# Patient Record
Sex: Female | Born: 1995 | Race: White | Hispanic: No | Marital: Married | Smoking: Never smoker
Health system: Southern US, Community
[De-identification: ages and names within clinical notes are randomized; demographics above are authoritative.]

## PROBLEM LIST (undated history)

## (undated) DIAGNOSIS — Z8759 Personal history of other complications of pregnancy, childbirth and the puerperium: Secondary | ICD-10-CM

## (undated) DIAGNOSIS — Z8616 Personal history of COVID-19: Secondary | ICD-10-CM

## (undated) DIAGNOSIS — Z9889 Other specified postprocedural states: Secondary | ICD-10-CM

## (undated) DIAGNOSIS — I10 Essential (primary) hypertension: Secondary | ICD-10-CM

## (undated) DIAGNOSIS — O139 Gestational [pregnancy-induced] hypertension without significant proteinuria, unspecified trimester: Secondary | ICD-10-CM

## (undated) DIAGNOSIS — J4520 Mild intermittent asthma, uncomplicated: Secondary | ICD-10-CM

## (undated) HISTORY — DX: Other specified postprocedural states: Z98.890

## (undated) HISTORY — PX: WISDOM TOOTH EXTRACTION: SHX21

## (undated) HISTORY — PX: TYMPANOSTOMY TUBE PLACEMENT: SHX32

## (undated) HISTORY — DX: Personal history of COVID-19: Z86.16

## (undated) HISTORY — DX: Personal history of other complications of pregnancy, childbirth and the puerperium: Z87.59

## (undated) HISTORY — DX: Mild intermittent asthma, uncomplicated: J45.20

## (undated) HISTORY — DX: Gestational (pregnancy-induced) hypertension without significant proteinuria, unspecified trimester: O13.9

## (undated) HISTORY — DX: Essential (primary) hypertension: I10

---

## 2018-04-08 ENCOUNTER — Encounter: Payer: Self-pay | Admitting: Family Medicine

## 2018-04-08 ENCOUNTER — Ambulatory Visit (INDEPENDENT_AMBULATORY_CARE_PROVIDER_SITE_OTHER): Payer: Managed Care, Other (non HMO) | Admitting: Family Medicine

## 2018-04-08 VITALS — BP 127/81 | HR 73 | Ht 70.0 in | Wt 224.0 lb

## 2018-04-08 DIAGNOSIS — S39012A Strain of muscle, fascia and tendon of lower back, initial encounter: Secondary | ICD-10-CM | POA: Diagnosis not present

## 2018-04-08 DIAGNOSIS — J4520 Mild intermittent asthma, uncomplicated: Secondary | ICD-10-CM | POA: Diagnosis not present

## 2018-04-08 HISTORY — DX: Mild intermittent asthma, uncomplicated: J45.20

## 2018-04-08 MED ORDER — ALBUTEROL SULFATE HFA 108 (90 BASE) MCG/ACT IN AERS: 2.0000 | INHALATION_SPRAY | Freq: Four times a day (QID) | RESPIRATORY_TRACT | 0 refills | Status: AC | PRN

## 2018-04-08 MED ORDER — CYCLOBENZAPRINE HCL 5 MG PO TABS: 5.0000 mg | ORAL_TABLET | Freq: Three times a day (TID) | ORAL | 1 refills | Status: DC | PRN

## 2018-04-08 NOTE — Patient Instructions (Addendum)
Thank you for coming in today. Attend PT.  Use a heating pad and TENS unit.   Recheck if not improving especially in 4 weeks or so.   Schedule a new patient visit with Dr Lyn Hollingshead or Vinetta Bergamo.   Lumbosacral Strain Lumbosacral strain is an injury that causes pain in the lower back (lumbosacral spine). This injury usually occurs from overstretching the muscles or ligaments along your spine. A strain can affect one or more muscles or cord-like tissues that connect bones to other bones (ligaments). What are the causes? This condition may be caused by:  A hard, direct hit (blow) to the back.  Excessive stretching of the lower back muscles. This may result from: ? A fall. ? Lifting something heavy. ? Repetitive movements such as bending or crouching.  What increases the risk? The following factors may increase your risk of getting this condition:  Participating in sports or activities that involve: ? A sudden twist of the back. ? Pushing or pulling motions.  Being overweight or obese.  Having poor strength and flexibility, especially tight hamstrings or weak muscles in the back or abdomen.  Having too much of a curve in the lower back.  Having a pelvis that is tilted forward.  What are the signs or symptoms? The main symptom of this condition is pain in the lower back, at the site of the strain. Pain may extend (radiate) down one or both legs. How is this diagnosed? This condition is diagnosed based on:  Your symptoms.  Your medical history.  A physical exam. ? Your health care provider may push on certain areas of your back to determine the source of your pain. ? You may be asked to bend forward, backward, and side to side to assess the severity of your pain and your range of motion.  Imaging tests, such as: ? X-rays. ? MRI.  How is this treated? Treatment for this condition may include:  Putting heat and cold on the affected area.  Medicines to help relieve pain  and relax your muscles (muscle relaxants).  NSAIDs to help reduce swelling and discomfort.  When your symptoms improve, it is important to gradually return to your normal routine as soon as possible to reduce pain, avoid stiffness, and avoid loss of muscle strength. Generally, symptoms should improve within 6 weeks of treatment. However, recovery time varies. Follow these instructions at home: Managing pain, stiffness, and swelling   If directed, put ice on the injured area during the first 24 hours after your strain. ? Put ice in a plastic bag. ? Place a towel between your skin and the bag. ? Leave the ice on for 20 minutes, 2-3 times a day.  If directed, put heat on the affected area as often as told by your health care provider. Use the heat source that your health care provider recommends, such as a moist heat pack or a heating pad. ? Place a towel between your skin and the heat source. ? Leave the heat on for 20-30 minutes. ? Remove the heat if your skin turns bright red. This is especially important if you are unable to feel pain, heat, or cold. You may have a greater risk of getting burned. Activity  Rest and return to your normal activities as told by your health care provider. Ask your health care provider what activities are safe for you.  Avoid activities that take a lot of energy for as long as told by your health care provider. General instructions  Take over-the-counter and prescription medicines only as told by your health care provider.  Donot drive or use heavy machinery while taking prescription pain medicine.  Do not use any products that contain nicotine or tobacco, such as cigarettes and e-cigarettes. If you need help quitting, ask your health care provider.  Keep all follow-up visits as told by your health care provider. This is important. How is this prevented?  Use correct form when playing sports and lifting heavy objects.  Use good posture when sitting  and standing.  Maintain a healthy weight.  Sleep on a mattress with medium firmness to support your back.  Be safe and responsible while being active to avoid falls.  Do at least 150 minutes of moderate-intensity exercise each week, such as brisk walking or water aerobics. Try a form of exercise that takes stress off your back, such as swimming or stationary cycling.  Maintain physical fitness, including: ? Strength. ? Flexibility. ? Cardiovascular fitness. ? Endurance. Contact a health care provider if:  Your back pain does not improve after 6 weeks of treatment.  Your symptoms get worse. Get help right away if:  Your back pain is severe.  You cannot stand or walk.  You have difficulty controlling when you urinate or when you have a bowel movement.  You feel nauseous or you vomit.  Your feet get very cold.  You have numbness, tingling, weakness, or problems using your arms or legs.  You develop any of the following: ? Shortness of breath. ? Dizziness. ? Pain in your legs. ? Weakness in your buttocks or legs. ? Discoloration of the skin on your toes or legs. This information is not intended to replace advice given to you by your health care provider. Make sure you discuss any questions you have with your health care provider. Document Released: 02/26/2005 Document Revised: 12/07/2015 Document Reviewed: 10/21/2015 Elsevier Interactive Patient Education  [employer].

## 2018-04-08 NOTE — Progress Notes (Signed)
Subjective:    CC: lower back pain  HPI: Carla Hall (maiden name, Larry Sierras) is a 22 year old female, with a past medical history of asthma, who presents today for low back pain since Sunday afternoon. She has a history of occasional lower back pain, for which she goes to the chiropractor once per month. On Sunday afternoon, she was lifting weights at [employer] and hurt her back. She was barely able to stand afterwards. She went to the chiropractor on Monday, and he told her she likely had a muscle strain. The pain is worse when sitting. 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 Her sleep has not been affected by the pain.   She has used iced, heat, and Aleve without relief. She denies radicular symptoms, dysuria, urinary frequency, and changes in bowel movements.  She would also like to establish care here.  Past medical history, Surgical history, Family history not pertinant except as noted below, Social history, Allergies, and medications have been entered into the medical record, reviewed, and no changes needed.   Review of Systems: No headache, visual changes, nausea, vomiting, diarrhea, constipation, dizziness, abdominal pain, skin rash, fevers, chills, night sweats, weight loss, swollen lymph nodes, body aches, joint swelling, muscle aches, chest pain, shortness of breath, mood changes, visual or auditory hallucinations.   Objective:    Vitals:   04/08/18 0859  BP: 127/81  Pulse: 73   General: Well Developed, well nourished, and in no acute distress.  Neuro/Psych: Alert and oriented x3, extra-ocular muscles intact, able to move all 4 extremities, sensation grossly intact. Skin: Warm and dry, no rashes noted.  Respiratory: Not using accessory muscles, speaking in full sentences, trachea midline.  Cardiovascular: Pulses palpable, no extremity edema. Abdomen: Does not appear distended. MSK:  Low back: Tender to  palpation bilaterally near SI joints. Nontender to spinal midline Very limited flexion and extension due to pain. Limited lateral rotation and limited rotational range of motion bilaterally. Intact lower extremity strength throughout.  Reflexes are equal normal bilaterally.  Normal gait.   Impression and Recommendations:    Assessment and Plan: 22 y.o. female with  Lower back pain: Hermine had onset of lower back pain on Sunday after working out. She most likely has lumbosacral strain based on symptoms and physical exam findings. No concern for fractures. Advised pt to attend PT and start a core routine to warm up before doing [employer] work-outs. Also recommended heating pad and TENS unit. Follow-up in ~4 weeks if no improvement.   Asthma: Will refill her albuterol inhaler. Advised pt that she can use this before exercise if needed.  She will schedule establish care visit with Dr. Lyn Hollingshead or Rosita Kea PA-C in the near future.  I sent medical records request to [facility] and have found vaccine history through [facility].  Patient recently was married and her maiden name was Hanes  Orders Placed This Encounter  Procedures  . Ambulatory referral to Physical Therapy    Referral Priority:   Routine    Referral Type:   Physical Medicine    Referral Reason:   Specialty Services Required    Requested Specialty:   Physical Therapy   Meds ordered this encounter  Medications  . albuterol (PROVENTIL HFA;VENTOLIN HFA) 108 (90 Base) MCG/ACT inhaler    Sig: Inhale 2 puffs into the lungs every 6 (six) hours as needed for wheezing or shortness of breath.  Dispense:  1 Inhaler    Refill:  0  . cyclobenzaprine (FLEXERIL) 5 MG tablet    Sig: Take 1-2 tablets (5-10 mg total) by mouth 3 (three) times daily as needed for muscle spasms.    Dispense:  30 tablet    Refill:  1    Discussed warning signs or symptoms. Please see discharge instructions. Patient expresses understanding.  I  personally was present and performed or re-performed the history, physical exam and medical decision-making activities of this service and have verified that the service and findings are accurately documented in the student's note. ___________________________________________ Clementeen Graham M.D., ABFM., CAQSM. Primary Care and Sports Medicine Adjunct Instructor of Family Medicine  [employer]

## 2018-04-15 ENCOUNTER — Ambulatory Visit (INDEPENDENT_AMBULATORY_CARE_PROVIDER_SITE_OTHER): Payer: Managed Care, Other (non HMO) | Admitting: Osteopathic Medicine

## 2018-04-15 ENCOUNTER — Other Ambulatory Visit (HOSPITAL_COMMUNITY)
Admission: RE | Admit: 2018-04-15 | Discharge: 2018-04-15 | Disposition: A | Discharge status: Home / Self Care | Payer: Managed Care, Other (non HMO) | Source: Ambulatory Visit | Attending: Osteopathic Medicine | Admitting: Osteopathic Medicine

## 2018-04-15 ENCOUNTER — Encounter: Payer: Self-pay | Admitting: Osteopathic Medicine

## 2018-04-15 VITALS — BP 131/71 | HR 92 | Temp 98.2°F | Ht 70.0 in | Wt 224.4 lb

## 2018-04-15 DIAGNOSIS — Z124 Encounter for screening for malignant neoplasm of cervix: Secondary | ICD-10-CM | POA: Insufficient documentation

## 2018-04-15 DIAGNOSIS — Z23 Encounter for immunization: Secondary | ICD-10-CM | POA: Diagnosis not present

## 2018-04-15 DIAGNOSIS — Z0000 Encounter for general adult medical examination without abnormal findings: Secondary | ICD-10-CM | POA: Diagnosis not present

## 2018-04-15 NOTE — Patient Instructions (Signed)
General Preventive Care  Most recent routine screening lipids/other labs: ordered. Cholesterol and Diabetes screening usually recommended every few years.   Everyone should have blood pressure checked once per year.   Tobacco: don't! Alcohol: responsible moderation is ok for most adults - if you have concerns about your alcohol intake, please talk to me! Recreational/Illicit Drugs: don't!  Exercise: as tolerated to reduce risk of cardiovascular disease and diabetes. Strength training will also prevent osteoporosis.   Mental health: if need for mental health care (medicines, counseling, other), or concerns about moods, please let me know!   Sexual health: if need for STD testing, or if concerns with libido/pain problems, please let me know! If you need to discuss your birth control options, please let me know!  Vaccines  Flu vaccine:thanks for getting your flu shot!   Shingles vaccine: Shingrix recommended after age 24   Pneumonia vaccines: Prevnar and Pneumovax recommended after age 24  Tetanus booster: Tdap recommended every 10 years - will request records  Cancer screenings   Colon cancer screening: recommended for everyone at age 24, but some folks need a colonoscopy sooner if risk factors   Breast cancer screening: mammogram recommended at age 24 every other year at least, and annually after age 24.   Cervical cancer screening: every 1 to 5 years depending on age and other risk factors. Can stop at age 24 or w/ hysterectomy as long as previous testing was normal.   Lung cancer screening: not needed for non-smokers Infection screenings . HIV & Gonorrhea/Chlamydia: screening as needed . Hepatitis C: recommended for anyone born [id number] - not needed for you!  . TB: certain at-risk populations, or depending on work requirements and/or travel history Other . Bone Density Test: recommended for women at age 10065, men at age 24, sooner depending on risk factors . Advanced Directive:  Living Will and/or Healthcare Power of Attorney recommended for all adults, regardless of age or health!

## 2018-04-15 NOTE — Progress Notes (Signed)
HPI: Carla Hall is a 22 y.o. female who  has a past medical history of Mild intermittent asthma (04/08/2018).  she presents to [facility] today, 04/15/18,  for chief complaint of: New to establish Annual Physical   Patient here for annual physical / wellness exam.  See preventive care reviewed as below.   Additional concerns today include: None    Past medical, surgical, social and family history reviewed:  Patient Active Problem List   Diagnosis Date Noted  . Mild intermittent asthma 04/08/2018    Past Surgical History:  Procedure Laterality Date  . WISDOM TOOTH EXTRACTION      Social History   Tobacco Use  . Smoking status: Never Smoker  . Smokeless tobacco: Never Used  Substance Use Topics  . Alcohol use: Yes    Comment: 1/WK    Family History  Problem Relation Age of Onset  . Diabetes Mellitus I Sister      Current medication list and allergy/intolerance information reviewed:    Current Outpatient Medications  Medication Sig Dispense Refill  . albuterol (PROVENTIL HFA;VENTOLIN HFA) 108 (90 Base) MCG/ACT inhaler Inhale 2 puffs into the lungs every 6 (six) hours as needed for wheezing or shortness of breath. 1 Inhaler 0  . cyclobenzaprine (FLEXERIL) 5 MG tablet Take 1-2 tablets (5-10 mg total) by mouth 3 (three) times daily as needed for muscle spasms. 30 tablet 1   No current facility-administered medications for this visit.     Allergies  Allergen Reactions  . Penicillins       Review of Systems:  Constitutional:  No  fever, no chills, No recent illness, No unintentional weight changes. No significant fatigue.   HEENT: No  headache, no vision change, no hearing change, No sore throat, No  sinus pressure  Cardiac: No  chest pain, No  pressure, No palpitations, No  Orthopnea  Respiratory:  No  shortness of breath. No  Cough  Gastrointestinal: No  abdominal pain, No  nausea, No  vomiting,  No  blood in  stool, No  diarrhea, No  constipation   Musculoskeletal: No new myalgia/arthralgia  Skin: No  Rash, No other wounds/concerning lesions  Genitourinary: No  incontinence, No  abnormal genital bleeding, No abnormal genital discharge  Hem/Onc: No  easy bruising/bleeding, No  abnormal lymph node  Endocrine: No cold intolerance,  No heat intolerance. No polyuria/polydipsia/polyphagia   Neurologic: No  weakness, No  dizziness, No  slurred speech/focal weakness/facial droop  Psychiatric: No  concerns with depression, No  concerns with anxiety, No sleep problems, No mood problems  Exam:  BP 131/71 (BP Location: Left Arm, Patient Position: Sitting, Cuff Size: Normal)   Pulse 92   Temp 98.2 F (36.8 C) (Oral)   Ht 5\' 10"  (1.778 m)   Wt 224 lb 6.4 oz (101.8 kg)   LMP 04/05/2018   BMI 32.20 kg/m   Constitutional: VS see above. General Appearance: alert, well-developed, well-nourished, NAD  Eyes: Normal lids and conjunctive, non-icteric sclera  Ears, Nose, Mouth, Throat: MMM, Normal external inspection ears/nares/mouth/lips/gums. TM normal bilaterally. Pharynx/tonsils no erythema, no exudate. Nasal mucosa normal.   Neck: No masses, trachea midline. No thyroid enlargement. No tenderness/mass appreciated. No lymphadenopathy  Respiratory: Normal respiratory effort. no wheeze, no rhonchi, no rales  Cardiovascular: S1/S2 normal, no murmur, no rub/gallop auscultated. RRR. No lower extremity edema.  Gastrointestinal: Nontender, no masses. No hepatomegaly, no splenomegaly. No hernia appreciated. Bowel sounds normal. Rectal exam deferred.   Musculoskeletal: Gait  normal. No clubbing/cyanosis of digits.   Neurological: Normal balance/coordination. No tremor. No cranial nerve deficit on limited exam. Motor and sensation intact and symmetric. Cerebellar reflexes intact.   Skin: warm, dry, intact. No rash/ulcer. No concerning nevi or subq nodules on limited exam.    Psychiatric: Normal  judgment/insight. Normal mood and affect. Oriented x3.  GYN: No lesions/ulcers to external genitalia, normal urethra, normal vaginal mucosa, physiologic discharge, cervix normal without lesions, uterus not enlarged or tender, adnexa no masses and nontender  BREAST: No rashes/skin changes, normal fibrous breast tissue, no masses or tenderness, normal nipple without discharge, normal axilla       ASSESSMENT/PLAN:   Annual physical exam - Plan: Cytology - PAP, CBC, COMPLETE METABOLIC PANEL WITH GFR, Lipid panel  Need for influenza vaccination - Plan: Flu Vaccine QUAD 6+ mos PF IM (Fluarix Quad PF)  Cervical cancer screening - Plan: Cytology - PAP    Patient Instructions  General Preventive Care  Most recent routine screening lipids/other labs: ordered. Cholesterol and Diabetes screening usually recommended every few years.   Everyone should have blood pressure checked once per year.   Tobacco: don't! Alcohol: responsible moderation is ok for most adults - if you have concerns about your alcohol intake, please talk to me! Recreational/Illicit Drugs: don't!  Exercise: as tolerated to reduce risk of cardiovascular disease and diabetes. Strength training will also prevent osteoporosis.   Mental health: if need for mental health care (medicines, counseling, other), or concerns about moods, please let me know!   Sexual health: if need for STD testing, or if concerns with libido/pain problems, please let me know! If you need to discuss your birth control options, please let me know!  Vaccines  Flu vaccine:thanks for getting your flu shot!   Shingles vaccine: Shingrix recommended after age 24   Pneumonia vaccines: Prevnar and Pneumovax recommended after age 24  Tetanus booster: Tdap recommended every 10 years - will request records  Cancer screenings   Colon cancer screening: recommended for everyone at age 24, but some folks need a colonoscopy sooner if risk factors   Breast  cancer screening: mammogram recommended at age 24 every other year at least, and annually after age 24.   Cervical cancer screening: every 1 to 5 years depending on age and other risk factors. Can stop at age 24 or w/ hysterectomy as long as previous testing was normal.   Lung cancer screening: not needed for non-smokers Infection screenings . HIV & Gonorrhea/Chlamydia: screening as needed . Hepatitis C: recommended for anyone born [id number] - not needed for you!  . TB: certain at-risk populations, or depending on work requirements and/or travel history Other . Bone Density Test: recommended for women at age 24, men at age 24, sooner depending on risk factors . Advanced Directive: Living Will and/or Healthcare Power of Attorney recommended for all adults, regardless of age or health!     Visit summary with medication list and pertinent instructions was printed for patient to review. All questions at time of visit were answered - patient instructed to contact office with any additional concerns. ER/RTC precautions were reviewed with the patient.   Follow-up plan: Return in about 1 year (around 04/16/2019), or sooner if needed , for [employer] [facility].   Please note: voice recognition software was used to produce this document, and typos may escape review. Please contact Dr. Lyn HollingsheadAlexander for any needed clarifications.

## 2018-04-16 ENCOUNTER — Encounter: Payer: Self-pay | Admitting: Rehabilitative and Restorative Service Providers"

## 2018-04-16 ENCOUNTER — Ambulatory Visit: Payer: Managed Care, Other (non HMO) | Admitting: Rehabilitative and Restorative Service Providers"

## 2018-04-16 DIAGNOSIS — M545 Low back pain, unspecified: Secondary | ICD-10-CM

## 2018-04-16 DIAGNOSIS — R29898 Other symptoms and signs involving the musculoskeletal system: Secondary | ICD-10-CM | POA: Diagnosis not present

## 2018-04-16 LAB — CYTOLOGY - PAP: DIAGNOSIS: NEGATIVE

## 2018-04-16 NOTE — Patient Instructions (Addendum)
Abdominal Bracing With Pelvic Floor (Hook-Lying)    With neutral spine, tighten pelvic floor and abdominals sucking belly button to back bone; tighten muscles in the low back at waist; exhale slowly to the count of 10 Repeat _10__ times. Do _several __ times a day.   Cat / Cow Flow    Inhale, press spine toward ceiling like a Halloween cat. Keeping strength in arms and abdominals, exhale to soften spine through neutral and into cow pose. Open chest and arch back. Initiate movement between cat and cow at tailbone, one vertebrae at a time. Repeat __3-5 __ times.   BACK: Child's Pose (Sciatica)    Sit in knee-chest position and reach arms forward. Separate knees for comfort. Hold position for _30 sec 3__ times. Do _2__ times per day.   Side Waist Stretch from Child's Pose    From child's pose, walk hands to left. Reach right hand out on diagonal. Reach hips back toward heels making a C with torso. Breathe into right side waist. Hold for _20-30 seconds. Repeat __2-3__ times each side.   Bridge  NO PAIN     Tighten core. Focus on engaging posterior hip muscles. Hold for _5 seconds. Repeat __5 __ times.   Side Bend, Sitting FEET ON FLOOR    Sit cross-legged on floor. Bend to one side, touching elbow to floor. Hold _20-30__ seconds. To increase stretch, raise other arm above head.  Repeat _3__ times per session. Do _2-3__ sessions per day.   TENS UNIT: This is helpful for muscle pain and spasm.   Search and Purchase a TENS 7000 2nd edition at www.tenspros.com. It should be less than $30.     TENS unit instructions: Do not shower or bathe with the unit on Turn the unit off before removing electrodes or batteries If the electrodes lose stickiness add a drop of water to the electrodes after they are disconnected from the unit and place on plastic sheet. If you continued to have difficulty, call the TENS unit company to purchase more electrodes. Do not apply lotion  on the skin area prior to use. Make sure the skin is clean and dry as this will help prolong the life of the electrodes. After use, always check skin for unusual red areas, rash or other skin difficulties. If there are any skin problems, does not apply electrodes to the same area. Never remove the electrodes from the unit by pulling the wires. Do not use the TENS unit or electrodes other than as directed. Do not change electrode placement without consultating your therapist or physician. Keep 2 fingers with between each electrode.   Sleeping on Back  Place pillow under knees. A pillow with cervical support and a roll around waist are also helpful. Copyright  VHI. All rights reserved.  Sleeping on Side Place pillow between knees. Use cervical support under neck and a roll around waist as needed. Copyright  VHI. All rights reserved.   Sleeping on Stomach   If this is the only desirable sleeping position, place pillow under lower legs, and under stomach or chest as needed.  Posture - Sitting   Sit upright, head facing forward. Try using a roll to support lower back. Keep shoulders relaxed, and avoid rounded back. Keep hips level with knees. Avoid crossing legs for long periods. Stand to Sit / Sit to Stand   To sit: Bend knees to lower self onto front edge of chair, then scoot back on seat. To stand: Reverse sequence by  placing one foot forward, and scoot to front of seat. Use rocking motion to stand up.   Work Height and Reach  Ideal work height is no more than 2 to 4 inches below elbow level when standing, and at elbow level when sitting. Reaching should be limited to arm's length, with elbows slightly bent.  Bending  Bend at hips and knees, not back. Keep feet shoulder-width apart.    Posture - Standing   Good posture is important. Avoid slouching and forward head thrust. Maintain curve in low back and align ears over shoul- ders, hips over ankles.  Alternating  Positions   Alternate tasks and change positions frequently to reduce fatigue and muscle tension. Take rest breaks. Computer Work   Position work to Art gallery manager. Use proper work and seat height. Keep shoulders back and down, wrists straight, and elbows at right angles. Use chair that provides full back support. Add footrest and lumbar roll as needed.  Getting Into / Out of Car  Lower self onto seat, scoot back, then bring in one leg at a time. Reverse sequence to get out.  Dressing  Lie on back to pull socks or slacks over feet, or sit and bend leg while keeping back straight.    Housework - Sink  Place one foot on ledge of cabinet under sink when standing at sink for prolonged periods.   Pushing / Pulling  Pushing is preferable to pulling. Keep back in proper alignment, and use leg muscles to do the work.  Deep Squat   Squat and lift with both arms held against upper trunk. Tighten stomach muscles without holding breath. Use smooth movements to avoid jerking.  Avoid Twisting   Avoid twisting or bending back. Pivot around using foot movements, and bend at knees if needed when reaching for articles.  Carrying Luggage   Distribute weight evenly on both sides. Use a cart whenever possible. Do not twist trunk. Move body as a unit.   Lifting Principles .Maintain proper posture and head alignment. .Slide object as close as possible before lifting. .Move obstacles out of the way. .Test before lifting; ask for help if too heavy. .Tighten stomach muscles without holding breath. .Use smooth movements; do not jerk. .Use legs to do the work, and pivot with feet. .Distribute the work load symmetrically and close to the center of trunk. .Push instead of pull whenever possible.   Ask For Help   Ask for help and delegate to others when possible. Coordinate your movements when lifting together, and maintain the low back curve.  Log Roll   Lying on back, bend left knee  and place left arm across chest. Roll all in one movement to the right. Reverse to roll to the left. Always move as one unit. Housework - Sweeping  Use long-handled equipment to avoid stooping.   Housework - Wiping  Position yourself as close as possible to reach work surface. Avoid straining your back.  Laundry - Unloading Wash   To unload small items at bottom of washer, lift leg opposite to arm being used to reach.  Gardening - Raking  Move close to area to be raked. Use arm movements to do the work. Keep back straight and avoid twisting.     Cart  When reaching into cart with one arm, lift opposite leg to keep back straight.   Getting Into / Out of Bed  Lower self to lie down on one side by raising legs and lowering head at the same  time. Use arms to assist moving without twisting. Bend both knees to roll onto back if desired. To sit up, start from lying on side, and use same move-ments in reverse. Housework - Vacuuming  Hold the vacuum with arm held at side. Step back and forth to move it, keeping head up. Avoid twisting.   Laundry - Armed forces training and education officer so that bending and twisting can be avoided.   Laundry - Unloading Dryer  Squat down to reach into clothes dryer or use a reacher.  Gardening - Weeding / Psychiatric nurse or Kneel. Knee pads may be helpful.

## 2018-04-16 NOTE — Therapy (Addendum)
[facility] [place], [place], [address] Phone: [phone]   Fax:  [phone]  Physical Therapy Evaluation  Patient Details  Name: Carla Hall MRN: [id number] Date of Birth: 15-Sep-1995 Referring Provider (PT): Dr Georgina Snell    Encounter Date: 04/16/2018  PT End of Session - 04/16/18 1552    Visit Number  1    Number of Visits  6    Date for PT Re-Evaluation  05/28/18    PT Start Time  1450    PT Stop Time  1558    PT Time Calculation (min)  68 min    Activity Tolerance  Patient tolerated treatment well       Past Medical History:  Diagnosis Date  . H/O tympanostomy   . Mild intermittent asthma 04/08/2018    Past Surgical History:  Procedure Laterality Date  . WISDOM TOOTH EXTRACTION      There were no vitals filed for this visit.   Subjective Assessment - 04/16/18 1454    Subjective  Patient reports that she noticed some LBP 8/19 with no known injury. She was seen by the chiropractor a few visits with some resolution. She improved initially but had recurrent intemse pain ~ 2 weeks ago after lifting in the gym. She was seen by the chiropractor with no significant improvement. Patient was seen by MD and treated with muscle relatant with some improvement.     Pertinent History  asthma    Diagnostic tests  xrays (-)     Patient Stated Goals  rid of the pain and learn exercises     Currently in Pain?  Yes    Pain Score  5     Pain Location  Back    Pain Orientation  Left;Right;Lower   Lt>Rt   Pain Descriptors / Indicators  Sharp    Pain Type  Acute pain    Pain Onset  1 to 4 weeks ago    Pain Frequency  Intermittent    Aggravating Factors   sitting up straight; lifting leg to dress    Pain Relieving Factors  meds; heating pad; lying down;; stretching          [facility] PT Assessment - 04/16/18 0001      Assessment   Medical Diagnosis  LBP     Referring Provider (PT)  Dr Georgina Snell     Onset  Date/Surgical Date  04/04/18    Hand Dominance  Right    Next MD Visit  PRN     Prior Therapy  chiropractic care       Precautions   Precautions  None      Balance Screen   Has the patient fallen in the past 6 months  No    Has the patient had a decrease in activity level because of a fear of falling?   No    Is the patient reluctant to leave their home because of a fear of falling?   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     Leisure  exercise - cross fit ~ 6 wks before injury; walking dog daily; household chores - some TV       Observation/Other Assessments   Focus on Therapeutic Outcomes (FOTO)   40% limitation       Sensation  Additional Comments  WFL's per pt report       AROM   Lumbar Flexion  50% with discomfort     Lumbar Extension  50% with discomfort     Lumbar - Right Side Bend  80%    Lumbar - Left Side Bend  80%    Lumbar - Right Rotation  75%    Lumbar - Left Rotation  75%      Strength   Overall Strength Comments  WNL's bilat LE's-  core weakness       Flexibility   Hamstrings  tight Lt > Rt    Quadriceps  tight Rt > Lt    ITB  WFL    Piriformis  tight Lt > Rt       Palpation   Palpation comment  muscular tightness through the QL/lumbar paraspinals/lats Lt > Rt                 Objective measurements completed on examination: See above findings.      [facility] PT Treatment/Exercise - 04/16/18 0001      Self-Care   Self-Care  --   initiated back care education      Therapeutic Activites    Therapeutic Activities  --   education in transfers      Lumbar Exercises: Stretches   Double Knee to Chest Stretch  3 reps;30 seconds    Hip Flexor Stretch  Right;Left;1 rep;30 seconds    Other Lumbar Stretch Exercise  cat/cow x 5; child's pose x 3; side stretch from child's pose x 3 each side    Other Lumbar Stretch  Exercise  sitting lateral trunk rotation       Lumbar Exercises: Supine   Bridge  5 reps   some discomfort - engaged core    Other Supine Lumbar Exercises  4 part core 10 sec x 10       Moist Heat Therapy   Number Minutes Moist Heat  20 Minutes    Moist Heat Location  Lumbar Spine      Electrical Stimulation   Electrical Stimulation Location  bilat lumbar    Electrical Stimulation Action  IFC    Electrical Stimulation Parameters  to tolerance    Electrical Stimulation Goals  Pain;Tone             PT Education - 04/16/18 1533    Education Details  HEP TENS back care     Person(s) Educated  Patient    Methods  Explanation;Demonstration;Tactile cues;Verbal cues;Handout    Comprehension  Verbalized understanding;Returned demonstration;Verbal cues required;Tactile cues required          PT Long Term Goals - 04/16/18 1554      PT LONG TERM GOAL #1   Title  Decrease LBP by 75-100% allowiing patient to return to exercise program 05/28/18    Time  6    Period  Weeks    Status  New      PT LONG TERM GOAL #2   Title  Increase trunk ROM to WFL's in flexion/extension 05/28/18    Time  6    Period  Weeks    Status  New      PT LONG TERM GOAL #3   Title  Progress patient to appropriate strengthening/core stabilization program 05/28/18    Time  6    Period  Weeks      PT LONG TERM GOAL #4   Title  Improve FOTO to </= 19% limitation  05/28/18    Time  6    Period  Weeks    Status  New             Plan - 04/16/18 1552    Clinical Impression Statement  Patient presents with lumbar strain following cross fit work out. She has limted trunk mobility; muscular tightness to palpation; weak core. She will benefit from PT to address problems identified.     Clinical Presentation  Stable    Clinical Decision Making  Low    Rehab Potential  Good    PT Frequency  1x / week    PT Duration  6 weeks    PT Treatment/Interventions  Patient/family education;ADLs/Self Care Home  Management;Cryotherapy;Electrical Stimulation;Iontophoresis '4mg'$ /ml Dexamethasone;Moist Heat;Ultrasound;Dry needling;Manual techniques;Neuromuscular re-education;Therapeutic activities;Therapeutic exercise    PT Next Visit Plan  to avoid standing with knees hyperextended; review HEP; add core strengthening/stabilization; manual work and modalities as indicated     Oncologist with Plan of Care  Patient       Patient will benefit from skilled therapeutic intervention in order to improve the following deficits and impairments:  Postural dysfunction, Pain, Improper body mechanics, Increased muscle spasms, Decreased strength, Decreased range of motion, Decreased mobility, Decreased activity tolerance  Visit Diagnosis: Acute bilateral low back pain without sciatica - Plan: PT plan of care cert/re-cert  Other symptoms and signs involving the musculoskeletal system - Plan: PT plan of care cert/re-cert     Problem List Patient Active Problem List   Diagnosis Date Noted  . Mild intermittent asthma 04/08/2018    Seanne Chirico Nilda Simmer PT, MPH  04/16/2018, 3:58 PM  [facility] [place], [place], [address] Phone: [phone]   Fax:  [phone]  Name: PAGE PUCCIARELLI MRN: [id number] Date of Birth: 01-Feb-1996  PHYSICAL THERAPY DISCHARGE SUMMARY  Visits from Start of Care: Evaluation only  Current functional level related to goals / functional outcomes: See evaluation    Remaining deficits: Unknown    Education / Equipment: Initial HEP  Plan: Patient agrees to discharge.  Patient goals were not met. Patient is being discharged due to not returning since the last visit.  ?????     Eara Burruel P. Helene Kelp PT, MPH 06/11/18 9:31 AM

## 2018-04-19 ENCOUNTER — Telehealth: Payer: Self-pay | Admitting: Osteopathic Medicine

## 2018-04-19 NOTE — Telephone Encounter (Signed)
We got vaccine records from her school.  Looks like tetanus was updated September 2016.  Not due for anything else right now.  Will copy these vaccine records into her chart here.

## 2018-04-19 NOTE — Telephone Encounter (Signed)
Vaccine records verified and updated in Epic.

## 2018-06-10 ENCOUNTER — Telehealth: Payer: Self-pay | Admitting: Family Medicine

## 2018-06-10 NOTE — Telephone Encounter (Signed)
During my acute visit for back pain I noted that Carla Hall did not have record of Pap smear.  I requested medical records from [facility].  Pap smear was not sent over however patient has already had a Pap smear in the interim.  I did receive general medical records from her previous primary care doctor.  Notes will be given to PCP.

## 2018-09-07 ENCOUNTER — Encounter (INDEPENDENT_AMBULATORY_CARE_PROVIDER_SITE_OTHER): Payer: Managed Care, Other (non HMO) | Admitting: Osteopathic Medicine

## 2018-09-07 DIAGNOSIS — L550 Sunburn of first degree: Secondary | ICD-10-CM

## 2018-09-07 MED ORDER — DICLOFENAC SODIUM 1 % TD GEL: 4.0000 g | Freq: Four times a day (QID) | TRANSDERMAL | 11 refills | Status: DC

## 2018-11-29 ENCOUNTER — Encounter: Payer: Managed Care, Other (non HMO) | Admitting: Osteopathic Medicine

## 2019-03-23 ENCOUNTER — Telehealth: Payer: Self-pay | Admitting: Osteopathic Medicine

## 2019-03-23 ENCOUNTER — Other Ambulatory Visit: Payer: Self-pay

## 2019-03-23 DIAGNOSIS — Z0000 Encounter for general adult medical examination without abnormal findings: Secondary | ICD-10-CM

## 2019-03-23 NOTE — Telephone Encounter (Signed)
Annual routine labs ordered for pt (cmp/cbc/lipid panel). Pls update the pt. Thanks.

## 2019-03-23 NOTE — Telephone Encounter (Signed)
Patient needs labs sent in for virtual physical

## 2019-03-23 NOTE — Progress Notes (Signed)
Pt did not complete annual routine labs ordered on 04/15/18. Labs re-ordered.

## 2019-04-07 ENCOUNTER — Encounter: Payer: Self-pay | Admitting: Osteopathic Medicine

## 2019-04-14 ENCOUNTER — Ambulatory Visit (INDEPENDENT_AMBULATORY_CARE_PROVIDER_SITE_OTHER): Payer: Managed Care, Other (non HMO) | Admitting: Osteopathic Medicine

## 2019-04-14 ENCOUNTER — Other Ambulatory Visit: Payer: Self-pay

## 2019-04-14 ENCOUNTER — Encounter: Payer: Self-pay | Admitting: Osteopathic Medicine

## 2019-04-14 VITALS — BP 125/85 | HR 97 | Temp 99.0°F | Wt 210.0 lb

## 2019-04-14 DIAGNOSIS — Z0000 Encounter for general adult medical examination without abnormal findings: Secondary | ICD-10-CM

## 2019-04-14 DIAGNOSIS — J4520 Mild intermittent asthma, uncomplicated: Secondary | ICD-10-CM

## 2019-04-14 DIAGNOSIS — Z23 Encounter for immunization: Secondary | ICD-10-CM

## 2019-04-14 NOTE — Progress Notes (Signed)
HPI: Carla Hall is a 23 y.o. female who  has a past medical history of H/O tympanostomy and Mild intermittent asthma (04/08/2018).  she presents to [facility] today, 04/14/19,  for chief complaint of: Annual physical     Patient here for annual physical / wellness exam.  See preventive care reviewed as below.    Additional concerns today include:  Needs form filled out, she and her husband will be taking on some foster children, they are expecting to be approved around the end of December and possibly getting kids placed with them in January.     Past medical, surgical, social and family history reviewed:  Patient Active Problem List   Diagnosis Date Noted  . Mild intermittent asthma 04/08/2018    Past Surgical History:  Procedure Laterality Date  . WISDOM TOOTH EXTRACTION      Social History   Tobacco Use  . Smoking status: Never Smoker  . Smokeless tobacco: Never Used  Substance Use Topics  . Alcohol use: Yes    Comment: 1/WK    Family History  Problem Relation Age of Onset  . Diabetes Mellitus I Sister      Current medication list and allergy/intolerance information reviewed:    Current Outpatient Medications  Medication Sig Dispense Refill  . albuterol (PROVENTIL HFA;VENTOLIN HFA) 108 (90 Base) MCG/ACT inhaler Inhale 2 puffs into the lungs every 6 (six) hours as needed for wheezing or shortness of breath. 1 Inhaler 0  . cyclobenzaprine (FLEXERIL) 5 MG tablet Take 1-2 tablets (5-10 mg total) by mouth 3 (three) times daily as needed for muscle spasms. 30 tablet 1  . diclofenac sodium (VOLTAREN) 1 % GEL Apply 4 g topically 4 (four) times daily. To affected skin. 100 g 11   No current facility-administered medications for this visit.     Allergies  Allergen Reactions  . Penicillins       Review of Systems:  Constitutional:  No  fever, no chills, No recent illness, No unintentional weight changes. No significant  fatigue.   HEENT: No  headache, no vision change, no hearing change, No sore throat, No  sinus pressure  Cardiac: No  chest pain, No  pressure, No palpitations, No  Orthopnea  Respiratory:  No  shortness of breath. No  Cough  Gastrointestinal: No  abdominal pain, No  nausea, No  vomiting,  No  blood in stool, No  diarrhea, No  constipation   Musculoskeletal: No new myalgia/arthralgia  Skin: No  Rash, No other wounds/concerning lesions  Genitourinary: No  incontinence, No  abnormal genital bleeding, No abnormal genital discharge  Hem/Onc: No  easy bruising/bleeding, No  abnormal lymph node  Endocrine: No cold intolerance,  No heat intolerance. No polyuria/polydipsia/polyphagia   Neurologic: No  weakness, No  dizziness, No  slurred speech/focal weakness/facial droop  Psychiatric: No  concerns with depression, No  concerns with anxiety, No sleep problems, No mood problems  Exam:  BP 125/85 (BP Location: Left Arm, Patient Position: Sitting, Cuff Size: Normal)   Pulse 97   Temp 99 F (37.2 C) (Oral)   Wt 210 lb 0.6 oz (95.3 kg)   BMI 30.14 kg/m   Constitutional: VS see above. General Appearance: alert, well-developed, well-nourished, NAD  Eyes: Normal lids and conjunctive, non-icteric sclera  Neck: No masses, trachea midline. No thyroid enlargement. No tenderness/mass appreciated. No lymphadenopathy  Respiratory: Normal respiratory effort. no wheeze, no rhonchi, no rales  Cardiovascular: S1/S2 normal, no murmur, no rub/gallop  auscultated. RRR. No lower extremity edema.   Gastrointestinal: Nontender, no masses. No hepatomegaly, no splenomegaly. No hernia appreciated. Bowel sounds normal. Rectal exam deferred.   Musculoskeletal: Gait normal. No clubbing/cyanosis of digits.   Neurological: Normal balance/coordination. No tremor. No cranial nerve deficit on limited exam. Motor and sensation intact and symmetric. Cerebellar reflexes intact.   Skin: warm, dry, intact. No  rash/ulcer.   Psychiatric: Normal judgment/insight. Normal mood and affect. Oriented x3.    No results found for this or any previous visit (from the past 72 hour(s)).  No results found.   ASSESSMENT/PLAN: The primary encounter diagnosis was Annual physical exam. Diagnoses of Need for influenza vaccination and Mild intermittent asthma without complication were also pertinent to this visit.   Filled out form.  Patient does not have risk factors requiring TB testing.  Only other chronic condition is mild intermittent asthma, she very rarely needs albuterol, cannot recall the last time that she used this inhaler.  Orders Placed This Encounter  Procedures  . Flu Vaccine QUAD 6+ mos PF IM (Fluarix Quad PF)    No orders of the defined types were placed in this encounter.   Patient Instructions  General Preventive Care  Most recent routine screening lipids/other labs: ordered. Cholesterol and Diabetes screening usually recommended every few years.   Everyone should have blood pressure checked once per year.   Tobacco: don't! Alcohol: responsible moderation is ok for most adults - if you have concerns about your alcohol intake, please talk to me!   Exercise: as tolerated to reduce risk of cardiovascular disease and diabetes. Strength training will also prevent osteoporosis.   Mental health: if need for mental health care (medicines, counseling, other), or concerns about moods, please let me know!   Sexual health: if need for STD testing, or if concerns with libido/pain problems, please let me know! If you need to discuss your birth control options, please let me know!  Vaccines  Flu vaccine:thanks for getting your flu shot!   Shingles vaccine: Shingrix recommended after age 18   Pneumonia vaccines: recommended after age 3  Tetanus booster: Tdap due 2026 Cancer screenings   Colon cancer screening: recommended for everyone at age 35  Breast cancer screening: mammogram  recommended at age 73  Cervical cancer screening: Pap due 2022  Lung cancer screening: not needed for non-smokers Infection screenings  HIV & Gonorrhea/Chlamydia: screening as needed  Hepatitis C: recommended for anyone born 16-1967 - not needed for you!   TB: certain at-risk populations, or depending on work requirements and/or travel history Other  Bone Density Test: recommended for women at age 42  Advanced Directive: Living Will and/or Healthcare Power of Attorney recommended for all adults, regardless of age or health!      Immunization History  Administered Date(s) Administered  . DTaP 09/02/1993, 11/08/1993, 01/15/1994, 01/23/1995, 08/14/2006  . HPV Quadrivalent 01/02/2009, 01/04/2010, 01/16/2011  . Hepatitis A 01/02/2009, 01/04/2010  . Hepatitis B 09[birth year]21[birth year]93, 09/02/1993, 11/08/1993  . HiB (PRP-OMP) 09/02/1993, 11/05/1993, 01/15/1994, 01/22/1995  . Hpv 01/02/2009, 01/04/2010, 01/16/2011  . IPV 09/02/1993, 11/08/1993, 01/10/1994, 01/11/1995  . Influenza,inj,Quad PF,6+ Mos 04/15/2018, 04/14/2019  . Influenza-Unspecified 03/28/2010, 02/26/2011  . MMR 09/15/1995, 08/14/1998  . Meningococcal Conjugate 01/02/2009  . Meningococcal Polysaccharide 10/06/2016  . Td 08/14/2006  . Tdap 02/09/2015  . Typhoid Parenteral 10/06/2016  . Varicella 08/19/1994      Visit summary with medication list and pertinent instructions was printed for patient to review. All questions at time of visit were answered -  patient instructed to contact office with any additional concerns or updates. ER/RTC precautions were reviewed with the patient.      Please note: voice recognition software was used to produce this document, and typos may escape review. Please contact Dr. Sheppard Coil for any needed clarifications.     Follow-up plan: Return in about 1 year (around 04/13/2020) for Otis (call week prior to visit for lab orders).

## 2019-04-14 NOTE — Patient Instructions (Addendum)
General Preventive Care  Most recent routine screening lipids/other labs: ordered. Cholesterol and Diabetes screening usually recommended every few years.   Everyone should have blood pressure checked once per year.   Tobacco: don't! Alcohol: responsible moderation is ok for most adults - if you have concerns about your alcohol intake, please talk to me!   Exercise: as tolerated to reduce risk of cardiovascular disease and diabetes. Strength training will also prevent osteoporosis.   Mental health: if need for mental health care (medicines, counseling, other), or concerns about moods, please let me know!   Sexual health: if need for STD testing, or if concerns with libido/pain problems, please let me know! If you need to discuss your birth control options, please let me know!  Vaccines  Flu vaccine:thanks for getting your flu shot!   Shingles vaccine: Shingrix recommended after age 55   Pneumonia vaccines: recommended after age 98  Tetanus booster: Tdap due 2026 Cancer screenings   Colon cancer screening: recommended for everyone at age 23  Breast cancer screening: mammogram recommended at age 44  Cervical cancer screening: Pap due 2022  Lung cancer screening: not needed for non-smokers Infection screenings  HIV & Gonorrhea/Chlamydia: screening as needed  Hepatitis C: recommended for anyone born 88-1967 - not needed for you!   TB: certain at-risk populations, or depending on work requirements and/or travel history Other  Bone Density Test: recommended for women at age 52  Advanced Directive: Living Will and/or Healthcare Power of Attorney recommended for all adults, regardless of age or health!

## 2019-05-05 ENCOUNTER — Encounter: Payer: Self-pay | Admitting: Osteopathic Medicine

## 2019-06-01 ENCOUNTER — Encounter: Payer: Self-pay | Admitting: Osteopathic Medicine

## 2019-06-03 NOTE — L&D Delivery Note (Addendum)
OB/GYN Faculty Practice Delivery Note  Carla Hall is a 24 y.o. G1P0000 s/p VD (IOL for gHTN -> preE w SF) at [redacted]w[redacted]d.  ROM: 14h 45m with 781 fluid GBS Status:   negative Maximum Maternal Temperature: 99.5 F  Labor Progress: Patient presented to L&D for IOL. Labor course was complicated by preE with SFs + PC. Patient augmented with cytotec, pitocin. She then progressed to complete.   Delivery Date/Time: 03/02/20 @ 0534 Delivery: Called to room and patient was complete and pushing. Head position was vertex and delivered with ease over the perineum. No nuchal cord present. Shoulder and body delivered in usual fashion. Infant with spontaneous cry, placed on mother's abdomen, dried and stimulated. Cord clamped x 2 after 1-minute delay, and cut by FOB. Cord blood drawn. Placenta delivered spontaneously with gentle cord traction. Fundus firm with massage and pitocin started. Labia, perineum, vagina, and cervix inspected and significant for 2nd degree perineal tear.  Baby Weight: per chart  Cord: central insertion, 3 vessel Placenta: Sent to L&D, intact Complications: None Lacerations: 2nd degree perineal tear, and was repaired in the standard fashion EBL: 781 cc Analgesia: Epidural   Infant: APGAR (1 MIN): 8   APGAR (5 MINS): 9   Herby Abraham MD, PGY-1 OBGYN Faculty Teaching Service  03/02/2020, 6:37 AM  The above was performed under my direct supervision and guidance.  Jacklyn Shell

## 2019-07-19 ENCOUNTER — Encounter: Payer: Self-pay | Admitting: Osteopathic Medicine

## 2019-08-11 ENCOUNTER — Ambulatory Visit (INDEPENDENT_AMBULATORY_CARE_PROVIDER_SITE_OTHER): Payer: PRIVATE HEALTH INSURANCE | Admitting: Certified Nurse Midwife

## 2019-08-11 ENCOUNTER — Other Ambulatory Visit: Payer: Self-pay

## 2019-08-11 ENCOUNTER — Encounter: Payer: Self-pay | Admitting: Certified Nurse Midwife

## 2019-08-11 VITALS — BP 127/75 | HR 98 | Temp 98.4°F | Resp 16 | Ht 70.0 in | Wt 215.0 lb

## 2019-08-11 DIAGNOSIS — R309 Painful micturition, unspecified: Secondary | ICD-10-CM

## 2019-08-11 LAB — POCT URINALYSIS DIPSTICK
Bilirubin, UA: NEGATIVE
Blood, UA: NEGATIVE
Glucose, UA: NEGATIVE
Ketones, UA: NEGATIVE
Leukocytes, UA: NEGATIVE
Nitrite, UA: NEGATIVE
Protein, UA: NEGATIVE
Spec Grav, UA: <=1.005 — AB (ref 1.010–1.025)
Urobilinogen, UA: NEGATIVE E.U./dL — AB
pH, UA: 7 (ref 5.0–8.0)

## 2019-08-11 NOTE — Patient Instructions (Signed)

## 2019-08-11 NOTE — Progress Notes (Signed)
Pt here with c/o's insomnia.  She will have NOB 08/26/19.

## 2019-08-11 NOTE — Progress Notes (Signed)
   OBGYN OFFICE VISIT NOTE  History:  24 y.o. G1P0000 @[redacted]w[redacted]d  presenting with insomnia and urinary c/o. Reports 1 month of insomnia. She is able to fall asleep but can't stay asleep. She has tried Benadryl and low stimulation before bed. She also reports cluoudy urine. Denies dysuria, frequency, and hematuria. She started to drink more water and it improved.   Past Medical History:  Diagnosis Date  . H/O tympanostomy   . History of COVID-19   . Mild intermittent asthma 04/08/2018    Past Surgical History:  Procedure Laterality Date  . WISDOM TOOTH EXTRACTION      The following portions of the patient's history were reviewed and updated as appropriate: allergies, current medications, past family history, past medical history, past social history, past surgical history and problem list.   Review of Systems:  Genito-Urinary ROS: no dysuria, trouble voiding, or hematuria +sleep disturbance  Objective:  Physical Exam BP 127/75   Pulse 98   Temp 98.4 F (36.9 C)   Resp 16   Ht 5\' 10"  (1.778 m)   Wt 215 lb (97.5 kg)   LMP 06/15/2019   BMI 30.85 kg/m  CONSTITUTIONAL: Well-developed, well-nourished female in no acute distress.  HENT:  Normocephalic, atraumatic EYES: Conjunctivae and EOM are normal NECK: Normal range of motion SKIN: Skin is warm and dry NEUROLOGIC: Alert and oriented to person, place, and time PSYCHIATRIC: Normal mood and affect CARDIOVASCULAR: Normal heart rate noted RESPIRATORY: Effort and rate normal MUSCULOSKELETAL: Normal range of motion  Labs and Imaging Results for orders placed or performed in visit on 08/11/19 (from the past 24 hour(s))  POCT Urinalysis Dipstick     Status: Abnormal   Collection Time: 08/11/19  3:44 PM  Result Value Ref Range   Color, UA yellow    Clarity, UA clear    Glucose, UA Negative Negative   Bilirubin, UA neg    Ketones, UA neg    Spec Grav, UA <=1.005 (A) 1.010 - 1.025   Blood, UA neg    pH, UA 7.0 5.0 - 8.0   Protein,  UA Negative Negative   Urobilinogen, UA negative (A) 0.2 or 1.0 E.U./dL   Nitrite, UA neg    Leukocytes, UA Negative Negative   Appearance clear    Odor none    Assessment & Plan:  1. Insomnia - eat snack with protein before bed -Unisom tablet at hs, start with 1/2 tab  2. Cloudy urine - UA and UC ordered  Follow up in 2 weeks for NOB   10/11/19, CNM 08/11/2019 3:58 PM

## 2019-08-13 LAB — URINE CULTURE, OB REFLEX

## 2019-08-13 LAB — CULTURE, OB URINE

## 2019-08-24 ENCOUNTER — Encounter: Payer: Self-pay | Admitting: *Deleted

## 2019-08-26 ENCOUNTER — Other Ambulatory Visit: Payer: Self-pay

## 2019-08-26 ENCOUNTER — Encounter: Payer: Self-pay | Admitting: Certified Nurse Midwife

## 2019-08-26 ENCOUNTER — Ambulatory Visit (INDEPENDENT_AMBULATORY_CARE_PROVIDER_SITE_OTHER): Payer: PRIVATE HEALTH INSURANCE | Admitting: Certified Nurse Midwife

## 2019-08-26 VITALS — BP 136/85 | HR 101 | Temp 98.7°F | Wt 219.0 lb

## 2019-08-26 DIAGNOSIS — Z3687 Encounter for antenatal screening for uncertain dates: Secondary | ICD-10-CM | POA: Diagnosis not present

## 2019-08-26 DIAGNOSIS — Z3401 Encounter for supervision of normal first pregnancy, first trimester: Secondary | ICD-10-CM | POA: Insufficient documentation

## 2019-08-26 DIAGNOSIS — O09291 Supervision of pregnancy with other poor reproductive or obstetric history, first trimester: Secondary | ICD-10-CM

## 2019-08-26 DIAGNOSIS — Z3402 Encounter for supervision of normal first pregnancy, second trimester: Secondary | ICD-10-CM | POA: Insufficient documentation

## 2019-08-26 DIAGNOSIS — Z113 Encounter for screening for infections with a predominantly sexual mode of transmission: Secondary | ICD-10-CM | POA: Diagnosis not present

## 2019-08-26 DIAGNOSIS — O99210 Obesity complicating pregnancy, unspecified trimester: Secondary | ICD-10-CM

## 2019-08-26 DIAGNOSIS — J4520 Mild intermittent asthma, uncomplicated: Secondary | ICD-10-CM | POA: Diagnosis not present

## 2019-08-26 DIAGNOSIS — Z3A10 10 weeks gestation of pregnancy: Secondary | ICD-10-CM

## 2019-08-26 MED ORDER — ASPIRIN 81 MG PO CHEW: 81.0000 mg | CHEWABLE_TABLET | Freq: Every day | ORAL | 3 refills | Status: DC

## 2019-08-26 NOTE — Addendum Note (Signed)
Addended by: Kathie Dike on: 08/26/2019 09:40 AM   Modules accepted: Orders

## 2019-08-26 NOTE — Addendum Note (Signed)
Addended by: Kathie Dike on: 08/26/2019 09:41 AM   Modules accepted: Orders

## 2019-08-26 NOTE — Progress Notes (Signed)
Subjective:   Carla Hall is a 24 y.o. G1P0000 at [redacted]w[redacted]d by LMP being seen today for her first obstetrical visit.  Her obstetrical history is significant for obesity. Patient does intend to breast feed. Pregnancy history fully reviewed.  Patient reports no complaints. Nausea has been improved with B6. Insomnia is improving with Unisom.   HISTORY: OB History  Gravida Para Term Preterm AB Living  1 0 0 0 0 0  SAB TAB Ectopic Multiple Live Births  0 0 0 0 0    # Outcome Date GA Lbr Len/2nd Weight Sex Delivery Anes PTL Lv  1 Current            Past Medical History:  Diagnosis Date  . H/O tympanostomy   . History of COVID-19   . Mild intermittent asthma 04/08/2018   Past Surgical History:  Procedure Laterality Date  . TYMPANOSTOMY TUBE PLACEMENT    . WISDOM TOOTH EXTRACTION     Family History  Problem Relation Age of Onset  . Diabetes Mellitus I Sister    Social History   Tobacco Use  . Smoking status: Never Smoker  . Smokeless tobacco: Never Used  Substance Use Topics  . Alcohol use: Not Currently    Comment: 1/WK  . Drug use: Never   Allergies  Allergen Reactions  . Penicillins    Current Outpatient Medications on File Prior to Visit  Medication Sig Dispense Refill  . albuterol (PROVENTIL HFA;VENTOLIN HFA) 108 (90 Base) MCG/ACT inhaler Inhale 2 puffs into the lungs every 6 (six) hours as needed for wheezing or shortness of breath. 1 Inhaler 0  . doxylamine, Sleep, (UNISOM) 25 MG tablet Take 25 mg by mouth at bedtime as needed.    . Prenatal Vit-Fe Fumarate-FA (PRENATAL VITAMIN PO) Take by mouth.     No current facility-administered medications on file prior to visit.     Indications for ASA therapy (per uptodate) One of the following: Previous pregnancy with preeclampsia, especially early onset and with an adverse outcome No Multifetal gestation No Chronic hypertension No Type 1 or 2 diabetes mellitus No Chronic kidney disease No Autoimmune disease  (antiphospholipid syndrome, systemic lupus erythematosus) No   Two or more of the following: Nulliparity Yes Obesity (body mass index >30 kg/m2) Yes Family history of preeclampsia in mother or sister No Age ?35 years No Sociodemographic characteristics (African American race, low socioeconomic level) No Personal risk factors (eg, previous pregnancy with low birth weight or small for gestational age infant, previous adverse pregnancy outcome [eg, stillbirth], interval >10 years between pregnancies) No   Indications for early 1 hour GTT (per uptodate)  BMI >25 (>23 in Asian women) AND one of the following  Gestational diabetes mellitus in a previous pregnancy No Glycated hemoglobin ?5.7 percent (39 mmol/mol), impaired glucose tolerance, or impaired fasting glucose on previous testing No First-degree relative with diabetes Yes High-risk race/ethnicity (eg, African American, Latino, Native American, [place] American, Pacific Islander) No History of cardiovascular disease No Hypertension or on therapy for hypertension No High-density lipoprotein cholesterol level <35 mg/dL (0.34 mmol/L) and/or a triglyceride level >250 mg/dL (7.42 mmol/L) No Polycystic ovary syndrome No Physical inactivity No Other clinical condition associated with insulin resistance (eg, severe obesity, acanthosis nigricans) No Previous birth of an infant weighing ?4000 g No Previous stillbirth of unknown cause No  Exam   Vitals:   08/26/19 0814  BP: 136/85  Pulse: (!) 101  Temp: 98.7 F (37.1 C)  Weight: 219 lb (  99.3 kg)   Fetal Heart Rate (bpm): 171       Pelvic Exam:  Deferred                      System: General: well-developed, well-nourished female in no acute distress   Breast:  normal appearance, no masses or tenderness   Skin: normal coloration and turgor, no rashes   Neurologic: oriented, normal, negative, normal mood   Extremities: normal strength, tone, and muscle mass, ROM of all joints is  normal   HEENT PERRLA, extraocular movement intact and sclera clear, anicteric   Mouth/Teeth mucous membranes moist, pharynx normal without lesions and dental hygiene good   Neck supple and no masses   Cardiovascular: regular rate and rhythm   Respiratory:  no respiratory distress, normal breath sounds   Abdomen: soft, non-tender; bowel sounds normal; no masses,  no organomegaly     Assessment:   Pregnancy: G1P0000 Patient Active Problem List   Diagnosis Date Noted  . Encounter for supervision of normal first pregnancy in first trimester 08/26/2019  . Mild intermittent asthma 04/08/2018    Plan:  1. Encounter for supervision of normal first pregnancy in first trimester - Obstetric panel - HIV antibody (with reflex) - Hepatitis C Antibody - Babyscripts Schedule Optimization - Hemoglobinopathy Evaluation - Urine cytology ancillary only([place])  2. Mild intermittent asthma without complication - has not used inhaler since college  Initial labs drawn. Early 2 hour GTT ordered Started on ASA after 12 weeks Continue prenatal vitamins. Genetic Screening discussed, AFP and NIPS: requested. Ultrasound discussed; fetal anatomic survey: requested. Problem list reviewed and updated The nature of [facility] with multiple MDs and other Advanced Practice Providers was explained to patient; also emphasized that fellows, residents, and students are part of our team. Routine obstetric precautions reviewed Return in about 4 weeks (around 09/23/2019).   Julianne Handler 9:22 AM 08/26/19

## 2019-08-26 NOTE — Progress Notes (Signed)
Bedsidre U/S shows sigle IUP with FHT of 171 BPM and CRL measures 35.42mm  GA [redacted]w[redacted]d

## 2019-08-27 LAB — HEMOGLOBIN A1C
Hgb A1c MFr Bld: 4.5 % of total Hgb (ref <5.7)
Mean Plasma Glucose: 82 (calc)
eAG (mmol/L): 4.6 (calc)

## 2019-08-28 LAB — CULTURE, OB URINE

## 2019-08-28 LAB — URINE CULTURE, OB REFLEX

## 2019-08-29 LAB — OBSTETRIC PANEL
Absolute Monocytes: 442 cells/uL (ref 200–950)
Antibody Screen: NOT DETECTED
Basophils Absolute: 43 cells/uL (ref 0–200)
Basophils Relative: 0.5 %
Eosinophils Absolute: 68 cells/uL (ref 15–500)
Eosinophils Relative: 0.8 %
HCT: 37.9 % (ref 35.0–45.0)
Hemoglobin: 12.3 g/dL (ref 11.7–15.5)
Hepatitis B Surface Ag: NONREACTIVE
Lymphs Abs: 1964 cells/uL (ref 850–3900)
MCH: 29.6 pg (ref 27.0–33.0)
MCHC: 32.5 g/dL (ref 32.0–36.0)
MCV: 91.1 fL (ref 80.0–100.0)
MPV: 10 fL (ref 7.5–12.5)
Monocytes Relative: 5.2 %
Neutro Abs: 5984 cells/uL (ref 1500–7800)
Neutrophils Relative %: 70.4 %
Platelets: 256 10*3/uL (ref 140–400)
RBC: 4.16 10*6/uL (ref 3.80–5.10)
RDW: 13.1 % (ref 11.0–15.0)
RPR Ser Ql: NONREACTIVE
Rubella: 3.9 Index
Total Lymphocyte: 23.1 %
WBC: 8.5 10*3/uL (ref 3.8–10.8)

## 2019-08-29 LAB — HEMOGLOBINOPATHY EVALUATION
Fetal Hemoglobin Testing: <1 % (ref 0.0–1.9)
HCT: 37.5 % (ref 35.0–45.0)
Hemoglobin A2 - HGBRFX: 2.8 % (ref 1.8–3.5)
Hemoglobin: 12.7 g/dL (ref 11.7–15.5)
Hgb A: 96.2 % (ref >96.0)
MCH: 30.7 pg (ref 27.0–33.0)
MCV: 90.6 fL (ref 80.0–100.0)
RBC: 4.14 10*6/uL (ref 3.80–5.10)
RDW: 13.3 % (ref 11.0–15.0)

## 2019-08-29 LAB — HEPATITIS C ANTIBODY
Hepatitis C Ab: NONREACTIVE
SIGNAL TO CUT-OFF: 0.01 (ref <1.00)

## 2019-08-29 LAB — URINE CYTOLOGY ANCILLARY ONLY
Chlamydia: NEGATIVE
Comment: NEGATIVE
Comment: NORMAL
Neisseria Gonorrhea: NEGATIVE

## 2019-08-29 LAB — HIV ANTIBODY (ROUTINE TESTING W REFLEX): HIV 1&2 Ab, 4th Generation: NONREACTIVE

## 2019-09-09 ENCOUNTER — Ambulatory Visit (INDEPENDENT_AMBULATORY_CARE_PROVIDER_SITE_OTHER): Payer: PRIVATE HEALTH INSURANCE

## 2019-09-09 ENCOUNTER — Other Ambulatory Visit: Payer: Self-pay

## 2019-09-09 DIAGNOSIS — Z3480 Encounter for supervision of other normal pregnancy, unspecified trimester: Secondary | ICD-10-CM | POA: Diagnosis not present

## 2019-09-09 NOTE — Progress Notes (Signed)
Blood drawn for Panorama 

## 2019-09-13 ENCOUNTER — Encounter: Payer: Self-pay | Admitting: Osteopathic Medicine

## 2019-09-13 NOTE — Telephone Encounter (Signed)
Can use any of the acute slots Weds/Thurs for this patient! Thanks!

## 2019-09-15 ENCOUNTER — Encounter: Payer: Self-pay | Admitting: *Deleted

## 2019-09-19 ENCOUNTER — Encounter: Payer: Self-pay | Admitting: *Deleted

## 2019-09-19 DIAGNOSIS — Z3401 Encounter for supervision of normal first pregnancy, first trimester: Secondary | ICD-10-CM

## 2019-09-22 NOTE — Progress Notes (Signed)
I connected with Carla Hall on 09/23/19 at  8:10 AM EDT by: MyChart and verified that I am speaking with the correct person using two identifiers.  Patient is located at home and provider is located at [facility].     The purpose of this virtual visit is to provide medical care while limiting exposure to the novel coronavirus. I discussed the limitations, risks, security and privacy concerns of performing an evaluation and management service by MyChart and the availability of in person appointments. I also discussed with the patient that there may be a patient responsible charge related to this service. By engaging in this virtual visit, you consent to the provision of healthcare.  Additionally, you authorize for your insurance to be billed for the services provided during this visit.  The patient expressed understanding and agreed to proceed.  The following staff members participated in the virtual visit:  Donia Ast    PRENATAL VISIT NOTE  Subjective:  Carla Hall is a 24 y.o. G1P0000 at [redacted]w[redacted]d  for phone visit for ongoing prenatal care.  She is currently monitored for the following issues for this low-risk pregnancy and has Mild intermittent asthma; Encounter for supervision of normal first pregnancy in first trimester; and Obesity in pregnancy on their problem list.  Patient reports no complaints.   . Vag. Bleeding: None.  Movement: Absent. Denies leaking of fluid.   The following portions of the patient's history were reviewed and updated as appropriate: allergies, current medications, past family history, past medical history, past social history, past surgical history and problem list.   Objective:   Vitals:   09/23/19 0805  BP: 129/77  Weight: 218 lb (98.9 kg)   Self-Obtained  Fetal Status:     Movement: Absent     Assessment and Plan:  Pregnancy: G1P0000 at [redacted]w[redacted]d  1. Encounter for supervision of normal first pregnancy in first trimester -will schedule anatomy  scan -peds list given -discussed prenatal classes, information given -AFP next visit in-person  2. Obesity in pregnancy -patient on baby ASA  3. Mild intermittent asthma without complication -no exacerbations  Preterm labor symptoms and general obstetric precautions including but not limited to vaginal bleeding, contractions, leaking of fluid and fetal movement were reviewed in detail with the patient. I discussed the assessment and treatment plan with the patient. The patient was provided an opportunity to ask questions and all were answered. The patient agreed with the plan and demonstrated an understanding of the instructions. The patient was advised to call back or seek an in-person office evaluation/go to [facility] at [facility] for any urgent or concerning symptoms.  Return in about 4 weeks (around 10/21/2019) for in-person AFP/ROB, needs anatomy scan scheduled.  No future appointments.   Time spent on virtual visit: 14 minutes  Marylen Ponto, NP

## 2019-09-23 ENCOUNTER — Encounter: Payer: Self-pay | Admitting: Women's Health

## 2019-09-23 ENCOUNTER — Telehealth (INDEPENDENT_AMBULATORY_CARE_PROVIDER_SITE_OTHER): Payer: PRIVATE HEALTH INSURANCE | Admitting: Women's Health

## 2019-09-23 VITALS — BP 129/77 | Wt 218.0 lb

## 2019-09-23 DIAGNOSIS — J4520 Mild intermittent asthma, uncomplicated: Secondary | ICD-10-CM

## 2019-09-23 DIAGNOSIS — Z3401 Encounter for supervision of normal first pregnancy, first trimester: Secondary | ICD-10-CM

## 2019-09-23 DIAGNOSIS — O99210 Obesity complicating pregnancy, unspecified trimester: Secondary | ICD-10-CM

## 2019-09-23 NOTE — Patient Instructions (Addendum)
Maternity Assessment Unit (MAU)  The Maternity Assessment Unit (MAU) is located at the [facility] and [place] at [facility]. The address is: [address] [place], [place], [place] [address]. Please see map below for additional directions.    The Maternity Assessment Unit is designed to help you during your pregnancy, and for up to 6 weeks after delivery, with any pregnancy- or postpartum-related emergencies, if you think you are in labor, or if your water has broken. For example, if you experience nausea and vomiting, vaginal bleeding, severe abdominal or pelvic pain, elevated blood pressure or other problems related to your pregnancy or postpartum time, please come to the Maternity Assessment Unit for assistance.    Preterm Labor and Birth Information  The normal length of a pregnancy is 39-41 weeks. Preterm labor is when labor starts before 37 completed weeks of pregnancy. What are the risk factors for preterm labor? Preterm labor is more likely to occur in women who:  Have certain infections during pregnancy such as a bladder infection, sexually transmitted infection, or infection inside the uterus (chorioamnionitis).  Have a shorter-than-normal cervix.  Have gone into preterm labor before.  Have had surgery on their cervix.  Are younger than age 46 or older than age 63.  Are African American.  Are pregnant with twins or multiple babies (multiple gestation).  Take street drugs or smoke while pregnant.  Do not gain enough weight while pregnant.  Became pregnant shortly after having been pregnant. What are the symptoms of preterm labor? Symptoms of preterm labor include:  Cramps similar to those that can happen during a menstrual period. The cramps may happen with diarrhea.  Pain in the abdomen or lower back.  Regular uterine contractions that may feel like tightening of the abdomen.  A feeling of increased pressure in the pelvis.  Increased  watery or bloody mucus discharge from the vagina.  Water breaking (ruptured amniotic sac). Why is it important to recognize signs of preterm labor? It is important to recognize signs of preterm labor because babies who are born prematurely may not be fully developed. This can put them at an increased risk for:  Long-term (chronic) heart and lung problems.  Difficulty immediately after birth with regulating body systems, including blood sugar, body temperature, heart rate, and breathing rate.  Bleeding in the brain.  Cerebral palsy.  Learning difficulties.  Death. These risks are highest for babies who are born before 51 weeks of pregnancy. How is preterm labor treated? Treatment depends on the length of your pregnancy, your condition, and the health of your baby. It may involve:  Having a stitch (suture) placed in your cervix to prevent your cervix from opening too early (cerclage).  Taking or being given medicines, such as: ? Hormone medicines. These may be given early in pregnancy to help support the pregnancy. ? Medicine to stop contractions. ? Medicines to help mature the baby's lungs. These may be prescribed if the risk of delivery is high. ? Medicines to prevent your baby from developing cerebral palsy. If the labor happens before 34 weeks of pregnancy, you may need to stay in the hospital. What should I do if I think I am in preterm labor? If you think that you are going into preterm labor, call your health care provider right away. How can I prevent preterm labor in future pregnancies? To increase your chance of having a full-term pregnancy:  Do not use any tobacco products, such as cigarettes, chewing tobacco, and  e-cigarettes. If you need help quitting, ask your health care provider.  Do not use street drugs or medicines that have not been prescribed to you during your pregnancy.  Talk with your health care provider before taking any herbal supplements, even if you  have been taking them regularly.  Make sure you gain a healthy amount of weight during your pregnancy.  Watch for infection. If you think that you might have an infection, get it checked right away.  Make sure to tell your health care provider if you have gone into preterm labor before. This information is not intended to replace advice given to you by your health care provider. Make sure you discuss any questions you have with your health care provider. Document Revised: 09/10/2018 Document Reviewed: 10/10/2015 Elsevier Patient Education  [place] PEDIATRIC/FAMILY PRACTICE PHYSICIANS  [facility] [address] [place], [place] [address] Phone - [phone]   Fax - [place] [address], [place]  [address] Phone - [phone]   Fax - [phone]  [place]. [address], [place] [address] [place], [place]  [address] Phone - [phone]   Fax - [phone]  [facility] [address] [place], [place]  [address] Phone - [phone]   Fax - [phone]  [place] [address] [place], [place]  [address] Phone - [phone]   Fax - [place] [address] [place], [place]  [address] Phone - [phone]   Fax - [place] [address] [place], [place]  [address] Phone - [phone]   Fax - [phone]  [place] [address] [place], [place], [place]  [address] Phone - [phone]   Fax - [place] [address] [place], [place]  [address] Phone - [phone]   Fax - [phone] [facility] [place]. [address] [place], [place]  [address] Phone - [phone]   Fax - [phone]  [place] [address] [place], [place]  [address] Phone - [phone]   Fax - [phone]  [facility] AT [place], [place], [place]  [address] Phone - [phone]   Fax - [place] [address], [place], [place]  [address] Phone - [phone]   Fax - [phone]  [facility] [address] [place], [place]  [address] Phone - [place], [place]  [address] Phone - [phone]   Fax - [place] [address], [place], [place]  [address] Phone - [phone]   Fax - [phone]  [place] [address] [place], [place]  [address] Phone - [phone]   Fax - [place]. [place], [place]  [address] Phone - [phone]   Fax - [place], [place], [place]  [address] Phone - [phone]   Fax - [place] [address], [place],   [address] Phone - [phone]   Fax - [phone]  [place] RUBIN [address] [place],   [address] Phone - [phone]  Fax - [phone]  [facility] [address] [place], [place]  [address] Phone - [phone]   Fax - [phone]  [place] - Alita Chyle [address] [place], [place]  [address] Phone - [phone]   Fax - [phone] Gerarda Fraction [address] [place], [place]  [address] Phone - [phone]   Fax - [phone]  [facility] [address] [place], [place]  [address] Phone - [phone]   Fax - [phone]  [facility] - [place] [address] [place], [place]  [address] Phone - [phone]   Fax - [phone]     Childbirth Education Options: [facility] Department Classes:  Childbirth education classes can help you get ready for a positive parenting experience. You can also meet other expectant parents and get free stuff for your baby. Each class runs for five weeks on the same night and costs $45 for the mother-to-be and her support person. Medicaid covers the cost if you are eligible. Call [phone] to register. [facility] Childbirth Education:  [phone] or ([phone] or [email or web address]  Baby & Me Class: Discuss newborn & infant parenting and family adjustment issues with other new mothers in a relaxed environment. Each week brings a new speaker or baby-centered activity. We encourage new mothers to join [place] every Thursday at 11:00am. Babies birth until crawling. No registration or fee. Daddy [employer]: This course offers Dads-to-be the tools and knowledge needed to feel confident on their journey to becoming new fathers. Experienced dads, who have been trained as coaches, teach dads-to-be how to hold, comfort, diaper, swaddle and play with their infant while being able to support the new mom as well. A class for men taught by men. $25/dad Big Brother/Big Sister: Let your children share in the joy of a new brother or sister in this special class designed just for them. Class includes discussion about how families care for babies: swaddling, holding, diapering, safety as well as how they can be helpful in their new role. This class is designed for children ages 2 to 50, but any age is welcome. Please register each child individually. $5/child  Mom Talk: This mom-led group offers support and connection to mothers as they journey through the adjustments and struggles of that sometimes overwhelming first year after the birth of a child. Tuesdays at 10:00am and Thursdays at 6:00pm. Babies welcome. No registration or fee. Breastfeeding Support Group: This group is a mother-to-mother support circle where moms have the opportunity  to share their breastfeeding experiences. A Lactation Consultant is present for questions and concerns. Meets each Tuesday at 11:00am. No fee or registration. Breastfeeding Your Baby: Learn what to expect in the first days of breastfeeding your newborn.  This class will help you feel more confident with the skills needed to begin your breastfeeding experience. Many new mothers are concerned about breastfeeding after leaving the hospital. This class will also address the most common fears and challenges about breastfeeding during the first few weeks, months and beyond. (call for fee) Comfort Techniques and Tour: This 2 hour interactive class will provide you the opportunity to learn & practice hands-on techniques that can help relieve some of the discomfort of labor and encourage your baby to rotate toward the best position for birth. You and your partner will be able to try a variety of labor positions with birth balls and rebozos as well as practice breathing, relaxation, and visualization techniques. A tour of  the [facility] is included with this class. $20 per registrant and support person Childbirth Class- Weekend Option: This class is a Weekend version of our Birth & Baby series. It is designed for parents who have a difficult time fitting several weeks of classes into their schedule. It covers the care of your newborn and the basics of labor and childbirth. It also includes a Maternity Care Center Tour of [facility] and lunch. The class is held two consecutive days: beginning on Friday evening from 6:30 - 8:30 p.m. and the next day, Saturday from 9 a.m. - 4 p.m. (call for fee) [place]en Dolin Class: Interested in a waterbirth?  This informational class will help you discover whether waterbirth is the right fit for you. Education about waterbirth itself, supplies you would need and how to assemble your support team is what you can expect from this class. Some obstetrical  practices require this class in order to pursue a waterbirth. (Not all obstetrical practices offer waterbirth-check with your healthcare provider.) Register only the expectant mom, but you are encouraged to bring your partner to class! Required if planning waterbirth, no fee. Infant/Child CPR: Parents, grandparents, babysitters, and friends learn Cardio-Pulmonary Resuscitation skills for infants and children. You will also learn how to treat both conscious and unconscious choking in infants and children. This Family & Friends program does not offer certification. Register each participant individually to ensure that enough mannequins are available. (Call for fee) Grandparent Love: Expecting a grandbaby? This class is for you! Learn about the latest infant care and safety recommendations and ways to support your own child as he or she transitions into the parenting role. Taught by Registered Nurses who are childbirth instructors, but most importantly...they are grandmothers too! $10/person. Childbirth Class- Natural Childbirth: This series of 5 weekly classes is for expectant parents who want to learn and practice natural methods of coping with the process of labor and childbirth. Relaxation, breathing, massage, visualization, role of the partner, and helpful positioning are highlighted. Participants learn how to be confident in their body's ability to give birth. This class will empower and help parents make informed decisions about their own care. Includes discussion that will help new parents transition into the immediate postpartum period. Maternity Care Center Tour of [facility] is included. We suggest taking this class between 25-32 weeks, but it's only a recommendation. $75 per registrant and one support person or $30 Medicaid. Childbirth Class- 3 week Series: This option of 3 weekly classes helps you and your labor partner prepare for childbirth. Newborn care, labor & birth, cesarean birth, pain  management, and comfort techniques are discussed and a Maternity Care Center Tour of [facility] is included. The class meets at the same time, on the same day of the week for 3 consecutive weeks beginning with the starting date you choose. $60 for registrant and one support person.  Marvelous Multiples: Expecting twins, triplets, or more? This class covers the differences in labor, birth, parenting, and breastfeeding issues that face multiples' parents. NICU tour is included. Led by a Certified Childbirth Educator who is the mother of twins. No fee. Caring for Baby: This class is for expectant and adoptive parents who want to learn and practice the most up-to-date newborn care for their babies. Focus is on birth through the first six weeks of life. Topics include feeding, bathing, diapering, crying, umbilical cord care, circumcision care and safe sleep. Parents learn to recognize symptoms of illness and when to call the pediatrician.  Register only the mom-to-be and your partner or support person can plan to come with you! $10 per registrant and support person Childbirth Class- online option: This online class offers you the freedom to complete a Birth and Baby series in the comfort of your own home. The flexibility of this option allows you to review sections at your own pace, at times convenient to you and your support people. It includes additional video information, animations, quizzes, and extended activities. Get organized with helpful eClass tools, checklists, and trackers. Once you register online for the class, you will receive an email within a few days to accept the invitation and begin the class when the time is right for you. The content will be available to you for 60 days. $60 for 60 days of online access for you and your support people.

## 2019-10-21 ENCOUNTER — Other Ambulatory Visit: Payer: Self-pay

## 2019-10-21 ENCOUNTER — Ambulatory Visit (INDEPENDENT_AMBULATORY_CARE_PROVIDER_SITE_OTHER): Payer: PRIVATE HEALTH INSURANCE | Admitting: Certified Nurse Midwife

## 2019-10-21 VITALS — BP 135/87 | HR 88 | Wt 228.0 lb

## 2019-10-21 DIAGNOSIS — Z3A18 18 weeks gestation of pregnancy: Secondary | ICD-10-CM

## 2019-10-21 DIAGNOSIS — Z3402 Encounter for supervision of normal first pregnancy, second trimester: Secondary | ICD-10-CM

## 2019-10-21 NOTE — Progress Notes (Signed)
Pt wants to wait for AFP until after her Anatomy scan next week

## 2019-10-21 NOTE — Progress Notes (Signed)
Subjective:  Carla Hall is a 24 y.o. G1P0000 at [redacted]w[redacted]d being seen today for ongoing prenatal care.  She is currently monitored for the following issues for this low-risk pregnancy and has Mild intermittent asthma; Encounter for supervision of normal first pregnancy in second trimester; and Obesity in pregnancy on their problem list.  Patient reports no complaints.   . Vag. Bleeding: None.  Movement: Absent. Denies leaking of fluid.   The following portions of the patient's history were reviewed and updated as appropriate: allergies, current medications, past family history, past medical history, past social history, past surgical history and problem list. Problem list updated.  Objective:   Vitals:   10/21/19 0921  BP: 135/87  Pulse: 88  Weight: 228 lb (103.4 kg)    Fetal Status: Fetal Heart Rate (bpm): 153 Fundal Height: 18 cm Movement: Absent     General:  Alert, oriented and cooperative. Patient is in no acute distress.  Skin: Skin is warm and dry. No rash noted.   Cardiovascular: Normal heart rate noted  Respiratory: Normal respiratory effort, no problems with respiration noted  Abdomen: Soft, gravid, appropriate for gestational age. Pain/Pressure: Absent     Pelvic: Vag. Bleeding: None Vag D/C Character: Thin   Cervical exam deferred        Extremities: Normal range of motion.  Edema: None  Mental Status: Normal mood and affect. Normal behavior. Normal judgment and thought content.   Urinalysis:      Assessment and Plan:  Pregnancy: G1P0000 at [redacted]w[redacted]d  1. Encounter for supervision of normal first pregnancy in second trimester - anatomy US scheduled next week   Preterm labor symptoms and general obstetric precautions including but not limited to vaginal bleeding, contractions, leaking of fluid and fetal movement were reviewed in detail with the patient. Please refer to After Visit Summary for other counseling recommendations.  Return in about 2 weeks (around 11/04/2019).-  virtual   [name], CNM

## 2019-10-27 ENCOUNTER — Other Ambulatory Visit: Payer: Self-pay

## 2019-10-27 ENCOUNTER — Other Ambulatory Visit: Payer: Self-pay | Admitting: Women's Health

## 2019-10-27 ENCOUNTER — Other Ambulatory Visit: Payer: Self-pay | Admitting: *Deleted

## 2019-10-27 ENCOUNTER — Ambulatory Visit: Payer: PRIVATE HEALTH INSURANCE | Attending: Women's Health

## 2019-10-27 DIAGNOSIS — Z3A19 19 weeks gestation of pregnancy: Secondary | ICD-10-CM | POA: Diagnosis not present

## 2019-10-27 DIAGNOSIS — O99210 Obesity complicating pregnancy, unspecified trimester: Secondary | ICD-10-CM

## 2019-10-27 DIAGNOSIS — Z363 Encounter for antenatal screening for malformations: Secondary | ICD-10-CM

## 2019-10-27 DIAGNOSIS — O99212 Obesity complicating pregnancy, second trimester: Secondary | ICD-10-CM | POA: Diagnosis not present

## 2019-10-27 DIAGNOSIS — Z3401 Encounter for supervision of normal first pregnancy, first trimester: Secondary | ICD-10-CM

## 2019-10-27 DIAGNOSIS — E669 Obesity, unspecified: Secondary | ICD-10-CM | POA: Diagnosis not present

## 2019-11-09 ENCOUNTER — Telehealth (INDEPENDENT_AMBULATORY_CARE_PROVIDER_SITE_OTHER): Payer: PRIVATE HEALTH INSURANCE | Admitting: Obstetrics and Gynecology

## 2019-11-09 DIAGNOSIS — O99212 Obesity complicating pregnancy, second trimester: Secondary | ICD-10-CM

## 2019-11-09 DIAGNOSIS — Z3402 Encounter for supervision of normal first pregnancy, second trimester: Secondary | ICD-10-CM

## 2019-11-09 DIAGNOSIS — Z3A21 21 weeks gestation of pregnancy: Secondary | ICD-10-CM

## 2019-11-09 DIAGNOSIS — O99210 Obesity complicating pregnancy, unspecified trimester: Secondary | ICD-10-CM

## 2019-11-09 DIAGNOSIS — E669 Obesity, unspecified: Secondary | ICD-10-CM

## 2019-11-09 NOTE — Progress Notes (Signed)
   TELEHEALTH OBSTETRICS VISIT ENCOUNTER NOTE  I connected with Carla Hall on 11/09/19 at  8:30 AM EDT by telephone at home and verified that I am speaking with the correct person using two identifiers.   I discussed the limitations, risks, security and privacy concerns of performing an evaluation and management service by telephone and the availability of in person appointments. I also discussed with the patient that there may be a patient responsible charge related to this service. The patient expressed understanding and agreed to proceed.  Subjective:  Carla Hall is a 24 y.o. G1P0000 at [redacted]w[redacted]d being followed for ongoing prenatal care.  She is currently monitored for the following issues for this low-risk pregnancy and has Mild intermittent asthma; Encounter for supervision of normal first pregnancy in second trimester; and Obesity in pregnancy on their problem list.  Patient reports no complaints. Reports fetal movement. Denies any contractions, bleeding or leaking of fluid.   The following portions of the patient's history were reviewed and updated as appropriate: allergies, current medications, past family history, past medical history, past social history, past surgical history and problem list.   Objective:   General:  Alert, oriented and cooperative.   Mental Status: Normal Hall and affect perceived. Normal judgment and thought content.  Rest of physical exam deferred due to type of encounter  Assessment and Plan:  Pregnancy: G1P0000 at [redacted]w[redacted]d 1. Encounter for supervision of normal first pregnancy in second trimester  BP 131/75 Continue BASA   2. Obesity in pregnancy  Continue BASA   Preterm labor symptoms and general obstetric precautions including but not limited to vaginal bleeding, contractions, leaking of fluid and fetal movement were reviewed in detail with the patient.  I discussed the assessment and treatment plan with the patient. The patient was provided an  opportunity to ask questions and all were answered. The patient agreed with the plan and demonstrated an understanding of the instructions. The patient was advised to call back or seek an in-person office evaluation/go to [facility] at [facility] for any urgent or concerning symptoms. Please refer to After Visit Summary for other counseling recommendations.   I provided 9 minutes of non-face-to-face time during this encounter.  Return in about 4 weeks (around 12/07/2019) for Virtual or in-person visit .  Future Appointments  Date Time Provider Department Center  11/24/2019  8:00 AM [facility] US1 [facility]    Venia Carbon, NP Center for [employer], [facility]

## 2019-11-24 ENCOUNTER — Ambulatory Visit: Payer: PRIVATE HEALTH INSURANCE | Attending: Obstetrics and Gynecology

## 2019-11-24 ENCOUNTER — Other Ambulatory Visit: Payer: Self-pay

## 2019-11-24 DIAGNOSIS — O99212 Obesity complicating pregnancy, second trimester: Secondary | ICD-10-CM

## 2019-11-24 DIAGNOSIS — O99210 Obesity complicating pregnancy, unspecified trimester: Secondary | ICD-10-CM

## 2019-11-24 DIAGNOSIS — E669 Obesity, unspecified: Secondary | ICD-10-CM

## 2019-11-24 DIAGNOSIS — Z362 Encounter for other antenatal screening follow-up: Secondary | ICD-10-CM | POA: Diagnosis not present

## 2019-11-24 DIAGNOSIS — Z3A23 23 weeks gestation of pregnancy: Secondary | ICD-10-CM

## 2019-12-06 ENCOUNTER — Other Ambulatory Visit: Payer: Self-pay

## 2019-12-06 ENCOUNTER — Ambulatory Visit (INDEPENDENT_AMBULATORY_CARE_PROVIDER_SITE_OTHER): Payer: PRIVATE HEALTH INSURANCE | Admitting: Advanced Practice Midwife

## 2019-12-06 VITALS — BP 139/84 | HR 91 | Wt 241.0 lb

## 2019-12-06 DIAGNOSIS — Z3402 Encounter for supervision of normal first pregnancy, second trimester: Secondary | ICD-10-CM

## 2019-12-06 DIAGNOSIS — E669 Obesity, unspecified: Secondary | ICD-10-CM

## 2019-12-06 DIAGNOSIS — O99212 Obesity complicating pregnancy, second trimester: Secondary | ICD-10-CM

## 2019-12-06 DIAGNOSIS — Z3A24 24 weeks gestation of pregnancy: Secondary | ICD-10-CM

## 2019-12-06 NOTE — Progress Notes (Signed)
° °  PRENATAL VISIT NOTE  Subjective:  Carla Hall is a 24 y.o. G1P0000 at [redacted]w[redacted]d being seen today for ongoing prenatal care.  She is currently monitored for the following issues for this low-risk pregnancy and has Mild intermittent asthma; Encounter for supervision of normal first pregnancy in second trimester; and Obesity in pregnancy on their problem list.  Patient reports no complaints.  Contractions: Not present. Vag. Bleeding: None.  Movement: Present. Denies leaking of fluid.   The following portions of the patient's history were reviewed and updated as appropriate: allergies, current medications, past family history, past medical history, past social history, past surgical history and problem list.   Objective:   Vitals:   12/06/19 0852  BP: 139/84  Pulse: 91  Weight: 241 lb (109.3 kg)    Fetal Status: Fetal Heart Rate (bpm): 154 Fundal Height: 26 cm Movement: Present     General:  Alert, oriented and cooperative. Patient is in no acute distress.  Skin: Skin is warm and dry. No rash noted.   Cardiovascular: Normal heart rate noted  Respiratory: Normal respiratory effort, no problems with respiration noted  Abdomen: Soft, gravid, appropriate for gestational age.  Pain/Pressure: Absent     Pelvic: Cervical exam deferred        Extremities: Normal range of motion.  Edema: Trace  Mental Status: Normal mood and affect. Normal behavior. Normal judgment and thought content.   Assessment and Plan:  Pregnancy: G1P0000 at [redacted]w[redacted]d 1. Encounter for supervision of normal first pregnancy in second trimester --Anticipatory guidance about next visits/weeks of pregnancy given. --Reviewed normal anatomy US follow up with pt --Next visit in 4 weeks in office for GTT  2.  Obesity in pregnancy --Pt BMI is 30, no other risks --FH slightly higher today at 26, will do growth [place] in 4 weeks, consider repeating growth related to obesity if needed  Preterm labor symptoms and general obstetric  precautions including but not limited to vaginal bleeding, contractions, leaking of fluid and fetal movement were reviewed in detail with the patient. Please refer to After Visit Summary for other counseling recommendations.   No follow-ups on file.  No future appointments.  Sharen Counter, CNM

## 2019-12-06 NOTE — Patient Instructions (Addendum)
Considering Waterbirth? °Guide for patients at [facility] °Why consider waterbirth? °• Gentle birth for babies  °• Less pain medicine used in labor  °• May allow for passive descent/less pushing  °• May reduce perineal tears  °• More mobility and instinctive maternal position changes  °• Increased maternal relaxation  °• Reduced blood pressure in labor  ° °Is waterbirth safe? What are the risks of infection, drowning or other complications? °• Infection:  °• Very low risk (3.7 % for tub vs 4.8% for bed)  °• 7 in 8000 waterbirths with documented infection  °• Poorly cleaned equipment most common cause  °• Slightly lower group B strep transmission rate  °• Drowning  °• Maternal:  °• Very low risk  °• Related to seizures or fainting  °• Newborn:  °• Very low risk. No evidence of increased risk of respiratory problems in multiple large studies  °• Physiological protection from breathing under water  °• Avoid underwater birth if there are any fetal complications  °• Once baby's head is out of the water, keep it out.  °• Birth complication  °• Some reports of cord trauma, but risk decreased by bringing baby to surface gradually  °• No evidence of increased risk of shoulder dystocia. Mothers can usually change positions faster in water than in a bed, possibly aiding the maneuvers to free the shoulder.  °? °You must attend a Waterbirth class at [facility] [place] °• 3rd Wednesday of every month from 7-9pm  °• Free  °• Register by calling [phone] or online at [email or web address]  °• Bring us the certificate from the class to your prenatal appointment  °Meet with a midwife at 36 weeks to see if you can still plan a waterbirth and to sign the consent.  ° °If you plan a waterbirth at [facility] [place], you can opt to purchase the following: °• Fish Net °• Bathing suit top (optional)  °• Long-handled mirror (optional)  °•  °Things that would  prevent you from having a waterbirth: °• Unknown or Positive COVID-19 diagnosis upon admission to hospital  °• Premature, <37wks  °• Previous cesarean birth  °• Presence of thick meconium-stained fluid  °• Multiple gestation (Twins, triplets, etc.)  °• Uncontrolled diabetes or gestational diabetes requiring medication  °• Hypertension requiring medication or diagnosis of pre-eclampsia  °• Heavy vaginal bleeding  °• Non-reassuring fetal heart rate  °• Active infection (MRSA, etc.). Group B Strep is NOT a contraindication for waterbirth.  °• If your labor has to be induced and induction method requires continuous monitoring of the baby's heart rate  °• Other risks/issues identified by your obstetrical provider  °Please remember that birth is unpredictable. Under certain unforeseeable circumstances your provider may advise against giving birth in the tub. These decisions will be made on a case-by-case basis and with the safety of you and your baby as our highest priority. ° °**Please remember that in order to have a waterbirth, you must test Negative to COVID-19 upon admission to the hospital.** ° °

## 2019-12-28 ENCOUNTER — Ambulatory Visit: Payer: PRIVATE HEALTH INSURANCE | Admitting: *Deleted

## 2019-12-28 ENCOUNTER — Other Ambulatory Visit: Payer: Self-pay

## 2019-12-28 ENCOUNTER — Ambulatory Visit: Payer: PRIVATE HEALTH INSURANCE | Attending: Obstetrics and Gynecology

## 2019-12-28 DIAGNOSIS — E669 Obesity, unspecified: Secondary | ICD-10-CM

## 2019-12-28 DIAGNOSIS — Z3402 Encounter for supervision of normal first pregnancy, second trimester: Secondary | ICD-10-CM | POA: Diagnosis present

## 2019-12-28 DIAGNOSIS — Z3A28 28 weeks gestation of pregnancy: Secondary | ICD-10-CM

## 2019-12-28 DIAGNOSIS — O99213 Obesity complicating pregnancy, third trimester: Secondary | ICD-10-CM | POA: Diagnosis not present

## 2019-12-28 DIAGNOSIS — O99210 Obesity complicating pregnancy, unspecified trimester: Secondary | ICD-10-CM | POA: Diagnosis present

## 2019-12-28 DIAGNOSIS — O99212 Obesity complicating pregnancy, second trimester: Secondary | ICD-10-CM | POA: Diagnosis present

## 2020-01-02 ENCOUNTER — Other Ambulatory Visit: Payer: Self-pay

## 2020-01-02 ENCOUNTER — Ambulatory Visit (INDEPENDENT_AMBULATORY_CARE_PROVIDER_SITE_OTHER): Payer: PRIVATE HEALTH INSURANCE | Admitting: Obstetrics & Gynecology

## 2020-01-02 VITALS — BP 137/79 | HR 91 | Wt 248.0 lb

## 2020-01-02 DIAGNOSIS — Z3402 Encounter for supervision of normal first pregnancy, second trimester: Secondary | ICD-10-CM | POA: Diagnosis not present

## 2020-01-02 DIAGNOSIS — Z23 Encounter for immunization: Secondary | ICD-10-CM | POA: Diagnosis not present

## 2020-01-02 DIAGNOSIS — O163 Unspecified maternal hypertension, third trimester: Secondary | ICD-10-CM

## 2020-01-02 NOTE — Progress Notes (Signed)
   PRENATAL VISIT NOTE  Subjective:  Carla Hall is a 24 y.o. G1P0000 at [redacted]w[redacted]d being seen today for ongoing prenatal care.  She is currently monitored for the following issues for this low-risk pregnancy and has Mild intermittent asthma; Encounter for supervision of normal first pregnancy in second trimester; and Obesity in pregnancy on their problem list.  Patient reports hand swelling (rings don't fit).  Contractions: Not present. Vag. Bleeding: None.  Movement: Present. Denies leaking of fluid.   The following portions of the patient's history were reviewed and updated as appropriate: allergies, current medications, past family history, past medical history, past social history, past surgical history and problem list.   Objective:   Vitals:   01/02/20 0817  BP: (!) 137/79  Pulse: 91  Weight: (!) 248 lb (112.5 kg)    Fetal Status: Fetal Heart Rate (bpm): 152   Movement: Present     General:  Alert, oriented and cooperative. Patient is in no acute distress.  Skin: Skin is warm and dry. No rash noted.   Cardiovascular: Normal heart rate noted  Respiratory: Normal respiratory effort, no problems with respiration noted  Abdomen: Soft, gravid, appropriate for gestational age.  Pain/Pressure: Absent     Pelvic: Cervical exam deferred        Extremities: Normal range of motion.  Edema: Trace  Mental Status: Normal mood and affect. Normal behavior. Normal judgment and thought content.   Assessment and Plan:  Pregnancy: G1P0000 at [redacted]w[redacted]d 1. Encounter for supervision of normal first pregnancy in second trimester - 2Hr GTT w/ 1 Hr Carpenter 75 g - HIV antibody (with reflex) - CBC - RPR  2. Hypertension affecting pregnancy in third trimester - CMP - Protein / creatinine ratio, urine - Daily BP with Baby Rx - Reviewed signs and symptoms of Pre E  Preterm labor symptoms and general obstetric precautions including but not limited to vaginal bleeding, contractions, leaking of fluid  and fetal movement were reviewed in detail with the patient. Please refer to After Visit Summary for other counseling recommendations.    Elsie Lincoln, MD

## 2020-01-02 NOTE — Addendum Note (Signed)
Addended by: Kathie Dike on: 01/02/2020 10:06 AM   Modules accepted: Orders

## 2020-01-03 LAB — COMPREHENSIVE METABOLIC PANEL
AG Ratio: 1.3 (calc) (ref 1.0–2.5)
ALT: 8 U/L (ref 6–29)
AST: 12 U/L (ref 10–30)
Albumin: 3.5 g/dL — ABNORMAL LOW (ref 3.6–5.1)
Alkaline phosphatase (APISO): 66 U/L (ref 31–125)
BUN: 7 mg/dL (ref 7–25)
CO2: 24 mmol/L (ref 20–32)
Calcium: 9.6 mg/dL (ref 8.6–10.2)
Chloride: 105 mmol/L (ref 98–110)
Creat: 0.58 mg/dL (ref 0.50–1.10)
Globulin: 2.7 g/dL (calc) (ref 1.9–3.7)
Glucose, Bld: 75 mg/dL (ref 65–99)
Potassium: 3.8 mmol/L (ref 3.5–5.3)
Sodium: 138 mmol/L (ref 135–146)
Total Bilirubin: 0.3 mg/dL (ref 0.2–1.2)
Total Protein: 6.2 g/dL (ref 6.1–8.1)

## 2020-01-03 LAB — CBC
HCT: 33.4 % — ABNORMAL LOW (ref 35.0–45.0)
Hemoglobin: 11.3 g/dL — ABNORMAL LOW (ref 11.7–15.5)
MCH: 30.4 pg (ref 27.0–33.0)
MCHC: 33.8 g/dL (ref 32.0–36.0)
MCV: 89.8 fL (ref 80.0–100.0)
MPV: 9.9 fL (ref 7.5–12.5)
Platelets: 265 10*3/uL (ref 140–400)
RBC: 3.72 10*6/uL — ABNORMAL LOW (ref 3.80–5.10)
RDW: 12.9 % (ref 11.0–15.0)
WBC: 11.2 10*3/uL — ABNORMAL HIGH (ref 3.8–10.8)

## 2020-01-03 LAB — PROTEIN / CREATININE RATIO, URINE
Creatinine, Urine: 149 mg/dL (ref 20–275)
Protein/Creat Ratio: 195 mg/g creat — ABNORMAL HIGH (ref 21–161)
Protein/Creatinine Ratio: 0.195 mg/mg creat — ABNORMAL HIGH (ref 0.021–0.16)
Total Protein, Urine: 29 mg/dL — ABNORMAL HIGH (ref 5–24)

## 2020-01-03 LAB — HIV ANTIBODY (ROUTINE TESTING W REFLEX): HIV 1&2 Ab, 4th Generation: NONREACTIVE

## 2020-01-03 LAB — 2HR GTT W 1 HR, CARPENTER, 75 G
Glucose, 1 Hr, Gest: 122 mg/dL (ref 65–179)
Glucose, 2 Hr, Gest: 85 mg/dL (ref 65–152)
Glucose, Fasting, Gest: 74 mg/dL (ref 65–91)

## 2020-01-03 LAB — RPR: RPR Ser Ql: NONREACTIVE

## 2020-01-04 ENCOUNTER — Other Ambulatory Visit: Payer: Self-pay | Admitting: *Deleted

## 2020-01-04 MED ORDER — FAMOTIDINE 20 MG PO TABS
20.0000 mg | ORAL_TABLET | Freq: Two times a day (BID) | ORAL | 6 refills | Status: DC
Start: 2020-01-04 — End: 2020-03-13

## 2020-01-05 ENCOUNTER — Telehealth: Payer: Self-pay | Admitting: Obstetrics & Gynecology

## 2020-01-05 NOTE — Telephone Encounter (Signed)
Left message that I called to review labs.  The protein creatinine ratio is normal for pregnancy.  (>300 is considered elevated).

## 2020-01-06 ENCOUNTER — Telehealth: Payer: Self-pay | Admitting: Obstetrics & Gynecology

## 2020-01-06 NOTE — Telephone Encounter (Signed)
Discussed lab results with patient.  No signs of pre-E.  BPs remain in 130s/80s.  Pt to take BP daily and keep appt (switched to in person).

## 2020-01-16 ENCOUNTER — Encounter: Payer: PRIVATE HEALTH INSURANCE | Admitting: Obstetrics and Gynecology

## 2020-01-17 ENCOUNTER — Ambulatory Visit (INDEPENDENT_AMBULATORY_CARE_PROVIDER_SITE_OTHER): Payer: PRIVATE HEALTH INSURANCE | Admitting: Advanced Practice Midwife

## 2020-01-17 ENCOUNTER — Other Ambulatory Visit: Payer: Self-pay

## 2020-01-17 VITALS — BP 141/84 | HR 99 | Wt 251.0 lb

## 2020-01-17 DIAGNOSIS — O0990 Supervision of high risk pregnancy, unspecified, unspecified trimester: Secondary | ICD-10-CM

## 2020-01-17 DIAGNOSIS — O133 Gestational [pregnancy-induced] hypertension without significant proteinuria, third trimester: Secondary | ICD-10-CM | POA: Insufficient documentation

## 2020-01-17 DIAGNOSIS — Z3A30 30 weeks gestation of pregnancy: Secondary | ICD-10-CM

## 2020-01-17 NOTE — Progress Notes (Signed)
Protein Trace  BP recheck 138/80 P-83

## 2020-01-17 NOTE — Progress Notes (Signed)
   PRENATAL VISIT NOTE  Subjective:  Carla Hall is a 24 y.o. G1P0000 at [redacted]w[redacted]d being seen today for ongoing prenatal care.  She is currently monitored for the following issues for this high-risk pregnancy and has Mild intermittent asthma; Encounter for supervision of normal first pregnancy in second trimester; Obesity in pregnancy; and Gestational hypertension, third trimester on their problem list.  Patient reports no complaints.  Contractions: Not present. Vag. Bleeding: None.  Movement: Present. Denies leaking of fluid.   The following portions of the patient's history were reviewed and updated as appropriate: allergies, current medications, past family history, past medical history, past social history, past surgical history and problem list.   Objective:   Vitals:   01/17/20 0855  BP: (!) 141/84  Pulse: 99  Weight: 251 lb (113.9 kg)    Fetal Status: Fetal Heart Rate (bpm): 147   Movement: Present  Presentation: Complete Breech  General:  Alert, oriented and cooperative. Patient is in no acute distress.  Skin: Skin is warm and dry. No rash noted.   Cardiovascular: Normal heart rate noted  Respiratory: Normal respiratory effort, no problems with respiration noted  Abdomen: Soft, gravid, appropriate for gestational age.  Pain/Pressure: Absent     Pelvic: Cervical exam deferred        Extremities: Normal range of motion.  Edema: Trace  Mental Status: Normal mood and affect. Normal behavior. Normal judgment and thought content.   Assessment and Plan:  Pregnancy: G1P0000 at [redacted]w[redacted]d 1. [redacted] weeks gestation of pregnancy  - [place] MFM OB FOLLOW UP; Future - [place] MFM FETAL BPP WO NON STRESS; Future  2. Gestational hypertension, third trimester --Isolated HTN prior to today, but home cuff readings and office readings have been elevated 140s/90s now on multiple occasions.  --Discussed diagnosis with patient and changes to care/delivery plan --Pt states understanding and agrees with plan of  care --Weekly testing to start now, with delivery between 37-37.6 weeks per MFM --Will continue to monitor for PEC and/or severe features --PEC precautions/reasons to seek care reviewed  - [place] MFM OB FOLLOW UP; Future - [place] MFM FETAL BPP WO NON STRESS; Future  3. Supervision of high risk pregnancy, antepartum --Anticipatory guidance about next visits/weeks of pregnancy given. --NST and AFI in office today, then BPP with growth next week at MFM --Pt prefers weekly testing in [place] so will do NST/AFI in office when not doing growth [place] --Appt with provider in 2 weeks with next NST/AFI appointment  Preterm labor symptoms and general obstetric precautions including but not limited to vaginal bleeding, contractions, leaking of fluid and fetal movement were reviewed in detail with the patient. Please refer to After Visit Summary for other counseling recommendations.   No follow-ups on file.  Future Appointments  Date Time Provider Department Center  01/24/2020  8:10 AM [facility] NURSE [facility]  01/24/2020  8:30 AM [facility] US3 [facility]  01/31/2020  8:35 AM Leftwich-Kirby, Wilmer Floor, CNM [facility]    Sharen Counter, CNM

## 2020-01-24 ENCOUNTER — Encounter: Payer: Self-pay | Admitting: *Deleted

## 2020-01-24 ENCOUNTER — Ambulatory Visit: Payer: PRIVATE HEALTH INSURANCE | Admitting: *Deleted

## 2020-01-24 ENCOUNTER — Other Ambulatory Visit: Payer: Self-pay

## 2020-01-24 ENCOUNTER — Ambulatory Visit: Payer: PRIVATE HEALTH INSURANCE | Attending: Advanced Practice Midwife

## 2020-01-24 DIAGNOSIS — O133 Gestational [pregnancy-induced] hypertension without significant proteinuria, third trimester: Secondary | ICD-10-CM | POA: Insufficient documentation

## 2020-01-24 DIAGNOSIS — Z3A31 31 weeks gestation of pregnancy: Secondary | ICD-10-CM | POA: Insufficient documentation

## 2020-01-24 DIAGNOSIS — O99210 Obesity complicating pregnancy, unspecified trimester: Secondary | ICD-10-CM

## 2020-01-24 DIAGNOSIS — Z6831 Body mass index (BMI) 31.0-31.9, adult: Secondary | ICD-10-CM

## 2020-01-24 DIAGNOSIS — Z3A30 30 weeks gestation of pregnancy: Secondary | ICD-10-CM

## 2020-01-24 DIAGNOSIS — Z3402 Encounter for supervision of normal first pregnancy, second trimester: Secondary | ICD-10-CM

## 2020-01-24 DIAGNOSIS — E663 Overweight: Secondary | ICD-10-CM

## 2020-01-24 DIAGNOSIS — E669 Obesity, unspecified: Secondary | ICD-10-CM | POA: Diagnosis not present

## 2020-01-24 DIAGNOSIS — Z362 Encounter for other antenatal screening follow-up: Secondary | ICD-10-CM

## 2020-01-24 DIAGNOSIS — O99213 Obesity complicating pregnancy, third trimester: Secondary | ICD-10-CM | POA: Diagnosis not present

## 2020-01-26 ENCOUNTER — Other Ambulatory Visit: Payer: Self-pay | Admitting: *Deleted

## 2020-01-26 DIAGNOSIS — Z3689 Encounter for other specified antenatal screening: Secondary | ICD-10-CM

## 2020-01-31 ENCOUNTER — Other Ambulatory Visit: Payer: Self-pay

## 2020-01-31 ENCOUNTER — Ambulatory Visit (INDEPENDENT_AMBULATORY_CARE_PROVIDER_SITE_OTHER): Payer: PRIVATE HEALTH INSURANCE | Admitting: Advanced Practice Midwife

## 2020-01-31 VITALS — BP 137/87 | HR 83 | Wt 253.0 lb

## 2020-01-31 DIAGNOSIS — O133 Gestational [pregnancy-induced] hypertension without significant proteinuria, third trimester: Secondary | ICD-10-CM | POA: Diagnosis not present

## 2020-01-31 DIAGNOSIS — O0993 Supervision of high risk pregnancy, unspecified, third trimester: Secondary | ICD-10-CM | POA: Diagnosis not present

## 2020-01-31 DIAGNOSIS — O0990 Supervision of high risk pregnancy, unspecified, unspecified trimester: Secondary | ICD-10-CM

## 2020-01-31 DIAGNOSIS — Z3A32 32 weeks gestation of pregnancy: Secondary | ICD-10-CM

## 2020-01-31 NOTE — Progress Notes (Signed)
   PRENATAL VISIT NOTE  Subjective:  Carla Hall is a 24 y.o. G1P0000 at [redacted]w[redacted]d being seen today for ongoing prenatal care.  She is currently monitored for the following issues for this high-risk pregnancy and has Mild intermittent asthma; Encounter for supervision of normal first pregnancy in second trimester; Obesity in pregnancy; and Gestational hypertension, third trimester on their problem list.  Patient reports no complaints.  Contractions: Not present. Vag. Bleeding: None.  Movement: Present. Denies leaking of fluid.   The following portions of the patient's history were reviewed and updated as appropriate: allergies, current medications, past family history, past medical history, past social history, past surgical history and problem list.   Objective:   Vitals:   01/31/20 0830  Weight: 253 lb (114.8 kg)    Fetal Status:     Movement: Present     General:  Alert, oriented and cooperative. Patient is in no acute distress.  Skin: Skin is warm and dry. No rash noted.   Cardiovascular: Normal heart rate noted  Respiratory: Normal respiratory effort, no problems with respiration noted  Abdomen: Soft, gravid, appropriate for gestational age.  Pain/Pressure: Absent     Pelvic: Cervical exam deferred        Extremities: Normal range of motion.  Edema: Trace  Mental Status: Normal mood and affect. Normal behavior. Normal judgment and thought content.   Assessment and Plan:  Pregnancy: G1P0000 at [redacted]w[redacted]d 1. Supervision of high risk pregnancy, antepartum --Anticipatory guidance about next visits/weeks of pregnancy given. --Next visit later this week for NST, twice weekly NST with weekly AFI in office --BPP with growth [place] every 4 weeks with MFM --Reactive NST today  2. Gestational hypertension, third trimester --BP stable 140s/90s, pt has home cuff and is taking BP 3-4 times/week  3. [redacted] weeks gestation of pregnancy   Preterm labor symptoms and general obstetric precautions  including but not limited to vaginal bleeding, contractions, leaking of fluid and fetal movement were reviewed in detail with the patient. Please refer to After Visit Summary for other counseling recommendations.   No follow-ups on file.  Future Appointments  Date Time Provider Department Center  02/21/2020  8:00 AM [facility] NURSE [facility]  02/21/2020  8:15 AM [facility] US2 [facility]    Sharen Counter, CNM

## 2020-02-03 ENCOUNTER — Ambulatory Visit (INDEPENDENT_AMBULATORY_CARE_PROVIDER_SITE_OTHER): Payer: PRIVATE HEALTH INSURANCE | Admitting: *Deleted

## 2020-02-03 ENCOUNTER — Other Ambulatory Visit: Payer: Self-pay

## 2020-02-03 VITALS — BP 136/86 | HR 82

## 2020-02-03 DIAGNOSIS — O163 Unspecified maternal hypertension, third trimester: Secondary | ICD-10-CM

## 2020-02-03 DIAGNOSIS — O133 Gestational [pregnancy-induced] hypertension without significant proteinuria, third trimester: Secondary | ICD-10-CM | POA: Diagnosis not present

## 2020-02-03 DIAGNOSIS — Z3A33 33 weeks gestation of pregnancy: Secondary | ICD-10-CM

## 2020-02-03 NOTE — Progress Notes (Signed)
NST reactive.

## 2020-02-06 NOTE — Progress Notes (Signed)
NST:  Baseline: 140 bpm, Variability: Good {> 6 bpm), Accelerations: Reactive and Decelerations: Absent   

## 2020-02-07 ENCOUNTER — Ambulatory Visit (INDEPENDENT_AMBULATORY_CARE_PROVIDER_SITE_OTHER): Payer: PRIVATE HEALTH INSURANCE | Admitting: *Deleted

## 2020-02-07 ENCOUNTER — Other Ambulatory Visit: Payer: Self-pay

## 2020-02-07 VITALS — BP 135/85 | HR 82

## 2020-02-07 DIAGNOSIS — Z3402 Encounter for supervision of normal first pregnancy, second trimester: Secondary | ICD-10-CM

## 2020-02-07 DIAGNOSIS — Z23 Encounter for immunization: Secondary | ICD-10-CM

## 2020-02-07 DIAGNOSIS — Z3A33 33 weeks gestation of pregnancy: Secondary | ICD-10-CM | POA: Diagnosis not present

## 2020-02-07 NOTE — Addendum Note (Signed)
Addended by: Kathie Dike on: 02/07/2020 10:02 AM   Modules accepted: Orders

## 2020-02-07 NOTE — Progress Notes (Signed)
NST reactive and AFI WNL

## 2020-02-10 ENCOUNTER — Other Ambulatory Visit: Payer: Self-pay

## 2020-02-10 ENCOUNTER — Ambulatory Visit (INDEPENDENT_AMBULATORY_CARE_PROVIDER_SITE_OTHER): Payer: PRIVATE HEALTH INSURANCE | Admitting: *Deleted

## 2020-02-10 VITALS — BP 135/89 | HR 91

## 2020-02-10 DIAGNOSIS — Z3A34 34 weeks gestation of pregnancy: Secondary | ICD-10-CM

## 2020-02-14 ENCOUNTER — Other Ambulatory Visit: Payer: Self-pay

## 2020-02-14 ENCOUNTER — Other Ambulatory Visit (HOSPITAL_COMMUNITY)
Admission: RE | Admit: 2020-02-14 | Discharge: 2020-02-14 | Disposition: A | Discharge status: Home / Self Care | Payer: PRIVATE HEALTH INSURANCE | Source: Ambulatory Visit | Attending: Advanced Practice Midwife | Admitting: Advanced Practice Midwife

## 2020-02-14 ENCOUNTER — Ambulatory Visit (INDEPENDENT_AMBULATORY_CARE_PROVIDER_SITE_OTHER): Payer: PRIVATE HEALTH INSURANCE | Admitting: Advanced Practice Midwife

## 2020-02-14 VITALS — BP 129/84 | HR 79 | Wt 255.0 lb

## 2020-02-14 DIAGNOSIS — Z3403 Encounter for supervision of normal first pregnancy, third trimester: Secondary | ICD-10-CM | POA: Insufficient documentation

## 2020-02-14 DIAGNOSIS — Z7189 Other specified counseling: Secondary | ICD-10-CM

## 2020-02-14 DIAGNOSIS — O133 Gestational [pregnancy-induced] hypertension without significant proteinuria, third trimester: Secondary | ICD-10-CM | POA: Diagnosis not present

## 2020-02-14 DIAGNOSIS — Z3A34 34 weeks gestation of pregnancy: Secondary | ICD-10-CM

## 2020-02-14 NOTE — Patient Instructions (Signed)
Things to Try After 37 weeks to Encourage Labor/Get Ready for Labor:   1.  Try the [employer] at [email or web address].[email or web address] daily to improve baby's position and encourage the onset of labor.  2. Walk a little and rest a little every day.  Change positions often.  3. Cervical Ripening: May try one or both a. Red Raspberry Leaf capsules or tea:  two 300mg  or 400mg  tablets with each meal, 2-3 times a day, or 1-3 cups of tea daily  Potential Side Effects Of Raspberry Leaf:  Most women do not experience any side effects from drinking raspberry leaf tea. However, nausea and loose stools are possible   b. Evening Primrose Oil capsules: may take 1 to 3 capsules daily. Take 1-2 capsules by mouth each day and place one capsule vaginally at night.  You may also prick the vaginal capsule to release the oil prior to inserting in the vagina. Some of the potential side effects:  Upset stomach  Loose stools or diarrhea  Headaches  Nausea  4. Sex (and especially sex with orgasm) can also help the cervix ripen and encourage labor onset.    Reasons to return to [facility] at [facility]:  1.  Contractions are  5 minutes apart or less, each last 1 minute, these have been going on for 1-2 hours, and you cannot walk or talk during them 2.  You have a large gush of fluid, or a trickle of fluid that will not stop and you have to wear a pad 3.  You have bleeding that is bright red, heavier than spotting--like menstrual bleeding (spotting can be normal in early labor or after a check of your cervix) 4.  You do not feel the baby moving like he/she normally does

## 2020-02-14 NOTE — Progress Notes (Signed)
   PRENATAL VISIT NOTE  Subjective:  Carla Hall is a 24 y.o. G1P0000 at [redacted]w[redacted]d being seen today for ongoing prenatal care.  She is currently monitored for the following issues for this low-risk pregnancy and has Mild intermittent asthma; Encounter for supervision of normal first pregnancy in second trimester; Obesity in pregnancy; and Gestational hypertension, third trimester on their problem list.  Patient reports no complaints.  Contractions: Not present. Vag. Bleeding: None.  Movement: Present. Denies leaking of fluid.   The following portions of the patient's history were reviewed and updated as appropriate: allergies, current medications, past family history, past medical history, past social history, past surgical history and problem list.   Objective:   Vitals:   02/14/20 0906  Weight: 255 lb (115.7 kg)    Fetal Status:     Movement: Present  Presentation: Vertex  General:  Alert, oriented and cooperative. Patient is in no acute distress.  Skin: Skin is warm and dry. No rash noted.   Cardiovascular: Normal heart rate noted  Respiratory: Normal respiratory effort, no problems with respiration noted  Abdomen: Soft, gravid, appropriate for gestational age.  Pain/Pressure: Absent     Pelvic: Cervical exam performed in the presence of a chaperone        Extremities: Normal range of motion.  Edema: Mild pitting, slight indentation  Mental Status: Normal mood and affect. Normal behavior. Normal judgment and thought content.   Assessment and Plan:  Pregnancy: G1P0000 at [redacted]w[redacted]d 1. [redacted] weeks gestation of pregnancy --Anticipatory guidance about next visits/weeks of pregnancy given. --NST reactive today, good fetal movement.  Answered questions about last weeks of pregnancy, COVID testing and what happens if COVID is positive at hospital, and planned IOL for HTN --Continue twice weekly testing with NST/AFI this week, BPP and growth next week with MFM --IOL 9/29 with outpatient FB  9/28 --Discussed labor readiness/cervical ripening including EPO, raspberry leaf tea, and the [employer] - Cervicovaginal ancillary only( ) - Culture, Grp B Strep w/Rflx Suscept   COVID-19 Vaccine Counseling: The patient was counseled on the potential benefits and lack of known risks of COVID vaccination, during pregnancy and breastfeeding, during today's visit. The patient's questions and concerns were addressed today, including safety of the vaccination and potential side effects as they have been published by ACOG and SMFM. The patient has been informed that there have not been any documented vaccine related injuries, deaths or birth defects to infant or mom after receiving the COVID-19 vaccine to date. The patient has been made aware that although she is not at increased risk of contracting COVID-19 during pregnancy, she is at increased risk of developing severe disease and complications if she contracts COVID-19 while pregnant. All patient questions were addressed during our visit today. The patient is still unsure of her decision for vaccination.   Preterm labor symptoms and general obstetric precautions including but not limited to vaginal bleeding, contractions, leaking of fluid and fetal movement were reviewed in detail with the patient. Please refer to After Visit Summary for other counseling recommendations.    No follow-ups on file.  Future Appointments  Date Time Provider Department Center  02/17/2020  9:00 AM Granville Lewis, RN [facility]  02/21/2020  8:00 AM [facility] NURSE [facility]  02/21/2020  8:15 AM [facility] US2 [facility]  02/28/2020  8:55 AM Sharyon Cable, CNM [facility]    Sharen Counter, CNM

## 2020-02-15 LAB — CERVICOVAGINAL ANCILLARY ONLY
Chlamydia: NEGATIVE
Comment: NEGATIVE
Comment: NORMAL
Neisseria Gonorrhea: NEGATIVE

## 2020-02-16 ENCOUNTER — Telehealth (HOSPITAL_COMMUNITY): Payer: Self-pay | Admitting: *Deleted

## 2020-02-16 NOTE — Telephone Encounter (Signed)
Preadmission screen  

## 2020-02-17 ENCOUNTER — Ambulatory Visit (INDEPENDENT_AMBULATORY_CARE_PROVIDER_SITE_OTHER): Payer: PRIVATE HEALTH INSURANCE | Admitting: *Deleted

## 2020-02-17 ENCOUNTER — Other Ambulatory Visit: Payer: Self-pay

## 2020-02-17 VITALS — Wt 252.0 lb

## 2020-02-17 DIAGNOSIS — Z3A35 35 weeks gestation of pregnancy: Secondary | ICD-10-CM

## 2020-02-17 LAB — CULTURE, STREPTOCOCCUS GRP B W/SUSCEPT
MICRO NUMBER:: 10952053
SPECIMEN QUALITY:: ADEQUATE

## 2020-02-17 NOTE — Progress Notes (Signed)
Pt here for NST only which was reactive and reviewed by Jesse Fall

## 2020-02-21 ENCOUNTER — Other Ambulatory Visit: Payer: Self-pay | Admitting: Family Medicine

## 2020-02-21 ENCOUNTER — Other Ambulatory Visit: Payer: Self-pay

## 2020-02-21 ENCOUNTER — Ambulatory Visit: Payer: PRIVATE HEALTH INSURANCE | Admitting: *Deleted

## 2020-02-21 ENCOUNTER — Other Ambulatory Visit: Payer: Self-pay | Admitting: Advanced Practice Midwife

## 2020-02-21 ENCOUNTER — Encounter: Payer: Self-pay | Admitting: *Deleted

## 2020-02-21 ENCOUNTER — Ambulatory Visit: Payer: PRIVATE HEALTH INSURANCE | Attending: Obstetrics and Gynecology

## 2020-02-21 DIAGNOSIS — Z3402 Encounter for supervision of normal first pregnancy, second trimester: Secondary | ICD-10-CM | POA: Insufficient documentation

## 2020-02-21 DIAGNOSIS — Z362 Encounter for other antenatal screening follow-up: Secondary | ICD-10-CM

## 2020-02-21 DIAGNOSIS — O99213 Obesity complicating pregnancy, third trimester: Secondary | ICD-10-CM | POA: Diagnosis not present

## 2020-02-21 DIAGNOSIS — Z3A35 35 weeks gestation of pregnancy: Secondary | ICD-10-CM

## 2020-02-21 DIAGNOSIS — O0990 Supervision of high risk pregnancy, unspecified, unspecified trimester: Secondary | ICD-10-CM

## 2020-02-21 DIAGNOSIS — Z3689 Encounter for other specified antenatal screening: Secondary | ICD-10-CM | POA: Diagnosis not present

## 2020-02-21 DIAGNOSIS — E669 Obesity, unspecified: Secondary | ICD-10-CM

## 2020-02-21 DIAGNOSIS — O133 Gestational [pregnancy-induced] hypertension without significant proteinuria, third trimester: Secondary | ICD-10-CM

## 2020-02-21 DIAGNOSIS — O99210 Obesity complicating pregnancy, unspecified trimester: Secondary | ICD-10-CM | POA: Diagnosis present

## 2020-02-23 ENCOUNTER — Telehealth (HOSPITAL_COMMUNITY): Payer: Self-pay | Admitting: *Deleted

## 2020-02-23 NOTE — Telephone Encounter (Signed)
Preadmission screen  

## 2020-02-24 ENCOUNTER — Telehealth (HOSPITAL_COMMUNITY): Payer: Self-pay | Admitting: *Deleted

## 2020-02-24 NOTE — Telephone Encounter (Signed)
Preadmission screen  

## 2020-02-27 ENCOUNTER — Other Ambulatory Visit (HOSPITAL_COMMUNITY)
Admission: RE | Admit: 2020-02-27 | Discharge: 2020-02-27 | Disposition: A | Discharge status: Home / Self Care | Payer: PRIVATE HEALTH INSURANCE | Source: Ambulatory Visit | Attending: Family Medicine | Admitting: Family Medicine

## 2020-02-27 ENCOUNTER — Telehealth: Payer: Self-pay

## 2020-02-27 ENCOUNTER — Encounter (HOSPITAL_COMMUNITY): Payer: Self-pay

## 2020-02-27 ENCOUNTER — Telehealth: Payer: Self-pay | Admitting: *Deleted

## 2020-02-27 DIAGNOSIS — Z01812 Encounter for preprocedural laboratory examination: Secondary | ICD-10-CM | POA: Insufficient documentation

## 2020-02-27 DIAGNOSIS — Z20822 Contact with and (suspected) exposure to covid-19: Secondary | ICD-10-CM | POA: Insufficient documentation

## 2020-02-27 LAB — SARS CORONAVIRUS 2 (TAT 6-24 HRS): SARS Coronavirus 2: NEGATIVE

## 2020-02-27 NOTE — Telephone Encounter (Signed)
Not able to leave patient a message to contact the pre admission department, [facility] at [phone] due to mailbox being full. Patient did not show up for COVID test on 02/27/20 at 9:30 AM.

## 2020-02-27 NOTE — Telephone Encounter (Signed)
Attempted to call pt to let her know that preadmission has been trying to contact her about getting her COVID test before IOL. Pt did not answer and voicemail box is full.

## 2020-02-27 NOTE — Telephone Encounter (Signed)
Spoke with pt's husband about pt missing her COVID test this morning. Pt's husband is aware that pt needs to go to [address] [place] before 3:00 today in order to get COVID test before IOL. Pt's husband states he will get in contact with her and give her this information.

## 2020-02-28 ENCOUNTER — Ambulatory Visit (INDEPENDENT_AMBULATORY_CARE_PROVIDER_SITE_OTHER): Payer: PRIVATE HEALTH INSURANCE | Admitting: Certified Nurse Midwife

## 2020-02-28 ENCOUNTER — Encounter: Payer: Self-pay | Admitting: Certified Nurse Midwife

## 2020-02-28 ENCOUNTER — Other Ambulatory Visit: Payer: Self-pay

## 2020-02-28 VITALS — BP 140/88 | HR 92 | Wt 258.0 lb

## 2020-02-28 DIAGNOSIS — Z3403 Encounter for supervision of normal first pregnancy, third trimester: Secondary | ICD-10-CM

## 2020-02-28 DIAGNOSIS — O133 Gestational [pregnancy-induced] hypertension without significant proteinuria, third trimester: Secondary | ICD-10-CM

## 2020-02-28 DIAGNOSIS — Z3A36 36 weeks gestation of pregnancy: Secondary | ICD-10-CM | POA: Diagnosis not present

## 2020-02-28 NOTE — Progress Notes (Signed)
   PRENATAL VISIT NOTE  Subjective:  Carla Hall is a 24 y.o. G1P0000 at [redacted]w[redacted]d being seen today for ongoing prenatal care.  She is currently monitored for the following issues for this high-risk pregnancy and has Mild intermittent asthma; Encounter for supervision of normal first pregnancy in second trimester; Obesity in pregnancy; and Gestational hypertension, third trimester on their problem list.  Patient reports no complaints.  Contractions: Irritability. Vag. Bleeding: None.  Movement: Present. Denies leaking of fluid.   The following portions of the patient's history were reviewed and updated as appropriate: allergies, current medications, past family history, past medical history, past social history, past surgical history and problem list. Problem list updated.  Objective:   Vitals:   02/28/20 1456  BP: 140/88  Pulse: 92  Weight: 258 lb (117 kg)    Fetal Status: Fetal Heart Rate (bpm): NST-R   Movement: Present  Presentation: Vertex  General:  Alert, oriented and cooperative. Patient is in no acute distress.  Skin: Skin is warm and dry. No rash noted.   Cardiovascular: Normal heart rate noted  Respiratory: Normal respiratory effort, no problems with respiration noted  Abdomen: Soft, gravid, appropriate for gestational age.  Pain/Pressure: Present     Pelvic: Cervical exam performed Dilation: Fingertip Effacement (%): Thick Station: Ballotable  Extremities: Normal range of motion.     Mental Status:  Normal mood and affect. Normal behavior. Normal judgment and thought content.  Procedure: Patient informed of R/B/A of procedure. NST was performed and was reactive prior to procedure. NST:  EFM: Baseline: 150 bpm, Variability: Good {> 6 bpm), Accelerations: Reactive and Decelerations: Absent Toco: UI Procedure done to begin ripening of the cervix prior to admission for induction of labor. Appropriate time out taken. The patient was placed in the lithotomy position and the  cervix brought into view with sterile speculum. A ring forcep was used to guide the 65F foley through the internal os of the cervix. Foley Balloon filled with 60cc of sterile water. Plug inserted into end of the foley. Foley placed on tension and taped to medial thigh.  NST:  EFM Baseline: 150 bpm, Variability: Good {> 6 bpm), Accelerations: Reactive and Decelerations: Absent  Toco: UI There were no signs of tachysystole or hypertonus. All equipment was removed and accounted for. The patient tolerated the procedure well.  Assessment and Plan:  Pregnancy: G1P0000 at [redacted]w[redacted]d 1. [redacted] weeks gestation of pregnancy - IOL scheduled for 9/28 AM   2. Encounter for supervision of normal first pregnancy in third trimester - Discussed reasons to present to MAU  - FB risk/benefits discussed   3. Gestational hypertension, third trimester - BP 140/88 today - orders for admission placed by Sharen Counter CNM  - Patient denies s/s [facility]    S/p Outpatient placement of foley balloon catheter for cervical ripening. Induction of labor scheduled for tomorrow at 0700 am. Reassuring FHR tracing with no concerns at present. Warning signs given to patient to include return to MAU for heavy vaginal bleeding, Rupture of membranes, painful uterine contractions q 5 mins or less, severe abdominal discomfort, decreased fetal movement.  Sharyon Cable, CNM 02/28/2020 4:04 PM

## 2020-02-28 NOTE — Patient Instructions (Signed)
Balloon Catheter Placement for Cervical Ripening Balloon catheter placement for cervical ripening is a procedure to help your cervix start to soften (ripen) and open (dilate). It is done to prepare your body for labor induction. During this procedure, a thin tube (catheter) is placed through your cervix. A tiny balloon attached to the catheter is inflated with water. Pressure from the balloon is what helps your cervix start to open. Cervical ripening with a balloon catheter can make labor induction shorter and easier. You may have this procedure if:  Your cervix is not ready for labor.  Your health care provider has planned labor induction.  You are not having twins or multiples.  Your baby is in the head-down position.  You do not have any other pregnancy complications that require you to be monitored in the hospital after balloon catheter placement. If your health care provider has recommended labor induction to stimulate a vaginal birth, this procedure may be started the day before induction. You will go home with the balloon in place and return to start induction in 12-24 hours. You may have this procedure and stay in the hospital so that your progress can be monitored as well. Tell a health care provider about:  Any allergies you have.  All medicines you are taking, including vitamins, herbs, eye drops, creams, and over-the-counter medicines.  Any blood disorders you have.  Any surgeries you have had.  Any medical conditions you have. What are the risks? Generally, this is a safe procedure. However, problems may occur, including:  Infection.  Bleeding.  Cramping or pain.  Difficulty passing urine.  The baby moving from the head-down position to a position with the feet or buttocks down (breech position). What happens before the procedure?  Your health care provider may check your baby's heartbeat (fetal monitoring) before the procedure.  You may be asked to empty your  bladder. What happens during the procedure?   You will be positioned on the exam table as if you were having a pelvic exam or Pap test.  Your health care provider may insert a medical instrument into your vagina (speculum) to see your cervix.  Your cervix may be cleaned with a germ-killing solution.  The catheter will be inserted through the opening of your cervix.  A balloon on the end of the catheter will be inflated with sterile water. Some catheters have two balloons, one on each side of the cervix.  Depending on the type of balloon catheter, the end of the catheter may be left free outside your cervix or taped to your leg. The procedure may vary among health care providers and hospitals. What can I expect after the procedure?  After the procedure, it is common to have: ? A feeling of pressure inside your vagina. ? Light vaginal bleeding (spotting).  You may have fetal monitoring before going home.  You may be sent home with the catheter in place and asked to return to start your induction in about 12-24 hours. Follow these instructions at home:  Take over-the-counter and prescription medicines only as told by your health care provider.  Return to your normal activities at home as told by your health care provider. Ask your health care provider what activities are safe for you. Do not leave home until you return for labor induction.  You may shower at home. Do not take baths, swim, or use a hot tub unless your health care provider approves.  As your cervix opens, your catheter and balloon may   fall out before you return for labor induction. Ask your health care provider what you should do if this happens.  Keep all follow-up visits as told by your health care provider. This is important. You will need to return to start induction as told by your health care provider. Contact your health care provider if:  You have chills or a fever.  You have constant pain or cramps (not  contractions).  You have trouble passing urine.  Your water breaks.  You have vaginal bleeding that is heavier than spotting.  You have contractions that start to last longer and come closer together (about every 5 minutes).  The balloon catheter falls out before you return to start your induction. Summary  Cervical ripening with placement of a balloon catheter is an outpatient procedure to prepare you for labor induction.  Cervical ripening with a balloon catheter helps your cervix start to open for birth.  You will be positioned on the exam table. The catheter will be inserted through the opening of your cervix. A balloon on the end of the catheter will be inflated with water.  Pressure from the balloon will cause ripening of your cervix. You will go home with the balloon in place and return to start induction in 12-24 hours.  Contact your health care provider if you have pain, fever, vaginal bleeding, or trouble passing urine. Also contact him or her if your water breaks, you start to go into labor, or your balloon catheter falls out before you return to start your induction. This information is not intended to replace advice given to you by your health care provider. Make sure you discuss any questions you have with your health care provider. Document Revised: 01/15/2018 Document Reviewed: 01/15/2018 Elsevier Patient Education  2020 [employer].

## 2020-02-29 ENCOUNTER — Inpatient Hospital Stay (HOSPITAL_COMMUNITY)
Admission: AD | Admit: 2020-02-29 | Discharge: 2020-03-04 | DRG: 807 | Disposition: A | Discharge status: Home / Self Care | Payer: PRIVATE HEALTH INSURANCE | Attending: Obstetrics and Gynecology | Admitting: Obstetrics and Gynecology

## 2020-02-29 ENCOUNTER — Inpatient Hospital Stay (HOSPITAL_COMMUNITY): Payer: PRIVATE HEALTH INSURANCE

## 2020-02-29 ENCOUNTER — Inpatient Hospital Stay (HOSPITAL_COMMUNITY): Payer: PRIVATE HEALTH INSURANCE | Admitting: Anesthesiology

## 2020-02-29 ENCOUNTER — Encounter (HOSPITAL_COMMUNITY): Payer: Self-pay | Admitting: Family Medicine

## 2020-02-29 DIAGNOSIS — J4520 Mild intermittent asthma, uncomplicated: Secondary | ICD-10-CM | POA: Diagnosis present

## 2020-02-29 DIAGNOSIS — O99893 Other specified diseases and conditions complicating puerperium: Secondary | ICD-10-CM | POA: Diagnosis not present

## 2020-02-29 DIAGNOSIS — O9952 Diseases of the respiratory system complicating childbirth: Secondary | ICD-10-CM | POA: Diagnosis present

## 2020-02-29 DIAGNOSIS — O1410 Severe pre-eclampsia, unspecified trimester: Secondary | ICD-10-CM | POA: Diagnosis not present

## 2020-02-29 DIAGNOSIS — Z20822 Contact with and (suspected) exposure to covid-19: Secondary | ICD-10-CM | POA: Diagnosis present

## 2020-02-29 DIAGNOSIS — Z880 Allergy status to penicillin: Secondary | ICD-10-CM

## 2020-02-29 DIAGNOSIS — O99210 Obesity complicating pregnancy, unspecified trimester: Secondary | ICD-10-CM | POA: Diagnosis present

## 2020-02-29 DIAGNOSIS — O99214 Obesity complicating childbirth: Secondary | ICD-10-CM | POA: Diagnosis present

## 2020-02-29 DIAGNOSIS — Z8616 Personal history of COVID-19: Secondary | ICD-10-CM | POA: Diagnosis not present

## 2020-02-29 DIAGNOSIS — O1414 Severe pre-eclampsia complicating childbirth: Secondary | ICD-10-CM | POA: Diagnosis present

## 2020-02-29 DIAGNOSIS — Z9889 Other specified postprocedural states: Secondary | ICD-10-CM | POA: Diagnosis not present

## 2020-02-29 DIAGNOSIS — O134 Gestational [pregnancy-induced] hypertension without significant proteinuria, complicating childbirth: Secondary | ICD-10-CM | POA: Diagnosis present

## 2020-02-29 DIAGNOSIS — Z3402 Encounter for supervision of normal first pregnancy, second trimester: Secondary | ICD-10-CM

## 2020-02-29 DIAGNOSIS — R55 Syncope and collapse: Secondary | ICD-10-CM | POA: Diagnosis not present

## 2020-02-29 DIAGNOSIS — Z3A37 37 weeks gestation of pregnancy: Secondary | ICD-10-CM

## 2020-02-29 DIAGNOSIS — E669 Obesity, unspecified: Secondary | ICD-10-CM | POA: Diagnosis present

## 2020-02-29 DIAGNOSIS — O133 Gestational [pregnancy-induced] hypertension without significant proteinuria, third trimester: Secondary | ICD-10-CM | POA: Diagnosis present

## 2020-02-29 LAB — CBC
HCT: 31.1 % — ABNORMAL LOW (ref 36.0–46.0)
HCT: 30.5 % — ABNORMAL LOW (ref 36.0–46.0)
Hemoglobin: 10.1 g/dL — ABNORMAL LOW (ref 12.0–15.0)
Hemoglobin: 10.1 g/dL — ABNORMAL LOW (ref 12.0–15.0)
MCH: 29 pg (ref 26.0–34.0)
MCH: 28.9 pg (ref 26.0–34.0)
MCHC: 32.5 g/dL (ref 30.0–36.0)
MCHC: 33.1 g/dL (ref 30.0–36.0)
MCV: 89.4 fL (ref 80.0–100.0)
MCV: 87.4 fL (ref 80.0–100.0)
Platelets: 269 10*3/uL (ref 150–400)
Platelets: 272 10*3/uL (ref 150–400)
RBC: 3.48 MIL/uL — ABNORMAL LOW (ref 3.87–5.11)
RBC: 3.49 MIL/uL — ABNORMAL LOW (ref 3.87–5.11)
RDW: 14.2 % (ref 11.5–15.5)
RDW: 14 % (ref 11.5–15.5)
WBC: 11.9 10*3/uL — ABNORMAL HIGH (ref 4.0–10.5)
WBC: 12.1 10*3/uL — ABNORMAL HIGH (ref 4.0–10.5)
nRBC: 0 % (ref 0.0–0.2)
nRBC: 0 % (ref 0.0–0.2)

## 2020-02-29 LAB — RPR: RPR Ser Ql: NONREACTIVE

## 2020-02-29 LAB — COMPREHENSIVE METABOLIC PANEL
ALT: 15 U/L (ref 0–44)
AST: 18 U/L (ref 15–41)
Albumin: 2.6 g/dL — ABNORMAL LOW (ref 3.5–5.0)
Alkaline Phosphatase: 121 U/L (ref 38–126)
Anion gap: 12 (ref 5–15)
BUN: 5 mg/dL — ABNORMAL LOW (ref 6–20)
CO2: 18 mmol/L — ABNORMAL LOW (ref 22–32)
Calcium: 8.9 mg/dL (ref 8.9–10.3)
Chloride: 107 mmol/L (ref 98–111)
Creatinine, Ser: 0.71 mg/dL (ref 0.44–1.00)
GFR calc Af Amer: >60 mL/min (ref >60)
GFR calc non Af Amer: >60 mL/min (ref >60)
Glucose, Bld: 88 mg/dL (ref 70–99)
Potassium: 3.2 mmol/L — ABNORMAL LOW (ref 3.5–5.1)
Sodium: 137 mmol/L (ref 135–145)
Total Bilirubin: 0.5 mg/dL (ref 0.3–1.2)
Total Protein: 6.3 g/dL — ABNORMAL LOW (ref 6.5–8.1)

## 2020-02-29 LAB — TYPE AND SCREEN
ABO/RH(D): A POS
Antibody Screen: NEGATIVE

## 2020-02-29 LAB — PROTEIN / CREATININE RATIO, URINE
Creatinine, Urine: 104.11 mg/dL
Protein Creatinine Ratio: 0.45 mg/mg{Cre} — ABNORMAL HIGH (ref 0.00–0.15)
Total Protein, Urine: 47 mg/dL

## 2020-02-29 MED ORDER — TERBUTALINE SULFATE 1 MG/ML IJ SOLN: 0.2500 mg | Freq: Once | INTRAMUSCULAR | Status: DC | PRN

## 2020-02-29 MED ORDER — OXYCODONE-ACETAMINOPHEN 5-325 MG PO TABS: 2.0000 | ORAL_TABLET | ORAL | Status: DC | PRN

## 2020-02-29 MED ORDER — SOD CITRATE-CITRIC ACID 500-334 MG/5ML PO SOLN
30.0000 mL | ORAL | Status: DC | PRN
  Administered 2020-03-01 (×2): 30 mL via ORAL
  Filled 2020-02-29 (×2): qty 15

## 2020-02-29 MED ORDER — OXYTOCIN BOLUS FROM INFUSION
333.0000 mL | Freq: Once | INTRAVENOUS | Status: AC
  Administered 2020-03-02: 333 mL via INTRAVENOUS

## 2020-02-29 MED ORDER — PHENYLEPHRINE 40 MCG/ML (10ML) SYRINGE FOR IV PUSH (FOR BLOOD PRESSURE SUPPORT): 80.0000 ug | PREFILLED_SYRINGE | INTRAVENOUS | Status: DC | PRN

## 2020-02-29 MED ORDER — ACETAMINOPHEN 325 MG PO TABS
650.0000 mg | ORAL_TABLET | ORAL | Status: DC | PRN
  Administered 2020-03-01: 650 mg via ORAL
  Filled 2020-02-29 (×2): qty 2

## 2020-02-29 MED ORDER — PHENYLEPHRINE 40 MCG/ML (10ML) SYRINGE FOR IV PUSH (FOR BLOOD PRESSURE SUPPORT)
80.0000 ug | PREFILLED_SYRINGE | INTRAVENOUS | Status: DC | PRN
  Filled 2020-02-29: qty 10

## 2020-02-29 MED ORDER — FENTANYL-BUPIVACAINE-NACL 0.5-0.125-0.9 MG/250ML-% EP SOLN
12.0000 mL/h | EPIDURAL | Status: DC | PRN
  Filled 2020-02-29 (×2): qty 250

## 2020-02-29 MED ORDER — EPHEDRINE 5 MG/ML INJ: 10.0000 mg | INTRAVENOUS | Status: DC | PRN

## 2020-02-29 MED ORDER — MISOPROSTOL 50MCG HALF TABLET
50.0000 ug | ORAL_TABLET | ORAL | Status: DC | PRN
  Administered 2020-02-29 (×2): 50 ug via ORAL
  Filled 2020-02-29 (×2): qty 1

## 2020-02-29 MED ORDER — SODIUM CHLORIDE (PF) 0.9 % IJ SOLN
INTRAMUSCULAR | Status: DC | PRN
Start: 2020-02-29 — End: 2020-03-02
  Administered 2020-02-29: 12 mL/h via EPIDURAL

## 2020-02-29 MED ORDER — LIDOCAINE HCL (PF) 1 % IJ SOLN
INTRAMUSCULAR | Status: DC | PRN
  Administered 2020-02-29: 10 mL via EPIDURAL

## 2020-02-29 MED ORDER — ONDANSETRON HCL 4 MG/2ML IJ SOLN
4.0000 mg | Freq: Four times a day (QID) | INTRAMUSCULAR | Status: DC | PRN
  Administered 2020-03-01 (×3): 4 mg via INTRAVENOUS
  Filled 2020-02-29 (×3): qty 2

## 2020-02-29 MED ORDER — LACTATED RINGERS IV SOLN: 500.0000 mL | Freq: Once | INTRAVENOUS | Status: DC

## 2020-02-29 MED ORDER — LIDOCAINE HCL (PF) 1 % IJ SOLN: 30.0000 mL | INTRAMUSCULAR | Status: DC | PRN

## 2020-02-29 MED ORDER — DIPHENHYDRAMINE HCL 50 MG/ML IJ SOLN: 12.5000 mg | INTRAMUSCULAR | Status: DC | PRN

## 2020-02-29 MED ORDER — LACTATED RINGERS IV SOLN: 500.0000 mL | INTRAVENOUS | Status: DC | PRN

## 2020-02-29 MED ORDER — OXYTOCIN-SODIUM CHLORIDE 30-0.9 UT/500ML-% IV SOLN
1.0000 m[IU]/min | INTRAVENOUS | Status: DC
  Administered 2020-02-29: 2 m[IU]/min via INTRAVENOUS
  Filled 2020-02-29: qty 500

## 2020-02-29 MED ORDER — OXYCODONE-ACETAMINOPHEN 5-325 MG PO TABS: 1.0000 | ORAL_TABLET | ORAL | Status: DC | PRN

## 2020-02-29 MED ORDER — ACETAMINOPHEN 500 MG PO TABS
1000.0000 mg | ORAL_TABLET | Freq: Once | ORAL | Status: AC
  Administered 2020-02-29: 1000 mg via ORAL
  Filled 2020-02-29: qty 2

## 2020-02-29 MED ORDER — OXYTOCIN-SODIUM CHLORIDE 30-0.9 UT/500ML-% IV SOLN
2.5000 [IU]/h | INTRAVENOUS | Status: DC
  Administered 2020-03-02: 2.5 [IU]/h via INTRAVENOUS
  Filled 2020-02-29: qty 500

## 2020-02-29 MED ORDER — LACTATED RINGERS IV SOLN: INTRAVENOUS | Status: DC

## 2020-02-29 NOTE — Progress Notes (Signed)
Patient ID: Carla Hall, female   DOB: 08/26/1995, 24 y.o.   MRN: [id number]   Feels crampy; having back discomfort from the bed; otherwise doing okay; Pitocin was started at 1800 EFW 7+2 at 36wks   BPs 150/84, 158/97 FHR 125-130, +accels, no decels Ctx irreg with Pit at 43mu/min Cx deferred (was 5/60 at 1745)  IUP@37 .0wks Pre-e without SF IOL process  Continue to titrate Pit to achieve adequate labor Tylenol for back discomfort Plan for epidural later this evening, with cervical exam after she's comfortable Anticipate vag del  Arabella Merles CNM 02/29/2020

## 2020-02-29 NOTE — Anesthesia Procedure Notes (Signed)
Epidural Patient location during procedure: OB Start time: 02/29/2020 11:08 PM End time: 02/29/2020 11:18 PM  Staffing Anesthesiologist: Lucretia Kern, MD Performed: anesthesiologist   Preanesthetic Checklist Completed: patient identified, IV checked, risks and benefits discussed, monitors and equipment checked, pre-op evaluation and timeout performed  Epidural Patient position: sitting Prep: DuraPrep Patient monitoring: heart rate, continuous pulse ox and blood pressure Approach: midline Location: L3-L4 Injection technique: LOR air  Needle:  Needle type: Tuohy  Needle gauge: 17 G Needle length: 9 cm Needle insertion depth: 7 cm Catheter type: closed end flexible Catheter size: 19 Gauge Catheter at skin depth: 12 cm Test dose: negative  Assessment Events: blood not aspirated, injection not painful, no injection resistance, no paresthesia and negative IV test  Additional Notes Reason for block:procedure for pain

## 2020-02-29 NOTE — Progress Notes (Signed)
LABOR PROGRESS NOTE  Carla Hall is a 24 y.o. G1P0000 at [redacted]w[redacted]d  admitted for IOL due to gestational hypertension.   Subjective: Doing well, not feeling or having any contractions. Just woke up from a nap.   Objective: BP (!) 141/84   Pulse 90   Temp 98 F (36.7 C) (Oral)   Resp 18   Ht 5\' 9"  (1.753 m)   Wt 114.9 kg   LMP 06/15/2019   BMI 37.42 kg/m  or  Vitals:   02/29/20 0838 02/29/20 0855 02/29/20 1149 02/29/20 1202  BP: (!) 145/91 (!) 152/94 (!) 166/97 (!) 141/84  Pulse: 95 100 91 90  Resp: 18     Temp: 98 F (36.7 C)     TempSrc: Oral     Weight:      Height:       Dilation: 5 Effacement (%): 50 Station: -2 Presentation: Vertex Exam by:: [email or web address], RNC FHT: baseline rate 135, moderate varibility, +acel, -decel Toco: irritable, no discrete contractions    Labs: Lab Results  Component Value Date   WBC 11.9 (H) 02/29/2020   HGB 10.1 (L) 02/29/2020   HCT 31.1 (L) 02/29/2020   MCV 89.4 02/29/2020   PLT 269 02/29/2020    Patient Active Problem List   Diagnosis Date Noted  . Gestational hypertension 02/29/2020  . Gestational hypertension, third trimester 01/17/2020  . Encounter for supervision of normal first pregnancy in second trimester 08/26/2019  . Obesity in pregnancy 08/26/2019  . Mild intermittent asthma 04/08/2018    Assessment / Plan: 24 y.o. G1P0000 at [redacted]w[redacted]d here for IOL due to gestational hypertension (now pre-eclampsia).   Labor: Minimal progression since last cervical check, would benefit from additional cervical softening prior to pit. Placed buccal cytotec, now s/p 2. Switch to pit 2x2 on next check.  Fetal Wellbeing: Cat 1 strip  Pain Control:  Desires epidural  Anticipated MOD:  SVD  #Pre-eclampsia without severe features:  PCR returned at 0.45. BP elevated but non-severe, last 141/84. Otherwise reminder of PEC labs wnl.  --Monitor BP, may add labetalol PRN  --Monitor sx      [redacted]w[redacted]d, DO Family Medicine PGY-3   02/29/2020, 1:58 PM

## 2020-02-29 NOTE — H&P (Signed)
OBSTETRIC ADMISSION HISTORY AND PHYSICAL  Carla Hall is a 24 y.o. female G1P0000 with IUP at [redacted]w[redacted]d by LMP presenting for IOL due to gestational hypertension. She had an outpatient FB placed prior to arrival. She reports +FMs, No LOF, no VB, no blurry vision, headaches or peripheral edema, and RUQ pain.  She plans on breast feeding. She request condoms for birth control. She received her prenatal care at [facility]   Dating: By LMP --->  Estimated Date of Delivery: 03/21/20  Sono:    @[redacted]w[redacted]d , CWD, normal anatomy, Cephalic presentation, 3237g, EFW   Prenatal History/Complications:  --Gestational hypertension, not on medications  --Mild intermittent asthma --Obesity (current BMI 37)  Past Medical History: Past Medical History:  Diagnosis Date  . H/O tympanostomy   . History of COVID-19   . Mild intermittent asthma 04/08/2018    Past Surgical History: Past Surgical History:  Procedure Laterality Date  . TYMPANOSTOMY TUBE PLACEMENT    . WISDOM TOOTH EXTRACTION      Obstetrical History: OB History    Gravida  1   Para  0   Term  0   Preterm  0   AB  0   Living  0     SAB  0   TAB  0   Ectopic  0   Multiple  0   Live Births  0           Social History Social History   Socioeconomic History  . Marital status: Married    Spouse name: Not on file  . Number of children: Not on file  . Years of education: Not on file  . Highest education level: Not on file  Occupational History  . Occupation: Non 13/11/2017: [employer]  Tobacco Use  . Smoking status: Never Smoker  . Smokeless tobacco: Never Used  Vaping Use  . Vaping Use: Never used  Substance and Sexual Activity  . Alcohol use: Not Currently    Comment: 1/WK  . Drug use: Never  . Sexual activity: Yes    Partners: Male    Birth control/protection: None  Other Topics Concern  . Not on file  Social History Narrative  . Not on file   Social Determinants of Health    Financial Resource Strain:   . Difficulty of Paying Living Expenses: Not on file  Food Insecurity:   . Worried About 06-[birth year]-1973 in the Last Year: Not on file  . Ran Out of Food in the Last Year: Not on file  Transportation Needs:   . Lack of Transportation (Medical): Not on file  . Lack of Transportation (Non-Medical): Not on file  Physical Activity:   . Days of Exercise per Week: Not on file  . Minutes of Exercise per Session: Not on file  Stress:   . Feeling of Stress : Not on file  Social Connections:   . Frequency of Communication with Friends and Family: Not on file  . Frequency of Social Gatherings with Friends and Family: Not on file  . Attends Religious Services: Not on file  . Active Member of Clubs or Organizations: Not on file  . Attends Programme researcher, broadcasting/film/video Meetings: Not on file  . Marital Status: Not on file    Family History: Family History  Problem Relation Age of Onset  . Diabetes Mellitus I Sister     Allergies: Allergies  Allergen Reactions  . Penicillins Rash  Medications Prior to Admission  Medication Sig Dispense Refill Last Dose  . albuterol (PROVENTIL HFA;VENTOLIN HFA) 108 (90 Base) MCG/ACT inhaler Inhale 2 puffs into the lungs every 6 (six) hours as needed for wheezing or shortness of breath. 1 Inhaler 0   . aspirin 81 MG chewable tablet Chew 1 tablet (81 mg total) by mouth daily. Start after 12 weeks 60 tablet 3   . calcium carbonate (TUMS - DOSED IN MG ELEMENTAL CALCIUM) 500 MG chewable tablet Chew 1 tablet by mouth daily.     . famotidine (PEPCID) 20 MG tablet Take 1 tablet (20 mg total) by mouth 2 (two) times daily. 60 tablet 6   . Prenatal Vit-Fe Fumarate-FA (PRENATAL VITAMIN PO) Take by mouth.        Review of Systems   All systems reviewed and negative except as stated in HPI  Blood pressure (!) 152/94, pulse 100, temperature 98 F (36.7 C), temperature source Oral, resp. rate 18, height 5\' 9"  (1.753 m), weight 114.9 kg,  last menstrual period 06/15/2019. General appearance: alert, cooperative and no distress Lungs: no increased WOB Abdomen: soft, non-tender; bowel sounds normal Extremities: Homans sign is negative, no sign of DVT Presentation: cephalic Fetal monitoringBaseline: 140 bpm, Variability: Good {> 6 bpm), Accelerations: Reactive and Decelerations: Absent Uterine activity irritable  Dilation: 5 Effacement (%): 50 Station: -2 Exam by:: [email or web address], RNC   Prenatal labs: ABO, Rh: --/--/A POS (09/29 0800) Antibody: NEG (09/29 0800) Rubella: 3.90 (03/26 0909) RPR: NON-REACTIVE (08/02 1015)  HBsAg: NON-REACTIVE (03/26 0909)  HIV: NON-REACTIVE (08/02 1015)  GBS:   negative  2 hr Glucola normal  Genetic screening normal  Anatomy 03-05-1979 normal female   Prenatal Transfer Tool  Maternal Diabetes: No Genetic Screening: Normal Maternal Ultrasounds/Referrals: Normal Fetal Ultrasounds or other Referrals:  None Maternal Substance Abuse:  No Significant Maternal Medications:  None Significant Maternal Lab Results: Group B Strep negative  Results for orders placed or performed during the hospital encounter of 02/29/20 (from the past 24 hour(s))  Type and screen   Collection Time: 02/29/20  8:00 AM  Result Value Ref Range   ABO/RH(D) A POS    Antibody Screen NEG    Sample Expiration      03/03/2020,2359 Performed at [facility], [address] [place], [place]   CBC   Collection Time: 02/29/20  8:08 AM  Result Value Ref Range   WBC 11.9 (H) 4.0 - 10.5 K/uL   RBC 3.48 (L) 3.87 - 5.11 MIL/uL   Hemoglobin 10.1 (L) 12.0 - 15.0 g/dL   HCT 03/02/20 (L) 36 - 46 %   MCV 89.4 80.0 - 100.0 fL   MCH 29.0 26.0 - 34.0 pg   MCHC 32.5 30.0 - 36.0 g/dL   RDW 97.3 53.2 - 99.2 %   Platelets 269 150 - 400 K/uL   nRBC 0.0 0.0 - 0.2 %  Comprehensive metabolic panel   Collection Time: 02/29/20  8:08 AM  Result Value Ref Range   Sodium 137 135 - 145 mmol/L   Potassium 3.2 (L) 3.5 - 5.1  mmol/L   Chloride 107 98 - 111 mmol/L   CO2 18 (L) 22 - 32 mmol/L   Glucose, Bld 88 70 - 99 mg/dL   BUN 5 (L) 6 - 20 mg/dL   Creatinine, Ser 03/02/20 0.44 - 1.00 mg/dL   Calcium 8.9 8.9 - 8.34 mg/dL   Total Protein 6.3 (L) 6.5 - 8.1 g/dL   Albumin 2.6 (L)  3.5 - 5.0 g/dL   AST 18 15 - 41 U/L   ALT 15 0 - 44 U/L   Alkaline Phosphatase 121 38 - 126 U/L   Total Bilirubin 0.5 0.3 - 1.2 mg/dL   GFR calc non Af Amer >60 >60 mL/min   GFR calc Af Amer >60 >60 mL/min   Anion gap 12 5 - 15    Patient Active Problem List   Diagnosis Date Noted  . Gestational hypertension 02/29/2020  . Gestational hypertension, third trimester 01/17/2020  . Encounter for supervision of normal first pregnancy in second trimester 08/26/2019  . Obesity in pregnancy 08/26/2019  . Mild intermittent asthma 04/08/2018    Assessment/Plan:  Carla Hall is a 24 y.o. G1P0000 at [redacted]w[redacted]d here for IOL due to gestational hypertension. Pregnancy additionally complicated by mild intermittent asthma and obesity.   #Labor: S/p OB FB with appropriate dilation, will proceed with cytotec x1 and hopefully transition to pit 2x2 on next check.  #Pain: Desires epidural  #FWB: Cat 1 strip  #ID: GBS negative  #MOF: Breastfeeding  #MOC: Condoms, will continue to discuss options  #Circ: Yes   #Gestational Hypertension:  BP elevated on admit without severe range. Current 152/94. Plts/CMP wnl, awaiting PCR.  --monitor BP  Allayne Stack, DO  02/29/2020, 9:54 AM

## 2020-02-29 NOTE — Anesthesia Preprocedure Evaluation (Signed)
Anesthesia Evaluation  Patient identified by MRN, date of birth, ID band Patient awake    Reviewed: Allergy & Precautions, H&P , NPO status , Patient's Chart, lab work & pertinent test results  History of Anesthesia Complications Negative for: history of anesthetic complications  Airway Mallampati: II  TM Distance: >3 FB Neck ROM: full    Dental no notable dental hx.    Pulmonary asthma ,    Pulmonary exam normal        Cardiovascular hypertension (pre-eclampsia with severe features), Normal cardiovascular exam Rhythm:regular Rate:Normal     Neuro/Psych negative neurological ROS  negative psych ROS   GI/Hepatic negative GI ROS, Neg liver ROS,   Endo/Other  negative endocrine ROS  Renal/GU negative Renal ROS  negative genitourinary   Musculoskeletal   Abdominal   Peds  Hematology negative hematology ROS (+)   Anesthesia Other Findings   Reproductive/Obstetrics (+) Pregnancy                             Anesthesia Physical Anesthesia Plan  ASA: III  Anesthesia Plan: Epidural   Post-op Pain Management:    Induction:   PONV Risk Score and Plan:   Airway Management Planned:   Additional Equipment:   Intra-op Plan:   Post-operative Plan:   Informed Consent: I have reviewed the patients History and Physical, chart, labs and discussed the procedure including the risks, benefits and alternatives for the proposed anesthesia with the patient or authorized representative who has indicated his/her understanding and acceptance.       Plan Discussed with:   Anesthesia Plan Comments:         Anesthesia Quick Evaluation

## 2020-02-29 NOTE — Progress Notes (Signed)
LABOR PROGRESS NOTE  Carla Hall is a 24 y.o. G1P0000 at [redacted]w[redacted]d  admitted for IOL due to pre-eclampsia.   Subjective: Doing well, feeling occasional discomfort   Objective: BP (!) 151/83   Pulse 76   Temp 98 F (36.7 C) (Oral)   Resp 18   Ht 5\' 9"  (1.753 m)   Wt 114.9 kg   LMP 06/15/2019   BMI 37.42 kg/m  or  Vitals:   02/29/20 1149 02/29/20 1202 02/29/20 1344 02/29/20 1538  BP: (!) 166/97 (!) 141/84 (!) 148/93 (!) 151/83  Pulse: 91 90 90 76  Resp:      Temp:      TempSrc:      Weight:      Height:        Dilation: 5 Effacement (%): 50 Station: -3 Presentation: Vertex Exam by:: Keigan Tafoya FHT: baseline rate 140, moderate varibility, +acel, -decel Toco: irritable   Labs: Lab Results  Component Value Date   WBC 11.9 (H) 02/29/2020   HGB 10.1 (L) 02/29/2020   HCT 31.1 (L) 02/29/2020   MCV 89.4 02/29/2020   PLT 269 02/29/2020    Patient Active Problem List   Diagnosis Date Noted  . Gestational hypertension 02/29/2020  . Gestational hypertension, third trimester 01/17/2020  . Encounter for supervision of normal first pregnancy in second trimester 08/26/2019  . Obesity in pregnancy 08/26/2019  . Mild intermittent asthma 04/08/2018    Assessment / Plan: 24 y.o. G1P0000 at [redacted]w[redacted]d here for IOL due to pre-eclampsia.   Labor: Minimal progression, will go ahead and start pit 2x2, titrate as tolerated.  Fetal Wellbeing:  Cat 1 strip  Pain Control:  Desires epidural  Anticipated MOD:  SVD  #Pre-eclampsia without severe features:  SBP 140-150's, no severe range. Asymptomatic.  --Monitor BP    [redacted]w[redacted]d, DO Family Medicine PGY-3  02/29/2020, 5:45 PM

## 2020-03-01 DIAGNOSIS — O1410 Severe pre-eclampsia, unspecified trimester: Secondary | ICD-10-CM | POA: Diagnosis not present

## 2020-03-01 LAB — MAGNESIUM: Magnesium: 3.5 mg/dL — ABNORMAL HIGH (ref 1.7–2.4)

## 2020-03-01 MED ORDER — BUTALBITAL-APAP-CAFFEINE 50-325-40 MG PO TABS
1.0000 | ORAL_TABLET | Freq: Once | ORAL | Status: AC
  Administered 2020-03-01: 1 via ORAL
  Filled 2020-03-01: qty 1

## 2020-03-01 MED ORDER — LABETALOL HCL 5 MG/ML IV SOLN
40.0000 mg | INTRAVENOUS | Status: DC | PRN
  Administered 2020-03-01: 40 mg via INTRAVENOUS
  Filled 2020-03-01 (×2): qty 8

## 2020-03-01 MED ORDER — LABETALOL HCL 5 MG/ML IV SOLN: 80.0000 mg | INTRAVENOUS | Status: DC | PRN

## 2020-03-01 MED ORDER — NIFEDIPINE 10 MG PO CAPS: 10.0000 mg | ORAL_CAPSULE | ORAL | Status: DC | PRN

## 2020-03-01 MED ORDER — BUTALBITAL-APAP-CAFFEINE 50-325-40 MG PO TABS
2.0000 | ORAL_TABLET | Freq: Once | ORAL | Status: AC
  Administered 2020-03-01: 2 via ORAL
  Filled 2020-03-01: qty 2

## 2020-03-01 MED ORDER — LABETALOL HCL 5 MG/ML IV SOLN
20.0000 mg | INTRAVENOUS | Status: DC | PRN
  Administered 2020-03-01 – 2020-03-02 (×3): 20 mg via INTRAVENOUS
  Filled 2020-03-01 (×4): qty 4

## 2020-03-01 MED ORDER — MAGNESIUM SULFATE 40 GM/1000ML IV SOLN
2.0000 g/h | INTRAVENOUS | Status: AC
  Administered 2020-03-02 – 2020-03-03 (×2): 2 g/h via INTRAVENOUS
  Filled 2020-03-01 (×3): qty 1000

## 2020-03-01 MED ORDER — NIFEDIPINE 10 MG PO CAPS: 20.0000 mg | ORAL_CAPSULE | ORAL | Status: DC | PRN

## 2020-03-01 MED ORDER — MAGNESIUM SULFATE BOLUS VIA INFUSION
4.0000 g | Freq: Once | INTRAVENOUS | Status: AC
  Administered 2020-03-01: 4 g via INTRAVENOUS
  Filled 2020-03-01: qty 1000

## 2020-03-01 MED ORDER — LABETALOL HCL 5 MG/ML IV SOLN
40.0000 mg | INTRAVENOUS | Status: DC | PRN
  Administered 2020-03-02: 40 mg via INTRAVENOUS

## 2020-03-01 MED ORDER — HYDRALAZINE HCL 20 MG/ML IJ SOLN: 10.0000 mg | INTRAMUSCULAR | Status: DC | PRN

## 2020-03-01 MED ORDER — LABETALOL HCL 5 MG/ML IV SOLN
INTRAVENOUS | Status: AC
  Administered 2020-03-01: 20 mg via INTRAVENOUS
  Filled 2020-03-01: qty 4

## 2020-03-01 MED ORDER — PROMETHAZINE HCL 25 MG/ML IJ SOLN
12.5000 mg | Freq: Four times a day (QID) | INTRAMUSCULAR | Status: DC | PRN
  Administered 2020-03-01: 12.5 mg via INTRAVENOUS
  Filled 2020-03-01: qty 1

## 2020-03-01 MED ORDER — OXYTOCIN-SODIUM CHLORIDE 30-0.9 UT/500ML-% IV SOLN
1.0000 m[IU]/min | INTRAVENOUS | Status: DC
  Administered 2020-03-01: 36 m[IU]/min via INTRAVENOUS
  Filled 2020-03-01: qty 500

## 2020-03-01 NOTE — Progress Notes (Signed)
Labor Progress Note Carla Hall is a 24 y.o. G1P0000 at [redacted]w[redacted]d presented for gHTN, now with PreE w/SF.  S: Patient doing well without complaints. Starting to feel more pressure.  O:  BP 132/68   Pulse 88   Temp 97.8 F (36.6 C) (Oral)   Resp 14   Ht 5\' 9"  (1.753 m)   Wt 114.9 kg   LMP 06/15/2019   SpO2 100%   BMI 37.42 kg/m  EFM: baseline 120/moderate variability/+accels, no decels Toco: every 2-3 minutes  Dilation: 4 Effacement (%): 60 Station: -2 Presentation: Vertex Exam by:: Dr. [email or web address]   A&P: 24 y.o. G1P0000 [redacted]w[redacted]d here for gHTN, now with preE w/ SF. #Labor: S/p OP FB (came out upon admission 9/29 AM). S/p cytotec x2. Pitocin break at 0600 and restarted at 1200 (9/30). AROM @1315 . No cervical change since admission, although some fetal descent. IUPC placed this exam. Currently pit@36cc /hr, will continue to monitor. #Pain: Epidural #FWB: Cat I, reassuring #GBS negative #Pre-E with severe features: Based on severe range blood pressures and P/C ratio. On Mg. Pressures elevated, 140's-150's systolic. Treat BP >160/110  11-12-1985, MD 6:04 PM

## 2020-03-01 NOTE — Progress Notes (Signed)
Labor Progress Note Carla Hall is a 24 y.o. G1P0000 at [redacted]w[redacted]d presented for gHTN S: Patient doing well. No complaints. Had syncopal episode earlier, feels better.   O:  BP (!) 145/88   Pulse 79   Temp 97.8 F (36.6 C) (Oral)   Resp 16   Ht 5\' 9"  (1.753 m)   Wt 114.9 kg   LMP 06/15/2019   BMI 37.42 kg/m  EFM: baseline 115/moderate variability/+accels, no decels  CVE: Dilation: 4 Effacement (%): 60 Station: -2 Presentation: Vertex Exam by:: Dr. [email or web address]   A&P: 24 y.o. G1P0000 [redacted]w[redacted]d here for gHTN #Labor: Has had Pit break since 6AM. Plan to restart high-dose Pit, 4x4.  #Pain: Epidural #FWB: Cat I, reassuring #GBS negative #Pre-E with severe features: Based on severe range blood pressures and P/C ratio. On Mg. Pressures elevated, 140's-150's systolic. Treat BP >160/110  [redacted]w[redacted]d, DO 11:44 AM

## 2020-03-01 NOTE — Progress Notes (Signed)
Labor Progress Note Carla Hall is a 24 y.o. G1P0000 at [redacted]w[redacted]d presented for gHTN, now with PreE w/SF.  S: Patient doing well without complaints. She is very anxious and tired, and has many questions about the plan going forward and her induction.   O:  BP (!) 153/84    Pulse 81    Temp 98 F (36.7 C) (Oral)    Resp 14    Ht 5\' 9"  (1.753 m)    Wt 114.9 kg    LMP 06/15/2019    SpO2 100%    BMI 37.42 kg/m  EFM: baseline 120/moderate variability/+accels, no decels Toco: previously intermittent, now every 4-7 minutes  Dilation: 4 Effacement (%): 60 Station: -2 Presentation: Vertex Exam by:: Dr. [email or web address]   A&P: 24 y.o. G1P0000 [redacted]w[redacted]d here for gHTN, now with preE w/ SF. #Labor: S/p OP FB (came out upon admission 9/29 AM). S/p cytotec x2. Pitocin break from 0600 until ~1130 this morning. No cervical change since admission, patient not yet in labor. Discussed induction process with patient and answered all questions. #Pain: Epidural #FWB: Cat I, reassuring #GBS negative #Pre-E with severe features: Based on severe range blood pressures and P/C ratio. On Mg. Pressures elevated, 140's-150's systolic. Treat BP >160/110  10/29, MD 12:45 PM

## 2020-03-01 NOTE — Progress Notes (Signed)
Paged to room for RN for sudden onset syncopal episode lasting ~10 seconds  S: Per patient, she remembers the event. She started feeling flushed/hot and nauseated. Per RN, patient's eyes rolled up to the top of her head and she passed out with heavy breathing. No tonic-clonic movements, limb-jerking, or tongue biting was noted. There was no post-ictal phase. Patient has no personal history of seizures or migraines, no family history of seizures, and no history of arrhythmias.  O:  Vitals:   03/01/20 0900 03/01/20 0930  BP: (!) 149/89 (!) 157/98  Pulse: 77 82  Resp: 16   Temp:     Neuro: CN II-XII grossly intact, no deficits in sensations to touch (patient has epidural, so physical testing was limited to feeling pressure, patient unable to move lower extremities well, but movements were symmetrical and similar bilaterally), no motor deficits noted in upper extremities General: Awake, alert, oriented x3 Heart: Good peripheral pulses, RRR w/o murmurs, gallops, or rubs Lungs: Non-labored breathing  A: 24 yo G1P0 at [redacted]w[redacted]d EGA here for IOL 2/2 pre-E w/SF by BP with P:Cr 0.45 - brief syncopal event  P: - likely vagal event in response to Magnesium for Pre-E w/SF - EKG: Normal sinus rhythm at 76 bpm. Normal P-R interval. Slightly long QRS complex, early R-wave progression through the pre-cordial leads. Non specific T wave changes, no priors for comparison. Overall no arrhythmias noted. - Mag level ordered - less likely: arrhythmia, seizure, migraine, TIA - Dr. Mayford Knife and Dr. Germaine Pomfret made aware  Salomon Mast, Margarette Asal, DO OB Fellow, Faculty Practice 03/01/2020 10:04 AM

## 2020-03-01 NOTE — Progress Notes (Signed)
Patient ID: [name], female   DOB: [birth year], 24 y.o.   MRN: [id number]  Required IV Lab x 2 doses for severe range BPs; head feels better and she overall feels well  BPs 163/103, 161/104, then 149/93 after meds FHR 125-130, +accels, no decels Ctx q 2-3 mins with Pit at 11mu/min Cx unchanged (my 3+/60/vtx -2, more anterior)  IUP@37 .1wks Pre-e w severe features (BP, P/C ratio) IOL process  Start mag sulfate now that pre-e is severe Stop Pit x 4h and then restart per high dose protocol at 1000 with AROM later today Hopeful for vag del  Arabella Merles CNM 03/01/2020

## 2020-03-01 NOTE — Progress Notes (Signed)
Patient ID: Carla Hall, female   DOB: 1995-07-03, 24 y.o.   MRN: [id number]   Comfortable w/ epidural; wants to try to rest once she is repositioned to side-lying  BPs 140/87, 144/94 FHR 120s, +accels, no decels, Cat 1 Ctx irreg 1-4 mins with Pit at 44mu/min Cx deferred (was unchanged after epidural placed)  IUP@37 .1wks Pre-e w/o SF IOL process  Continue to titrate Pit to 27mu/min; if cervix remains unchanged, will give a 4hr break and then restart Pit with increasing q 30 mins as FHR allows, then plan AROM  Arabella Merles CNM 03/01/2020

## 2020-03-01 NOTE — Progress Notes (Signed)
Patient ID: Carla Hall, female   DOB: 1995/11/23, 24 y.o.   MRN: [id number]  Just had some nausea/emesis- feeling better now after Zofran; feels some 'pressure' in her head; no visual disturbances  BPs 119/65, 131/70, 140/87 FHR 120-125, +accels, no decels, Cat 1 Ctx 2-4 mins with Pit just increased to 11mu/min Cx deferred  IUP@37 .2wks Pre-e w/o SF IOL process  Will try Fioricet #2 for her head; if still feels frontal pressure, will move to mag sulfate Plan to keep Pit dose going until 0600 at which time will reeval cx; if no change, will proceed w/ Pit break x 4h  Arabella Merles CNM 03/01/2020 4:13 AM

## 2020-03-01 NOTE — Progress Notes (Signed)
Labor Progress Note Carla Hall is a 24 y.o. G1P0000 at [redacted]w[redacted]d presented for gHTN, now with PreE w/SF.  S: Patient doing well without complaints.  O:  BP (!) 153/84   Pulse 81   Temp 98 F (36.7 C) (Oral)   Resp 14   Ht 5\' 9"  (1.753 m)   Wt 114.9 kg   LMP 06/15/2019   SpO2 100%   BMI 37.42 kg/m  EFM: baseline 120/moderate variability/+accels, no decels Toco: every 2-3 minutes  Dilation: 4 Effacement (%): 60 Station: -2 Presentation: Vertex Exam by:: Dr. [email or web address]   A&P: 24 y.o. G1P0000 [redacted]w[redacted]d here for gHTN, now with preE w/ SF. #Labor: S/p OP FB (came out upon admission 9/29 AM). S/p cytotec x2. Pitocin break from 0600 until ~1130 this morning, pitocin now restarted. No cervical change since admission, patient not yet in labor. AROM performed with clear fluid. Will continue to monitor. Discussed with Dr. 10/29. #Pain: Epidural #FWB: Cat I, reassuring #GBS negative #Pre-E with severe features: Based on severe range blood pressures and P/C ratio. On Mg. Pressures elevated, 140's-150's systolic. Treat BP >160/110  Mayford Knife, MD 3:18 PM

## 2020-03-01 NOTE — Progress Notes (Addendum)
LABOR PROGRESS NOTE  Carla Hall is a 25 y.o. G1P0000 at [redacted]w[redacted]d admitted for IOL for gHTN.  Subjective: Patient reports feeling contractions. No complaints at this time.  Objective: BP (!) 154/79   Pulse 85   Temp 98.1 F (36.7 C) (Oral)   Resp 18   Ht 5\' 9"  (1.753 m)   Wt 114.9 kg   LMP 06/15/2019   SpO2 100%   BMI 37.42 kg/m   Dilation: 4 Effacement (%): 60 Station: -1 Presentation: Vertex Exam by:: Dr. [email or web address] Fetal monitoring: Baseline: 110-120 bpm, Variability: Good {> 6 bpm), Accelerations: Reactive, and Decelerations: Absent Uterine activity: inade  Labs: Lab Results  Component Value Date   WBC 12.1 (H) 02/29/2020   HGB 10.1 (L) 02/29/2020   HCT 30.5 (L) 02/29/2020   MCV 87.4 02/29/2020   PLT 272 02/29/2020    Patient Active Problem List   Diagnosis Date Noted   Severe pre-eclampsia 03/01/2020   Gestational hypertension 02/29/2020   Gestational hypertension, third trimester 01/17/2020   Encounter for supervision of normal first pregnancy in second trimester 08/26/2019   Obesity in pregnancy 08/26/2019   Mild intermittent asthma 04/08/2018    Assessment / Plan: 24 y.o. G1P0000 [redacted]w[redacted]d here for gHTN, now with preE w/ SF.  #Labor: S/p OP FB (came out upon admission 9/29 AM). S/p cytotec x2. Pitocin break at 0600 and restarted at 1200 (9/30). AROM @1315 . No cervical change since admission, although some fetal descent. Fluctuating between adequate and inadequate contractions. Currently pit@36cc /hr, will continue to monitor. #Pain: Epidural #FWB: Cat I, reassuring #GBS negative #Pre-E with severe features: Based on severe range blood pressures and P/C ratio. On Mg. Pressures elevated, 140's-150's systolic. Treat BP >160/110  11-12-1985 MD, PGY-1 Family Medicine Resident, [facility] Faculty Teaching Service  03/01/2020, 8:49 PM   [facility]

## 2020-03-01 NOTE — Progress Notes (Signed)
At [phone], RN at bedside with  patient when she stated that she felt nauseous. As she reached for the emesis bag, the patient became unresponsive. She began breathing heavily and her eyes rolled back. After approximately 10 seconds, the patient was responsive. Philipp Deputy at bedside and witnessed event. Dr. Salomon Mast and Dr. Germaine Pomfret notified. Dr Salomon Mast came to bedside to assess and ordered EKG. Will continue to monitor.

## 2020-03-01 NOTE — Progress Notes (Signed)
Patient Vitals for the past 4 hrs:  BP Temp Temp src Pulse Resp SpO2  03/01/20 2300 127/70 -- -- 88 -- --  03/01/20 2230 127/71 -- -- 88 -- --  03/01/20 2200 122/68 -- -- 88 -- --  03/01/20 2131 128/73 -- -- 81 -- --  03/01/20 2116 (!) 147/76 -- -- 85 -- --  03/01/20 2100 (!) 172/93 -- -- 93 -- --  03/01/20 2046 (!) 162/89 -- -- 95 -- --  03/01/20 2031 (!) 146/85 -- -- 96 -- --  03/01/20 2016 (!) 154/79 97.9 F (36.6 C) Oral 85 18 --  03/01/20 2001 (!) 163/90 -- -- (!) 101 17 --  03/01/20 1945 (!) 159/87 -- -- 93 17 100 %   Comfortable w/epidural.  FHR Cat 1.  MVUs ~ 150-11mmHg  Pitocin remains at 64mu/min (max per Dr. Mayford Knife).  Cx 7-8/100/0.  Continue present mgt, optimistic for vaginal delivery.

## 2020-03-02 ENCOUNTER — Encounter (HOSPITAL_COMMUNITY): Payer: Self-pay | Admitting: Family Medicine

## 2020-03-02 DIAGNOSIS — O1414 Severe pre-eclampsia complicating childbirth: Secondary | ICD-10-CM

## 2020-03-02 DIAGNOSIS — O134 Gestational [pregnancy-induced] hypertension without significant proteinuria, complicating childbirth: Secondary | ICD-10-CM

## 2020-03-02 DIAGNOSIS — Z3A37 37 weeks gestation of pregnancy: Secondary | ICD-10-CM

## 2020-03-02 LAB — CBC
HCT: 30.4 % — ABNORMAL LOW (ref 36.0–46.0)
Hemoglobin: 10 g/dL — ABNORMAL LOW (ref 12.0–15.0)
MCH: 29.5 pg (ref 26.0–34.0)
MCHC: 32.9 g/dL (ref 30.0–36.0)
MCV: 89.7 fL (ref 80.0–100.0)
Platelets: 305 10*3/uL (ref 150–400)
RBC: 3.39 MIL/uL — ABNORMAL LOW (ref 3.87–5.11)
RDW: 14.1 % (ref 11.5–15.5)
WBC: 16.6 10*3/uL — ABNORMAL HIGH (ref 4.0–10.5)
nRBC: 0 % (ref 0.0–0.2)

## 2020-03-02 MED ORDER — DIBUCAINE (PERIANAL) 1 % EX OINT: 1.0000 "application " | TOPICAL_OINTMENT | CUTANEOUS | Status: DC | PRN

## 2020-03-02 MED ORDER — COCONUT OIL OIL
1.0000 "application " | TOPICAL_OIL | Status: DC | PRN
  Administered 2020-03-02: 1 via TOPICAL

## 2020-03-02 MED ORDER — NIFEDIPINE ER OSMOTIC RELEASE 30 MG PO TB24
30.0000 mg | ORAL_TABLET | Freq: Every day | ORAL | Status: DC
  Administered 2020-03-02 – 2020-03-04 (×3): 30 mg via ORAL
  Filled 2020-03-02 (×3): qty 1

## 2020-03-02 MED ORDER — DIPHENHYDRAMINE HCL 25 MG PO CAPS: 25.0000 mg | ORAL_CAPSULE | Freq: Four times a day (QID) | ORAL | Status: DC | PRN

## 2020-03-02 MED ORDER — BENZOCAINE-MENTHOL 20-0.5 % EX AERO
1.0000 "application " | INHALATION_SPRAY | CUTANEOUS | Status: DC | PRN
  Administered 2020-03-02: 1 via TOPICAL
  Filled 2020-03-02: qty 56

## 2020-03-02 MED ORDER — WITCH HAZEL-GLYCERIN EX PADS: 1.0000 "application " | MEDICATED_PAD | CUTANEOUS | Status: DC | PRN

## 2020-03-02 MED ORDER — ONDANSETRON HCL 4 MG/2ML IJ SOLN: 4.0000 mg | INTRAMUSCULAR | Status: DC | PRN

## 2020-03-02 MED ORDER — LACTATED RINGERS IV SOLN: INTRAVENOUS | Status: DC

## 2020-03-02 MED ORDER — POTASSIUM CHLORIDE CRYS ER 20 MEQ PO TBCR
20.0000 meq | EXTENDED_RELEASE_TABLET | Freq: Two times a day (BID) | ORAL | Status: DC
  Administered 2020-03-02 – 2020-03-04 (×4): 20 meq via ORAL
  Filled 2020-03-02 (×6): qty 1

## 2020-03-02 MED ORDER — MISOPROSTOL 200 MCG PO TABS
1000.0000 ug | ORAL_TABLET | Freq: Once | ORAL | Status: AC
  Administered 2020-03-02: 1000 ug via RECTAL

## 2020-03-02 MED ORDER — SENNOSIDES-DOCUSATE SODIUM 8.6-50 MG PO TABS
2.0000 | ORAL_TABLET | Freq: Every evening | ORAL | Status: DC | PRN
  Administered 2020-03-02: 2 via ORAL
  Filled 2020-03-02: qty 2

## 2020-03-02 MED ORDER — ONDANSETRON HCL 4 MG PO TABS: 4.0000 mg | ORAL_TABLET | ORAL | Status: DC | PRN

## 2020-03-02 MED ORDER — IBUPROFEN 600 MG PO TABS
600.0000 mg | ORAL_TABLET | Freq: Four times a day (QID) | ORAL | Status: DC
  Administered 2020-03-02 – 2020-03-04 (×9): 600 mg via ORAL
  Filled 2020-03-02 (×9): qty 1

## 2020-03-02 MED ORDER — TETANUS-DIPHTH-ACELL PERTUSSIS 5-2.5-18.5 LF-MCG/0.5 IM SUSP: 0.5000 mL | Freq: Once | INTRAMUSCULAR | Status: DC

## 2020-03-02 MED ORDER — OXYTOCIN-SODIUM CHLORIDE 30-0.9 UT/500ML-% IV SOLN: 2.5000 [IU]/h | INTRAVENOUS | Status: DC | PRN

## 2020-03-02 MED ORDER — MISOPROSTOL 200 MCG PO TABS
ORAL_TABLET | ORAL | Status: AC
  Filled 2020-03-02: qty 5

## 2020-03-02 MED ORDER — OXYCODONE HCL 5 MG PO TABS: 5.0000 mg | ORAL_TABLET | Freq: Four times a day (QID) | ORAL | Status: DC | PRN

## 2020-03-02 MED ORDER — SIMETHICONE 80 MG PO CHEW
80.0000 mg | CHEWABLE_TABLET | ORAL | Status: DC | PRN
  Administered 2020-03-03: 80 mg via ORAL
  Filled 2020-03-02: qty 1

## 2020-03-02 MED ORDER — ACETAMINOPHEN 325 MG PO TABS
650.0000 mg | ORAL_TABLET | ORAL | Status: DC | PRN
  Administered 2020-03-02 – 2020-03-03 (×6): 650 mg via ORAL
  Filled 2020-03-02 (×6): qty 2

## 2020-03-02 MED ORDER — PRENATAL MULTIVITAMIN CH
1.0000 | ORAL_TABLET | Freq: Every day | ORAL | Status: DC
  Administered 2020-03-02 – 2020-03-04 (×3): 1 via ORAL
  Filled 2020-03-02 (×3): qty 1

## 2020-03-02 NOTE — Discharge Summary (Signed)
Postpartum Discharge Summary  Date of Service updated 10/3     Patient Name: Carla Hall DOB: February 23, 1996 MRN: [id number]  Date of admission: 02/29/2020 Delivery date:03/02/2020  Delivering provider: Christin Fudge  Date of discharge: 03/02/2020  Admitting diagnosis: Gestational hypertension [O13.9] Intrauterine pregnancy: [redacted]w[redacted]d    Secondary diagnosis:  Active Problems:   Mild intermittent asthma   Encounter for supervision of normal first pregnancy in second trimester   Obesity in pregnancy   Gestational hypertension, third trimester   Gestational hypertension   Severe pre-eclampsia  Additional problems: none    Discharge diagnosis: Term Pregnancy Delivered and Preeclampsia (severe)                                              Post partum procedures:none Augmentation: AROM, Pitocin, Cytotec and IP Foley Complications: None  Hospital course: Induction of Labor With Vaginal Delivery   24y.o. yo G1P0000 at 26w2das admitted to the hospital 02/29/2020 for induction of labor.  Indication for induction: severe preeclampsia.  Patient had an uncomplicated labor course as follows: Membrane Rupture Time/Date: 3:10 PM ,03/01/2020   Delivery Method:Vaginal, Spontaneous  Episiotomy: None  Lacerations:  2nd degree  Details of delivery can be found in separate delivery note.  Patient had a routine postpartum course. Patient is discharged home 03/02/20.  Newborn Data: Birth date:03/02/2020  Birth time:5:34 AM  Gender:Female  Living status:Living  Apgars:8 ,9  Weight:   Magnesium Sulfate received: Yes: Seizure prophylaxis BMZ received: No Rhophylac:N/A MMR:N/A T-DaP:Given prenatally Flu: N/A Transfusion:No  Physical exam  Vitals:   03/04/20 0433 03/04/20 0856 03/04/20 1121 03/04/20 1530  BP: (!) 144/88 (!) 152/92 (!) 151/92 (!) 148/81  Pulse: 93 96 95 98  Resp: _0 Temp: 98.1 F (36.7 C) 97.8 F (36.6 C) 98.1 F (36.7 C) 98.7 F (37.1 C)  TempSrc:  Oral Oral Oral Oral  SpO2: 100% 100% 100% 100%  Weight:      Height:       General: alert, cooperative and no distress Lochia: appropriate Uterine Fundus: firm DVT Evaluation: No evidence of DVT seen on physical exam. Negative Homan's sign. Labs: Lab Results  Component Value Date   WBC 12.1 (H) 02/29/2020   HGB 10.1 (L) 02/29/2020   HCT 30.5 (L) 02/29/2020   MCV 87.4 02/29/2020   PLT 272 02/29/2020   CMP Latest Ref Rng & Units 02/29/2020  Glucose 70 - 99 mg/dL 88  BUN 6 - 20 mg/dL 5(L)  Creatinine 0.44 - 1.00 mg/dL 0.71  Sodium 135 - 145 mmol/L 137  Potassium 3.5 - 5.1 mmol/L 3.2(L)  Chloride 98 - 111 mmol/L 107  CO2 22 - 32 mmol/L 18(L)  Calcium 8.9 - 10.3 mg/dL 8.9  Total Protein 6.5 - 8.1 g/dL 6.3(L)  Total Bilirubin 0.3 - 1.2 mg/dL 0.5  Alkaline Phos 38 - 126 U/L 121  AST 15 - 41 U/L 18  ALT 0 - 44 U/L 15   Edinburgh Score: No flowsheet data found.   After visit meds:  Allergies as of 03/04/2020      Reactions   Penicillins Rash      Medication List    STOP taking these medications   aspirin 81 MG chewable tablet   PRENATAL VITAMIN PO     TAKE these medications   albuterol 108 (90 Base)  MCG/ACT inhaler Commonly known as: VENTOLIN HFA Inhale 2 puffs into the lungs every 6 (six) hours as needed for wheezing or shortness of breath.   benzocaine-Menthol 20-0.5 % Aero Commonly known as: DERMOPLAST Apply 1 application topically as needed for irritation (perineal discomfort).   famotidine 20 MG tablet Commonly known as: Pepcid Take 1 tablet (20 mg total) by mouth 2 (two) times daily.   ibuprofen 600 MG tablet Commonly known as: ADVIL Take 1 tablet (600 mg total) by mouth every 6 (six) hours.   lisinopril 10 MG tablet Commonly known as: ZESTRIL Take 1 tablet (10 mg total) by mouth daily. Start taking on: March 05, 2020   NIFEdipine 30 MG 24 hr tablet Commonly known as: ADALAT CC Take 1 tablet (30 mg total) by mouth at bedtime.   simethicone 80  MG chewable tablet Commonly known as: MYLICON Chew 1 tablet (80 mg total) by mouth as needed for flatulence.        Discharge home in stable condition Infant Feeding: Bottle and Breast Infant Disposition:home with mother Discharge instruction: per After Visit Summary and Postpartum booklet. Activity: Advance as tolerated. Pelvic rest for 6 weeks.  Diet: routine diet Future Appointments: Future Appointments  Date Time Provider [place]  03/30/2020  9:10 AM [place], [place] [facility]   Follow up Visit:   Please schedule this patient for a Virtual postpartum visit in 4 weeks with the following provider: Any provider. Additional Postpartum F/U:BP check 1 week  Low risk pregnancy complicated by: HTN Delivery mode:  Vaginal, Spontaneous  Anticipated Birth Control:  Condoms   03/04/2020 Cherre Blanc, MD

## 2020-03-02 NOTE — Anesthesia Postprocedure Evaluation (Signed)
Anesthesia Post Note  Patient: Carla Hall  Procedure(s) Performed: AN AD HOC LABOR EPIDURAL     Patient location during evaluation: Mother Baby Anesthesia Type: Epidural Level of consciousness: awake and alert Pain management: pain level controlled Vital Signs Assessment: post-procedure vital signs reviewed and stable Respiratory status: spontaneous breathing, nonlabored ventilation and respiratory function stable Cardiovascular status: stable Postop Assessment: no headache, no backache and epidural receding Anesthetic complications: no   No complications documented.  Last Vitals:  Vitals:   03/02/20 1600 03/02/20 1700  BP:    Pulse:    Resp: 18 18  Temp:    SpO2:      Last Pain:  Vitals:   03/02/20 1539  TempSrc: Oral  PainSc:    Pain Goal: Patients Stated Pain Goal: 3 (03/01/20 1630)                 [facility] Records

## 2020-03-02 NOTE — Lactation Note (Signed)
This note was copied from a baby's chart. Lactation Consultation Note  Patient Name: Carla Hall Date: 03/02/2020 Reason for consult: Initial assessment;Primapara;1st time breastfeeding;Early term 37-38.6wks  1308 - 1335 - I conducted an initial consult with [name], a P1 who is currently receiving magnesium therapy. She was holding baby upon entry. She reports that he rooted during the "golden hour" after delivery, but due to procedures (post-op), he did not initiate breast feeding. He has been sleepy since that time.  She has a used Medela breast pump with new pump equipment. I made her aware of our DEBP option while at the hospital.   She has limited breast feeding knowledge; some prenatal education provided.  I conducted basic breast feeding education including hand expression (colostrum noted), feeding cues, feeding frequency and duration, differences between colostrum and mature milk, and benefits of breast feeding.  Baby roused after the NP examined him. We attempted to latch him, but he was too sleepy. I showed parents how to position him in cross cradle and then in football hold. I also helped [name] hand express colostrum into his mouth. He was too sleepy to latch even with gently pestering.  I recommended that they feed on demand 8-13 times a day and wake baby if needed. I recommended that they try to wake him again in an hour.  I made parents aware of lactation support services this evening and invited them to call for assistance as needed.  All questions answered at this time.   Maternal Data Has patient been taught Hand Expression?: Yes Does the patient have breastfeeding experience prior to this delivery?: No  Feeding    LATCH Score Latch: Too sleepy or reluctant, no latch achieved, no sucking elicited.  Audible Swallowing: None  Type of Nipple: Everted at rest and after stimulation  Comfort (Breast/Nipple): Soft / non-tender  Hold  (Positioning): Assistance needed to correctly position infant at breast and maintain latch.  LATCH Score: 5  Interventions Interventions: Breast feeding basics reviewed;Skin to skin;Assisted with latch;Hand express;Support pillows;Adjust position  Lactation Tools Discussed/Used     Consult Status Consult Status: Follow-up Date: 03/03/20 Follow-up type: In-patient    Walker Shadow 03/02/2020, 1:41 PM

## 2020-03-03 NOTE — Progress Notes (Addendum)
Daily Antepartum Note  Admission Date: 02/29/2020 Current Date: 03/03/2020 7:48 AM  Carla Hall is a 24 y.o. G1P1001, PPD#1 SVD/2nd, admitted for IOL for gHTN and severe pre-eclampsia dx (BPs, PCR) in labor.  Pregnancy complicated by: Patient Active Problem List   Diagnosis Date Noted  . Severe pre-eclampsia 03/01/2020  . Gestational hypertension 02/29/2020  . Gestational hypertension, third trimester 01/17/2020  . Encounter for supervision of normal first pregnancy in second trimester 08/26/2019  . Obesity in pregnancy 08/26/2019  . Mild intermittent asthma 04/08/2018    Overnight/24hr events:  Just came off Mg  Subjective:  No s/s of pre-eclampsia, Mg toxicity  Objective:    Current Vital Signs 24h Vital Sign Ranges  T 97.6 F (36.4 C) Temp  Avg: 98.4 F (36.9 C)  Min: 97.6 F (36.4 C)  Max: 99.2 F (37.3 C)  BP 140/86 BP  Min: 136/69  Max: 152/90  HR 79 Pulse  Avg: 85.9  Min: 76  Max: 109  RR 18 Resp  Avg: 17.8  Min: 16  Max: 20  SaO2 100 % Room Air SpO2  Avg: 98.5 %  Min: 97 %  Max: 100 %       24 Hour I/O Current Shift I/O  Time Ins Outs 10/01 0701 - 10/02 0700 In: 3478.1 [P.O.:1090; I.V.:2388.1] Out: 3850 [Urine:3850] No intake/output data recorded.   Patient Vitals for the past 24 hrs:  BP Temp Temp src Pulse Resp SpO2  03/03/20 0737 140/86 97.6 F (36.4 C) Oral 79 18 100 %  03/03/20 0405 (!) 143/87 -- -- 81 18 99 %  03/03/20 0300 -- -- -- -- 16 --  03/03/20 0200 -- -- -- -- 16 --  03/03/20 0100 -- -- -- -- 18 --  03/03/20 0005 140/67 98.4 F (36.9 C) Oral 76 16 --  03/02/20 2309 136/69 98 F (36.7 C) Oral 76 18 98 %  03/02/20 2300 -- -- -- -- 16 --  03/02/20 2200 -- -- -- -- 18 --  03/02/20 2100 -- -- -- -- 18 --  03/02/20 2000 -- -- -- -- 20 --  03/02/20 1924 -- 98.4 F (36.9 C) Oral -- 18 97 %  03/02/20 1900 -- -- -- -- 18 --  03/02/20 1800 -- -- -- -- 18 --  03/02/20 1700 -- -- -- -- 18 --  03/02/20 1600 -- -- -- -- 18 --  03/02/20 1539  (!) 150/90 98.2 F (36.8 C) Oral 82 18 99 %  03/02/20 1500 -- -- -- -- 18 --  03/02/20 1400 -- -- -- -- 18 --  03/02/20 1300 -- -- -- -- 18 --  03/02/20 1200 -- -- -- -- 18 --  03/02/20 1109 (!) 145/87 98.2 F (36.8 C) Oral (!) 109 18 98 %  03/02/20 1000 -- -- -- -- 18 --  03/02/20 0918 (!) 152/90 -- -- 98 18 --  03/02/20 0900 -- -- -- -- 18 --  03/02/20 0817 (!) 147/87 99.2 F (37.3 C) Oral -- 18 --  03/02/20 0800 (!) 150/92 99.2 F (37.3 C) Oral -- 18 --    Physical exam: General: Well nourished, well developed female in no acute distress. Abdomen: nttp Respiratory: No respiratory distress Extremities: no clubbing, cyanosis or edema Skin: Warm and dry.   Medications: Current Facility-Administered Medications  Medication Dose Route Frequency Provider Last Rate Last Admin  . acetaminophen (TYLENOL) tablet 650 mg  650 mg Oral Q4H PRN [place] Bing, MD   650 mg at  03/02/20 2018  . benzocaine-Menthol (DERMOPLAST) 20-0.5 % topical spray 1 application  1 application Topical PRN [place] Bing, MD   1 application at 03/02/20 1033  . coconut oil  1 application Topical PRN [place] Bing, MD   1 application at 03/02/20 1602  . witch hazel-glycerin (TUCKS) pad 1 application  1 application Topical PRN [place] Bing, MD       And  . dibucaine (NUPERCAINAL) 1 % rectal ointment 1 application  1 application Rectal PRN [place] Bing, MD      . diphenhydrAMINE (BENADRYL) capsule 25 mg  25 mg Oral Q6H PRN [place] Bing, MD      . labetalol (NORMODYNE) injection 20 mg  20 mg Intravenous PRN [place] Bing, MD   20 mg at 03/02/20 0605   And  . labetalol (NORMODYNE) injection 40 mg  40 mg Intravenous PRN [place] Bing, MD   40 mg at 03/01/20 0603   And  . labetalol (NORMODYNE) injection 80 mg  80 mg Intravenous PRN [place] Bing, MD       And  . hydrALAZINE (APRESOLINE) injection 10 mg  10 mg Intravenous PRN [place] Bing, MD      . ibuprofen (ADVIL) tablet 600 mg  600  mg Oral Q6H [place] Bing, MD   600 mg at 03/03/20 0536  . NIFEdipine (PROCARDIA) capsule 10 mg  10 mg Oral PRN [place] Bing, MD       And  . NIFEdipine (PROCARDIA) capsule 20 mg  20 mg Oral PRN [place] Bing, MD       And  . NIFEdipine (PROCARDIA) capsule 20 mg  20 mg Oral PRN [place] Bing, MD       And  . labetalol (NORMODYNE) injection 40 mg  40 mg Intravenous PRN [place] Bing, MD   40 mg at 03/02/20 0620  . lactated ringers infusion   Intravenous Continuous [place] Bing, MD   Stopped at 03/03/20 [id number]  . NIFEdipine (PROCARDIA-XL/NIFEDICAL-XL) 24 hr tablet 30 mg  30 mg Oral QHS [place] Bing, MD   30 mg at 03/02/20 2007  . ondansetron (ZOFRAN) tablet 4 mg  4 mg Oral Q4H PRN [place] Bing, MD       Or  . ondansetron (ZOFRAN) injection 4 mg  4 mg Intravenous Q4H PRN [place] Bing, MD      . oxyCODONE (Oxy IR/ROXICODONE) immediate release tablet 5 mg  5 mg Oral Q6H PRN [place] Bing, MD      . oxytocin (PITOCIN) IV infusion 30 units in NS 500 mL - Premix  2.5 Units/hr Intravenous Continuous PRN [place] Bing, MD      . potassium chloride SA (KLOR-CON) CR tablet 20 mEq  20 mEq Oral BID [place] Bing, MD   20 mEq at 03/02/20 2255  . prenatal multivitamin tablet 1 tablet  1 tablet Oral Q1200 [place] Bing, MD   1 tablet at 03/02/20 1033  . senna-docusate (Senokot-S) tablet 2 tablet  2 tablet Oral QHS PRN [place] Bing, MD   2 tablet at 03/02/20 2255  . simethicone (MYLICON) chewable tablet 80 mg  80 mg Oral PRN [place] Bing, MD      . Tdap Leda Min) injection 0.5 mL  0.5 mL Intramuscular Once [place] Bing, MD        Labs:  Recent Labs  Lab 02/29/20 0808 02/29/20 2104 03/02/20 0730  WBC 11.9* 12.1* 16.6*  HGB 10.1* 10.1* 10.0*  HCT 31.1* 30.5* 30.4*  PLT 269 272 305    Recent Labs  Lab 02/29/20  0808  NA 137  K 3.2*  CL 107  CO2 18*  BUN 5*  CREATININE 0.71  CALCIUM 8.9  PROT 6.3*  BILITOT 0.5  ALKPHOS 121  ALT 15  AST  18  GLUCOSE 88     Radiology:  No new imaging  Assessment & Plan:  Pt doing well *PP: A POS. Breast. rpr neg. Rubella immune. Desires circ, d/w mom and dad, and will try and do today. Ask about [facility] tomorrow *Severe pre-eclampsia: continue procardia *Dispo: likely home tomorrow. Request sent for 1wk bp check.   Cornelia Copa MD Attending Center for [facility] (Faculty Practice) GYN Consult Phone: [phone] (M-F, 0800-1700) & [phone] (Off hours, weekends, holidays)

## 2020-03-03 NOTE — Lactation Note (Signed)
This note was copied from a baby's chart. Lactation Consultation Note  Patient Name: Carla Hall [id number] Date: 03/03/2020 Reason for consult: Follow-up assessment;Primapara  Follow up with 38 hours old infant with 5.32% weight loss of a P1 mother. Mother reports she is taking a break from breastfeeding at the moment and supplementing with formula. Parents state their main goal is to exclusively breastfeed this baby but they will use formula as needed for now. Discussed supplementation guidelines, stomach size and paced bottle feeding. Encouraged frequent burping and sit up position. Encouraged to contact lactation services as needed for any questions or concerns.    Maternal Data Formula Feeding for Exclusion: No Has patient been taught Hand Expression?: Yes Does the patient have breastfeeding experience prior to this delivery?: No  Feeding Feeding Type: Bottle Fed - Formula Nipple Type: Slow - flow  Interventions Interventions: Breast feeding basics reviewed;DEBP  Lactation Tools Discussed/Used Tools: Pump Breast pump type: Double-Electric Breast Pump Pump Review: Milk Storage;Setup, frequency, and cleaning   Consult Status Consult Status: Follow-up Date: 03/04/20 Follow-up type: In-patient    Carla Hall 03/03/2020, 8:19 PM

## 2020-03-03 NOTE — Lactation Note (Signed)
This note was copied from a baby's chart. Lactation Consultation Note  Patient Name: Boy Nevaeha Finerty QMVHQ'[id number] Date: 03/03/2020 Reason for consult: Follow-up assessment;Primapara;1st time breastfeeding;Early term 37-38.6wks;Difficult latch;Mother's request  1134 - 1203 - I followed up with Ms. Yetta Barre upon RN and maternal request. Baby has had limited feedings in the first 24 hours and is now 76 hours old. Patient is unsure if she should begin supplementation.  I offered to assist with feeding baby and she consented. I undressed baby down to a dry diaper and helped wake him up. I instructed Ms. Billard to hand express.  We placed baby first in cross cradle hold. He latched readily with rhythmic suckling sequences. I noted that he needed support at the breast to maintain latch. Anytime I released my hand (sandwiching the breast), he would release. I helped him feed for about 10-15 minutes on this side.   We then moved him to the football hold on the left breast. I showed her support person how to help with making a breast sandwich. Parents were able to repeat back latched baby independently multiple times.  We discussed the possibility of implementing a NS tonight if baby continues to release hold of the breast. However, at this time, baby has shown great progress, and Ms. Underberg is showing more confidence in her hold. I suggested that they continue to practice today and then determine tonight if they need a NS.   In the meantime we agreed that supplementation may be warranted given inconsistency with feedings at this time. I counseled them on their supplementation options.   PLAN: Breast feed on demand 8-12 times a day. Continue to work on positioning. Breast feed FIRST and supplement after. Follow guidelines on supplementation volumes by age. Pump today particularly if having difficult with latch. Feed any EBM back to baby. Call lactation for follow up as needed. Use of a nipple shield may be  helpful to help baby maintain latch, but baby is able to latch without one.   Maternal Data Has patient been taught Hand Expression?: Yes Does the patient have breastfeeding experience prior to this delivery?: No  Feeding Feeding Type: Bottle Fed - Formula Nipple Type: Slow - flow  LATCH Score Latch: Repeated attempts needed to sustain latch, nipple held in mouth throughout feeding, stimulation needed to elicit sucking reflex.  Audible Swallowing: A few with stimulation  Type of Nipple: Everted at rest and after stimulation  Comfort (Breast/Nipple): Soft / non-tender  Hold (Positioning): Assistance needed to correctly position infant at breast and maintain latch.  LATCH Score: 7  Interventions Interventions: Breast feeding basics reviewed;Assisted with latch;Skin to skin;Breast massage;Hand express;Breast compression;Adjust position;Support pillows;Position options;DEBP  Lactation Tools Discussed/Used Pump Review: Setup, frequency, and cleaning   Consult Status Consult Status: Follow-up Date: 03/04/20 Follow-up type: In-patient    Walker Shadow 03/03/2020, 1:12 PM

## 2020-03-04 DIAGNOSIS — Z9889 Other specified postprocedural states: Secondary | ICD-10-CM | POA: Diagnosis not present

## 2020-03-04 MED ORDER — LISINOPRIL 10 MG PO TABS
10.0000 mg | ORAL_TABLET | Freq: Every day | ORAL | 1 refills | Status: DC
Start: 2020-03-05 — End: 2020-03-05

## 2020-03-04 MED ORDER — SIMETHICONE 80 MG PO CHEW: 80.0000 mg | CHEWABLE_TABLET | ORAL | 0 refills | Status: DC | PRN

## 2020-03-04 MED ORDER — NIFEDIPINE ER 30 MG PO TB24: 30.0000 mg | ORAL_TABLET | Freq: Every day | ORAL | 1 refills | Status: DC

## 2020-03-04 MED ORDER — IBUPROFEN 600 MG PO TABS
600.0000 mg | ORAL_TABLET | Freq: Four times a day (QID) | ORAL | 0 refills | Status: DC
Start: 2020-03-04 — End: 2020-04-13

## 2020-03-04 MED ORDER — LISINOPRIL 10 MG PO TABS
10.0000 mg | ORAL_TABLET | Freq: Every day | ORAL | Status: DC
  Administered 2020-03-04: 10 mg via ORAL
  Filled 2020-03-04: qty 1

## 2020-03-04 MED ORDER — BENZOCAINE-MENTHOL 20-0.5 % EX AERO: 1.0000 "application " | INHALATION_SPRAY | CUTANEOUS | 0 refills | Status: DC | PRN

## 2020-03-04 NOTE — Progress Notes (Signed)
POSTPARTUM PROGRESS NOTE  Post Partum Day 2  Subjective:  Carla Hall is a 24 y.o. G1P1001 s/p vaginal delivery at [redacted]w[redacted]d.  She reports she is doing well. No acute events overnight. She denies any problems with ambulating, voiding or po intake. Denies nausea or vomiting.  Pain is well controlled.  Lochia is decreasing and light.  Objective: Blood pressure (!) 144/88, pulse 93, temperature 98.1 F (36.7 C), temperature source Oral, resp. rate 18, height 5\' 9"  (1.753 m), weight 114.9 kg, last menstrual period 06/15/2019, SpO2 100 %, unknown if currently breastfeeding.  Physical Exam:  General: alert, cooperative and no distress Chest: no respiratory distress Heart:regular rate, distal pulses intact Abdomen: soft, nontender,  Uterine Fundus: firm, appropriately tender DVT Evaluation: No calf swelling or tenderness Extremities: 2+ edema Skin: warm, dry  Recent Labs    03/02/20 0730  HGB 10.0*  HCT 30.4*    Assessment/Plan: JAMITA MCKELVIN is a 24 y.o. G1P1001 s/p vaginal at [redacted]w[redacted]d   PPD#2 - Doing well  Routine postpartum care Lisinopril added this AM, if blood pressure is improved possible PM discharge  LOS: 4 days   [redacted]w[redacted]d, MD Faculty attending 03/04/2020, 8:21 AM

## 2020-03-04 NOTE — Lactation Note (Signed)
This note was copied from a baby's chart. Lactation Consultation Note  Patient Name: Boy Raylea Adcox [id number] Date: 03/04/2020 Reason for consult: Follow-up assessment;Early term 26-38.6wks   Mom and dad bottle feeding formula when LC entered room.  Mom did not have any questions regarding pumping or BF.  She is having her BF checked this afternoon and hoping to go home.  She is taking a break from BF today and hopes to better regulate her blood pressure.   Mom is aware of support groups, phone line and OP LC services.  Mom was encouraged to pump when supplementing infant in order to continue to stimulate milk supply and feed back EBM to infant.  Maternal Data    Feeding    LATCH Score                   Interventions Interventions: Breast feeding basics reviewed  Lactation Tools Discussed/Used     Consult Status Consult Status: Complete Date: 03/04/20 Follow-up type: In-patient    Maryruth Hancock [facility] 03/04/2020, 12:56 PM

## 2020-03-04 NOTE — Progress Notes (Signed)
Discharge instructions given to patient. Reviewed signs and symptoms of hypertension, medication changes, follow up appointment, and postpartum care. Nightime Procardia given per MD.

## 2020-03-05 ENCOUNTER — Telehealth: Payer: Self-pay | Admitting: *Deleted

## 2020-03-05 ENCOUNTER — Other Ambulatory Visit: Payer: Self-pay | Admitting: *Deleted

## 2020-03-05 MED ORDER — LISINOPRIL 10 MG PO TABS
10.0000 mg | ORAL_TABLET | Freq: Every day | ORAL | 1 refills | Status: DC
Start: 2020-03-05 — End: 2021-06-28

## 2020-03-05 MED ORDER — SIMETHICONE 80 MG PO CHEW: 80.0000 mg | CHEWABLE_TABLET | ORAL | 0 refills | Status: DC | PRN

## 2020-03-05 NOTE — Telephone Encounter (Signed)
Pt called office staing that [facility] says [facility] don't have [facility] for Mylicon or Lisinopril.  It was sent in by Dr Mayford Knife.  I resent the meds to [facility].

## 2020-03-07 ENCOUNTER — Ambulatory Visit (INDEPENDENT_AMBULATORY_CARE_PROVIDER_SITE_OTHER): Payer: PRIVATE HEALTH INSURANCE | Admitting: Women's Health

## 2020-03-07 ENCOUNTER — Other Ambulatory Visit: Payer: Self-pay

## 2020-03-07 ENCOUNTER — Encounter: Payer: Self-pay | Admitting: Women's Health

## 2020-03-07 VITALS — BP 141/98 | HR 90 | Resp 16 | Ht 69.0 in | Wt 238.0 lb

## 2020-03-07 DIAGNOSIS — Z0130 Encounter for examination of blood pressure without abnormal findings: Secondary | ICD-10-CM

## 2020-03-07 NOTE — Patient Instructions (Signed)
-Reviewed warning blood pressure values (systolic = / > 140 and/or diastolic =/> 90). Explained that, if blood pressure is elevated, she should sit down, rest, and eat/drink something. If still elevated 15 minutes later, she should call clinic or come to MAU. She should come to MAU if she has elevated pressures and any of the following:  - headache not relieved with tylenol, rest, hydration -blurry vision, floating spots in her vision - sudden full-body edema or facial edema -RUQ pain that is constant.   These symptoms may indicate that her blood pressure is worsening and she may be developing gestational hypertension or pre-eclampsia, which is an emergency.       Nifedipine Extended-Release Oral Tablets What is this medicine? NIFEDIPINE (nye FED i peen) is a calcium channel blocker. It relaxes your blood vessels and decreases the amount of work the heart has to do. It treats high blood pressure and/or prevents chest pain (also called angina). This medicine may be used for other purposes; ask your health care provider or pharmacist if you have questions. COMMON BRAND NAME(S): Adalat CC, Afeditab CR, Nifediac CC, Nifedical XL, Procardia XL What should I tell my health care provider before I take this medicine? They need to know if you have any of these conditions:  blockage in your bowels  constipation  heart attack  heart disease  heart failure  liver disease  low blood pressure  an unusual or allergic reaction to nifedipine, other drugs, foods, dyes or preservatives  pregnant or trying to get pregnant  breast-feeding How should I use this medicine? Take this drug by mouth. Take it as directed on the prescription label at the same time every day. Do not cut, crush or chew this drug. Swallow the tablets whole. Some tablets need to be taken on an empty stomach. Ask your pharmacist or health care provider if you have any questions. Keep taking it unless your health care provider  tells you to stop. Do not take this drug with grapefruit juice. Talk to your health care provider about the use of this drug in children. Special care may be needed. Overdosage: If you think you have taken too much of this medicine contact a poison control center or emergency room at once. NOTE: This medicine is only for you. Do not share this medicine with others. What if I miss a dose? If you miss a dose, take it as soon as you can. If it is almost time for your next dose, take only that dose. Do not take double or extra doses. What may interact with this medicine? Do not take this medicine with any of the following medications:  certain medicines for seizures like carbamazepine, phenobarbital, phenytoin  lumacaftor; ivacaftor  rifabutin  rifampin  rifapentine  St. John's Wort This medicine may also interact with the following medications:  antiviral medicines for HIV or AIDS  certain medicines for blood pressure  certain medicines for diabetes  certain medicines for erectile dysfunction  certain medicines for fungal infections like ketoconazole, fluconazole, and itraconazole  certain medicines for irregular heart beat like flecainide and quinidine  certain medicines that treat or prevent blood clots like warfarin  clarithromycin  digoxin  dolasetron  erythromycin  fluoxetine  grapefruit juice  local or general anesthetics  nefazodone  orlistat  quinupristin; dalfopristin  sirolimus  stomach acid blockers like cimetidine, ranitidine, omeprazole, or pantoprazole  tacrolimus  valproic acid This list may not describe all possible interactions. Give your health care provider a list  of all the medicines, herbs, non-prescription drugs, or dietary supplements you use. Also tell them if you smoke, drink alcohol, or use illegal drugs. Some items may interact with your medicine. What should I watch for while using this medicine? Visit your health care  provider for regular checks on your progress. Check your blood pressure as directed. Ask your health care provider what your blood pressure should be. Also, find out when you should contact him or her. Do not treat yourself for coughs, colds, or pain while you are using this drug without asking your health care provider for advice. Some drugs may increase your blood pressure. You may get drowsy or dizzy. Do not drive, use machinery, or do anything that needs mental alertness until you know how this drug affects you. Do not stand up or sit up quickly, especially if you are an older patient. This reduces the risk of dizzy or fainting spells. The tablet shell for some brands of this drug does not dissolve. This is normal. The tablet shell may appear whole in the stool. This is not a cause for concern. What side effects may I notice from receiving this medicine? Side effects that you should report to your doctor or health care provider as soon as possible:  allergic reactions (skin rash, itching or hives; swelling of the face, lips, or tongue)  heart attack (trouble breathing; pain or tightness in the chest, neck, back or arms; unusually weak or tired)  heart failure (trouble breathing; fast, irregular heartbeat; sudden weight gain; swelling of the ankles, feet, hands; unusually weak or tired)  low blood pressure (dizziness; feeling faint or lightheaded, falls; unusually weak or tired) Side effects that usually do not require medical attention (report to your doctor or health care provider if they continue or are bothersome):  bloating  changes in emotions or moods  constipation  facial flushing  headache  nasal congestion (like runny or stuffy nose)  nausea  stomach pain This list may not describe all possible side effects. Call your doctor for medical advice about side effects. You may report side effects to FDA at 1-800-FDA-1088. Where should I keep my medicine? Keep out of the reach  of children and pets. Store at room temperature between 20 and 25 degrees C (68 and 77 degrees F). Protect from light and moisture. Keep the container tightly closed. Throw away any unused drug after the expiration date. NOTE: This sheet is a summary. It may not cover all possible information. If you have questions about this medicine, talk to your doctor, pharmacist, or health care provider.  2020 Elsevier/Gold Standard (2019-03-15 08:24:11)

## 2020-03-07 NOTE — Progress Notes (Signed)
  History:  [name] is a 24 y.o. G1P1001 who presents to clinic today for BP check. Patient was delivered at [redacted]w[redacted]d for severe pre-eclampsia and received magnesium at the hospital. Patient reports taking Lisinopril 10mg  daily in the morning and Nifedipine 30mg  XL at bedtime. Patient denies any s/sx today including headache, visual change, abdominal pain or other concerns.  The following portions of the patient's history were reviewed and updated as appropriate: allergies, current medications, family history, past medical history, social history, past surgical history and problem list.  Review of Systems:  Review of Systems  Constitutional: Negative for chills and fever.  Eyes: Negative for blurred vision.  Respiratory: Negative for shortness of breath.   Cardiovascular: Negative for chest pain.  Gastrointestinal: Negative for abdominal pain, nausea and vomiting.  Neurological: Negative for headaches.     Objective:  Physical Exam BP (!) 141/98   Pulse 90   Resp 16   Ht 5\' 9"  (1.753 m)   Wt 238 lb (108 kg)   LMP 06/15/2019   Breastfeeding Yes   BMI 35.15 kg/m    Physical Exam Constitutional:      General: She is not in acute distress.    Appearance: Normal appearance. She is not ill-appearing, toxic-appearing or diaphoretic.  HENT:     Head: Normocephalic and atraumatic.  Pulmonary:     Effort: Pulmonary effort is normal.  Neurological:     Mental Status: She is alert and oriented to person, place, and time.  Psychiatric:        Mood and Affect: Mood normal.        Behavior: Behavior normal.        Thought Content: Thought content normal.        Judgment: Judgment normal.    Labs and Imaging No results found for this or any previous visit (from the past 24 hour(s)).  No results found.   Assessment & Plan:  1. Blood pressure check - initial BP 141/98 - repeat BP 130/90 - asymptomatic - consulted with Dr. who recommends to add 30mg  Procardia XL in  morning as well continue current dosing - recheck BP one week - discussed s/sx of low BP and when to call office  2. Postpartum state  Approximately 10 minutes of total time was spent with this patient on counseling and coordination of care.  Trevaughn Schear, , NP 03/07/2020 1:25 PM

## 2020-03-09 ENCOUNTER — Encounter: Payer: PRIVATE HEALTH INSURANCE | Admitting: Certified Nurse Midwife

## 2020-03-13 ENCOUNTER — Other Ambulatory Visit: Payer: Self-pay

## 2020-03-13 ENCOUNTER — Encounter: Payer: Self-pay | Admitting: Advanced Practice Midwife

## 2020-03-13 ENCOUNTER — Ambulatory Visit (INDEPENDENT_AMBULATORY_CARE_PROVIDER_SITE_OTHER): Payer: PRIVATE HEALTH INSURANCE | Admitting: Advanced Practice Midwife

## 2020-03-13 VITALS — BP 120/78 | HR 84 | Resp 16 | Ht 70.0 in | Wt 230.0 lb

## 2020-03-13 DIAGNOSIS — Z0130 Encounter for examination of blood pressure without abnormal findings: Secondary | ICD-10-CM

## 2020-03-13 NOTE — Progress Notes (Signed)
G1P1001 who is Day 20 following SVD, with IOL for GHTN developing severe PEC with h/a intrapartum, is here at [facility] for BP check. She denies any h/a, visual disturbances, or epigastric pain.  She is overall doing well, reports swelling is gone and she has minimal discomfort with her perineal sutures.  She is taking Lisinopril 10 mg and Procardia XL 30 mg Q AM and Procardia 30 mg QHS as prescribed. BPs at home have been 130s-140s over 80s-90s.   BP today is 120/78  VS reviewed, nursing note reviewed,  Constitutional: well developed, well nourished, no distress HEENT: normocephalic CV: normal rate Pulm/chest wall: normal effort Abdomen: soft Neuro: alert and oriented x 3 Skin: warm, dry Psych: affect normal  Follow up in 2 weeks for virtual PP visit.    Sharen Counter, CNM 12:01 PM

## 2020-03-27 ENCOUNTER — Other Ambulatory Visit: Payer: Self-pay | Admitting: Obstetrics & Gynecology

## 2020-03-30 ENCOUNTER — Telehealth (INDEPENDENT_AMBULATORY_CARE_PROVIDER_SITE_OTHER): Payer: PRIVATE HEALTH INSURANCE

## 2020-03-30 DIAGNOSIS — Z8759 Personal history of other complications of pregnancy, childbirth and the puerperium: Secondary | ICD-10-CM

## 2020-03-30 NOTE — Progress Notes (Signed)
I connected with Carla Hall on 03/30/20 at  9:10 AM EDT by: Mychart video and verified that I am speaking with the correct person using two identifiers.  Patient is located at home and provider is located at [facility] [employer] [facility] [employer] at [place] .     The purpose of this virtual visit is to provide medical care while limiting exposure to the novel coronavirus. I discussed the limitations, risks, security and privacy concerns of performing an evaluation and management service by MyChart and the availability of in person appointments. I also discussed with the patient that there may be a patient responsible charge related to this service. By engaging in this virtual visit, you consent to the provision of healthcare.  Additionally, you authorize for your insurance to be billed for the services provided during this visit.  The patient expressed understanding and agreed to proceed.  The following staff members participated in the virtual visit:  Deanna Solo and Cleone Slim CNM  Post Partum Visit Note Subjective:   Carla Hall is a 24 y.o. G48P1001 female being evaluated for postpartum followup.  She is 4 weeks postpartum following a normal spontaneous vaginal delivery at  37.2 gestational weeks.  I have fully reviewed the prenatal and intrapartum course; pregnancy complicated by gestational hypertension.  Postpartum course has been unremarkable. Baby is doing well. Baby is feeding by breast. Bleeding no bleeding. Bowel function is normal. Bladder function is normal. Patient is not sexually active. Contraception method is condoms. Postpartum depression screening: negative.    The pregnancy intention screening data noted above was reviewed. Potential methods of contraception were discussed. The patient elected to proceed with Female Condom.   The following portions of the patient's history were reviewed and updated as appropriate: allergies, current medications, past family history,  past medical history, past social history, past surgical history and problem list.  Review of Systems Pertinent items are noted in HPI.   Objective:   Self-Obtained BP 127/86       Assessment:    Normal postpartum exam. Normal pap 2019  Plan:  Essential components of care per ACOG recommendations:  1.  Hall and well being: Patient with negative depression screening today. Reviewed local resources for support.  - Patient does not use tobacco.  - hx of drug use? No    2. Infant care and feeding:  -Patient currently breastmilk feeding? Yes Patient is exclusively pumping and supplementing with formula. Discussed pumping frequently to establish supply  -Social determinants of health (SDOH) reviewed in EPIC. No concerns  3. Sexuality, contraception and birth spacing - Patient does not want a pregnancy in the next year.   - Reviewed forms of contraception in tiered fashion. Patient desired condoms today.   - Discussed birth spacing of 18 months  4. Sleep and fatigue -Encouraged family/partner/community support of 4 hrs of uninterrupted sleep to help with Hall and fatigue  5. Physical Recovery  - Discussed patients delivery and complications - Patient had a 2nd degree laceration, perineal healing reviewed. Patient expressed understanding - Patient has urinary incontinence? No  - Patient is safe to resume physical and sexual activity at 6 weeks post delivery  6.  Health Maintenance - Last pap smear done 2019 and was normal with negative HPV.  7. BPs still well controlled on medication. Patient desires to not be on medication unless necessary and feels like BPs are settling back down to normal. Will have patient return for in person at 6 weeks  for BP check and medication management as needed. Encouraged patient to make appointment with PCP for long term management of BP meds if needed.   20 minutes of non-face-to-face time spent with the patient    Rolm Bookbinder, CNM [facility] [employer], [facility]

## 2020-04-13 ENCOUNTER — Encounter: Payer: Self-pay | Admitting: Certified Nurse Midwife

## 2020-04-13 ENCOUNTER — Other Ambulatory Visit: Payer: Self-pay

## 2020-04-13 ENCOUNTER — Ambulatory Visit (INDEPENDENT_AMBULATORY_CARE_PROVIDER_SITE_OTHER): Payer: PRIVATE HEALTH INSURANCE | Admitting: Certified Nurse Midwife

## 2020-04-13 VITALS — BP 116/75 | HR 80 | Resp 16 | Ht 69.0 in | Wt 231.0 lb

## 2020-04-13 DIAGNOSIS — O1495 Unspecified pre-eclampsia, complicating the puerperium: Secondary | ICD-10-CM

## 2020-04-13 DIAGNOSIS — O1494 Unspecified pre-eclampsia, complicating childbirth: Secondary | ICD-10-CM

## 2020-04-13 NOTE — Progress Notes (Signed)
     GYNECOLOGY OFFICE VISIT NOTE  History:  24 y.o. G1P1001 here today for BP check. She is 6 weeks postpartum and her pregnancy was complicated by severe preeclampsia. She is taking Procardia 30 mg and Lisinopril 10 mg daily. She feels well. Her home BPs have been normal per pt.  Past Medical History:  Diagnosis Date  . H/O tympanostomy   . History of COVID-19   . Mild intermittent asthma 04/08/2018    Past Surgical History:  Procedure Laterality Date  . TYMPANOSTOMY TUBE PLACEMENT    . WISDOM TOOTH EXTRACTION      The following portions of the patient's history were reviewed and updated as appropriate: allergies, current medications, past family history, past medical history, past social history, past surgical history and problem list.   Review of Systems:  Negative except noted in HPI  Objective:  Physical Exam BP 116/75   Pulse 80   Resp 16   Ht 5\' 9"  (1.753 m)   Wt 231 lb (104.8 kg)   LMP 06/15/2019   Breastfeeding Yes   BMI 34.11 kg/m  CONSTITUTIONAL: Well-developed, well-nourished female in no acute distress.  HENT:  Normocephalic, atraumatic EYES: Conjunctivae and EOM are normal NECK: Normal range of motion NEUROLOGIC: Alert and oriented to person, place, and time PSYCHIATRIC: Normal mood and affect CARDIOVASCULAR: Normal heart rate noted RESPIRATORY: Effort and rate normal MUSCULOSKELETAL: Normal range of motion  Labs and Imaging No results found for this or any previous visit (from the past 24 hour(s)).  Assessment & Plan:   1. Hypertension in pregnancy, preeclampsia, delivered   - stop Procardia - continue Lisinopril - monitor home BPs Follow up in 2-3 weeks for BP check- virtual   Total face-to-face time with patient: 15 minutes   06/17/2019, [name] 04/13/2020 12:40 PM

## 2020-05-04 ENCOUNTER — Telehealth (INDEPENDENT_AMBULATORY_CARE_PROVIDER_SITE_OTHER): Payer: PRIVATE HEALTH INSURANCE | Admitting: Certified Nurse Midwife

## 2020-05-04 ENCOUNTER — Other Ambulatory Visit: Payer: Self-pay

## 2020-05-04 ENCOUNTER — Encounter: Payer: Self-pay | Admitting: Certified Nurse Midwife

## 2020-05-04 DIAGNOSIS — I10 Essential (primary) hypertension: Secondary | ICD-10-CM

## 2020-05-04 DIAGNOSIS — Z8759 Personal history of other complications of pregnancy, childbirth and the puerperium: Secondary | ICD-10-CM | POA: Diagnosis not present

## 2020-05-04 DIAGNOSIS — O1415 Severe pre-eclampsia, complicating the puerperium: Secondary | ICD-10-CM

## 2020-05-04 NOTE — Progress Notes (Signed)
   TELEHEALTH GYNECOLOGY VISIT ENCOUNTER NOTE  I connected with Carla Hall on 05/04/20 at  9:10 AM EST by telephone at home and verified that I am speaking with the correct person using two identifiers.   I discussed the limitations, risks, security and privacy concerns of performing an evaluation and management service by telephone and the availability of in person appointments. I also discussed with the patient that there may be a patient responsible charge related to this service. The patient expressed understanding and agreed to proceed.   History:  Carla Hall is a 24 y.o. G27P1001 female being evaluated today for HTN. She is 2.5 mos postpartum and her pregnancy was complicated by severe PEC. She was taking Procardia and Lisinopril 10 mg but stopped the Procardia 2 weeks ago as instructed. She is feeling well. No complaints.      Past Medical History:  Diagnosis Date  . H/O tympanostomy   . History of COVID-19   . Mild intermittent asthma 04/08/2018   Past Surgical History:  Procedure Laterality Date  . TYMPANOSTOMY TUBE PLACEMENT    . WISDOM TOOTH EXTRACTION     The following portions of the patient's history were reviewed and updated as appropriate: allergies, current medications, past family history, past medical history, past social history, past surgical history and problem list.    Review of Systems:  Pertinent items noted in HPI and remainder of comprehensive ROS otherwise negative.  Physical Exam:   General:  Alert, oriented and cooperative.   Mental Status: Normal Hall and affect perceived. Normal judgment and thought content.  Physical exam deferred due to nature of the encounter  Blood Pressures for the last 2 weeks per pt:  128/75 112/77 120/69 128/77 119/71 124/84 124/77 114/75 128/78 131/77 124/79 126/82 123/74 123/75 126/74 123/79 123/80 114/76  Assessment and Plan:     1. Hypertension in pregnancy, preeclampsia, severe,  delivered/postpartum   - good BP control on 1 agent - reduce dose to 5mg  daily - f/u in 2 weeks for BP check and possible complete discontinuation- virtual - continue daily home BP monitoring  I discussed the assessment and treatment plan with the patient. The patient was provided an opportunity to ask questions and all were answered. The patient agreed with the plan and demonstrated an understanding of the instructions.   The patient was advised to call back or seek an in-person evaluation/go to the ED if the symptoms worsen or if the condition fails to improve as anticipated.  I provided 7 minutes of non-face-to-face time during this encounter.   , CNM [facility] Carla [facility], [facility]

## 2020-05-18 ENCOUNTER — Telehealth (INDEPENDENT_AMBULATORY_CARE_PROVIDER_SITE_OTHER): Payer: PRIVATE HEALTH INSURANCE | Admitting: Certified Nurse Midwife

## 2020-05-18 ENCOUNTER — Other Ambulatory Visit: Payer: Self-pay

## 2020-05-18 ENCOUNTER — Encounter: Payer: Self-pay | Admitting: Certified Nurse Midwife

## 2020-05-18 VITALS — BP 117/75 | Ht 67.0 in

## 2020-05-18 DIAGNOSIS — O1415 Severe pre-eclampsia, complicating the puerperium: Secondary | ICD-10-CM | POA: Diagnosis not present

## 2020-05-18 NOTE — Progress Notes (Signed)
   TELEHEALTH OBSTETRICS VISIT ENCOUNTER NOTE  I connected with [name] on 05/18/20 at 10:10 AM EST by telephone at home and verified that I am speaking with the correct person using two identifiers.   I discussed the limitations, risks, security and privacy concerns of performing an evaluation and management service by telephone and the availability of in person appointments. I also discussed with the patient that there may be a patient responsible charge related to this service. The patient expressed understanding and agreed to proceed.  Subjective:  Carla Hall is a 24 y.o. G1P1001 s/p postpartum and her pregnancy was complicated by severe PEC. She was taking Lisinopril 5 mg but stopped about 4 days ago d/t her BP being low. She is feeling well. No complaints.   The following portions of the patient's history were reviewed and updated as appropriate: allergies, current medications, past family history, past medical history, past social history, past surgical history and problem list.   Objective:   General:  Alert, oriented and cooperative.   Mental Status: Normal mood and affect perceived. Normal judgment and thought content.  Rest of physical exam deferred due to type of encounter  Assessment and Plan:   1. Hypertension in pregnancy, preeclampsia, severe, delivered/postpartum   - BP stable off meds - continue to monitor at home prn - f/u with PCP as needed - f/u with GYN yearly  Please refer to After Visit Summary for other counseling recommendations.   I provided 10 minutes of non-face-to-face time during this encounter.  Return in about 1 year (around 05/18/2021).  No future appointments.  [name], CNM [facility] [employer]

## 2020-05-22 ENCOUNTER — Encounter: Payer: Self-pay | Admitting: Osteopathic Medicine

## 2020-05-22 ENCOUNTER — Telehealth (INDEPENDENT_AMBULATORY_CARE_PROVIDER_SITE_OTHER): Payer: PRIVATE HEALTH INSURANCE | Admitting: Osteopathic Medicine

## 2020-05-22 VITALS — Wt 220.0 lb

## 2020-05-22 DIAGNOSIS — J019 Acute sinusitis, unspecified: Secondary | ICD-10-CM | POA: Diagnosis not present

## 2020-05-22 DIAGNOSIS — J069 Acute upper respiratory infection, unspecified: Secondary | ICD-10-CM

## 2020-05-22 MED ORDER — CEFDINIR 300 MG PO CAPS: 300.0000 mg | ORAL_CAPSULE | Freq: Two times a day (BID) | ORAL | 0 refills | Status: DC

## 2020-05-22 NOTE — Progress Notes (Signed)
Telemedicine Visit via  Video & Audio (App used: [email or web address])   I connected with Carla Hall on 05/22/20 at 11:41 AM  by phone or  telemedicine application as noted above  I verified that I am speaking with or regarding  the correct patient using two identifiers.  Participants: Myself, Dr Emeterio Reeve DO Patient: Carla Hall   Patient is in separate location from myself  I am in office at [facility]    I discussed the limitations of evaluation and management  by telemedicine and the availability of in person appointments.  The participant(s) above expressed understanding and  agreed to proceed with this appointment via telemedicine.       History of Present Illness: Carla Hall is a 24 y.o. female who would like to discuss feeling ill    Thurs night 05/17/20 1 yo daughter feeling ill last week Was treated for strep/ear infection - was given amoxicillin At-home COVID test negative on 05/17/20 and 05/21/20 yesterday Pt has had flu vaccine  Feeling fatigued and chills, no fever  Rested over the weekend Congestion over the weekend Cough now, was runny nose but this has improved No body aches at this point No fever   OTC/home treatments: Tylenol for fever, Vitamin C, Robitussin for cough.    Immunization History  Administered Date(s) Administered  . DTaP 09/02/1993, 11/08/1993, 01/15/1994, 01/23/1995, 08/14/2006  . HPV Quadrivalent 01/02/2009, 01/04/2010, 01/16/2011  . Hepatitis A 01/02/2009, 01/04/2010  . Hepatitis B 1995-09-21, 09/02/1993, 11/08/1993  . HiB (PRP-OMP) 09/02/1993, 11/05/1993, 01/15/1994, 01/22/1995  . Hpv 01/02/2009, 01/04/2010, 01/16/2011  . IPV 09/02/1993, 11/08/1993, 01/10/1994, 01/11/1995  . Influenza,inj,Quad PF,6+ Mos 04/15/2018, 04/14/2019, 02/07/2020  . Influenza-Unspecified 03/28/2010, 02/26/2011  . MMR 09/15/1995, 08/14/1998  . Meningococcal Conjugate 01/02/2009  . Meningococcal Polysaccharide 10/06/2016  . Td  08/14/2006  . Tdap 02/09/2015, 01/02/2020  . Typhoid Parenteral 10/06/2016  . Varicella 08/19/1994        Observations/Objective: Wt 220 lb (99.8 kg)   LMP 05/15/2020   Breastfeeding Yes   BMI 34.46 kg/m  BP Readings from Last 3 Encounters:  05/18/20 117/75  04/13/20 116/75  03/13/20 120/78   Exam: Normal Speech.  NAD  Lab and Radiology Results No results found for this or any previous visit (from the past 72 hour(s)). No results found.     Assessment and Plan: 24 y.o. female with The primary encounter diagnosis was Viral URI. A diagnosis of Acute non-recurrent sinusitis, unspecified location was also pertinent to this visit.  COVID seems unlikely but still possibility for false-negative results Flu less likely in vaccinated individual but possible List of OTC Rx ok to take was sent to patient   PDMP not reviewed this encounter. No orders of the defined types were placed in this encounter.  Meds ordered this encounter  Medications  . cefdinir (OMNICEF) 300 MG capsule    Sig: Take 1 capsule (300 mg total) by mouth 2 (two) times daily.    Dispense:  10 capsule    Refill:  0   Patient Instructions  [email or web address] - resource for medications ok to take when breastfeeding.   There is not always good data on what is safe to take, but the following medications are likely fine according to available data: Acetaminophen (Tylenol) 500 mg tablets - take max 2 tablets (1000 mg) every 6 hours (4 times per day)  Ibuprofen (Motrin) 200 mg tablets - take max 4 tablets (800 mg) every 6 hours Nasal Saline if desired to rinse  the nasal passages Phenylephrine (Sudafed) may reduce breast milk production and is not dangerous but maybe not advised  Diphenhydramine (Benadryl) 25 mg tablets - lower doses ok in breastfeeding, take max 1 tablets every 4 hours, child might be a little sleepy if exposed to higher doses Oral Lidocaine prescriptions for  sore throat Dextromethorphan (Robitussin, others) - cough suppressant is fine Guaifenesin (Robitussin, Mucinex, others) - expectorant (helps cough up mucus) is fine (Dextromethorphan and Guaifenesin also come in a combination tablet/syrup) Lozenges w/ Benzocaine + Menthol (Cepacol) Honey - as much as you want! Teas which "coat the throat" - look for ingredients Elm Bark, Licorice Root, Marshmallow Root Don't waste your money on Vitamin C or Echinacea  OK to combine the above medications/remedies!   Fill and take the antibiotics if worsening sinus pressure, or if sinus symptoms persist longer than 7-10 days total.   I recommend COVID vaccination for pregnant/breastfeeing people! There is good evidence this can also protect the fetus/child. If you decide not to be vaccinated, especially with everything going on with Omicron/Delta variants, please minimize your exposure by isolation at home, distancing if needing to be outside the home, consistent use of masks, handwashing, etc!       Instructions sent via MyChart.   Follow Up Instructions: Return if symptoms worsen or fail to improve.    I discussed the assessment and treatment plan with the patient. The patient was provided an opportunity to ask questions and all were answered. The patient agreed with the plan and demonstrated an understanding of the instructions.   The patient was advised to call back or seek an in-person evaluation if any new concerns, if symptoms worsen or if the condition fails to improve as anticipated.  30 minutes of non-face-to-face time was provided during this encounter.      . . . . . . . . . . . . . Marland Kitchen                   Historical information moved to improve visibility of documentation.  Past Medical History:  Diagnosis Date  . H/O tympanostomy   . History of COVID-19   . Mild intermittent asthma 04/08/2018   Past Surgical History:  Procedure Laterality Date  .  TYMPANOSTOMY TUBE PLACEMENT    . WISDOM TOOTH EXTRACTION     Social History   Tobacco Use  . Smoking status: Never Smoker  . Smokeless tobacco: Never Used  Substance Use Topics  . Alcohol use: Not Currently    Comment: 1/WK   family history includes Diabetes Mellitus I in her sister.  Medications: Current Outpatient Medications  Medication Sig Dispense Refill  . albuterol (PROVENTIL HFA;VENTOLIN HFA) 108 (90 Base) MCG/ACT inhaler Inhale 2 puffs into the lungs every 6 (six) hours as needed for wheezing or shortness of breath. 1 Inhaler 0  . cefdinir (OMNICEF) 300 MG capsule Take 1 capsule (300 mg total) by mouth 2 (two) times daily. 10 capsule 0  . lisinopril (ZESTRIL) 10 MG tablet Take 1 tablet (10 mg total) by mouth daily. (Patient not taking: Reported on 05/22/2020) 30 tablet 1  . NIFEdipine (ADALAT CC) 30 MG 24 hr tablet Take 1 tablet (30 mg total) by mouth at bedtime. (Patient not taking: Reported on 05/22/2020) 30 tablet 1   No current facility-administered medications for this visit.   Allergies  Allergen Reactions  . Penicillins Rash

## 2020-05-22 NOTE — Patient Instructions (Addendum)
[email or web address] - resource for medications ok to take when breastfeeding.   There is not always good data on what is safe to take, but the following medications are likely fine according to available data: Acetaminophen (Tylenol) 500 mg tablets - take max 2 tablets (1000 mg) every 6 hours (4 times per day)  Ibuprofen (Motrin) 200 mg tablets - take max 4 tablets (800 mg) every 6 hours Nasal Saline if desired to rinse the nasal passages Phenylephrine (Sudafed) may reduce breast milk production and is not dangerous but maybe not advised  Diphenhydramine (Benadryl) 25 mg tablets - lower doses ok in breastfeeding, take max 1 tablets every 4 hours, child might be a little sleepy if exposed to higher doses Oral Lidocaine prescriptions for sore throat Dextromethorphan (Robitussin, others) - cough suppressant is fine Guaifenesin (Robitussin, Mucinex, others) - expectorant (helps cough up mucus) is fine (Dextromethorphan and Guaifenesin also come in a combination tablet/syrup) Lozenges w/ Benzocaine + Menthol (Cepacol) Honey - as much as you want! Teas which "coat the throat" - look for ingredients Elm Bark, Licorice Root, Marshmallow Root Don't waste your money on Vitamin C or Echinacea  OK to combine the above medications/remedies!   Fill and take the antibiotics if worsening sinus pressure, or if sinus symptoms persist longer than 7-10 days total.   I recommend COVID vaccination for pregnant/breastfeeing people! There is good evidence this can also protect the fetus/child. If you decide not to be vaccinated, especially with everything going on with Omicron/Delta variants, please minimize your exposure by isolation at home, distancing if needing to be outside the home, consistent use of masks, handwashing, etc!

## 2021-03-04 IMAGING — US US MFM OB DETAIL+14 WK
1 series · 13 of 28 positions shown · non-contrast
Comparison: none

[Series 1: us mfm ob detail+14 wk · 13 of 84 slices shown]
[im 4/84]
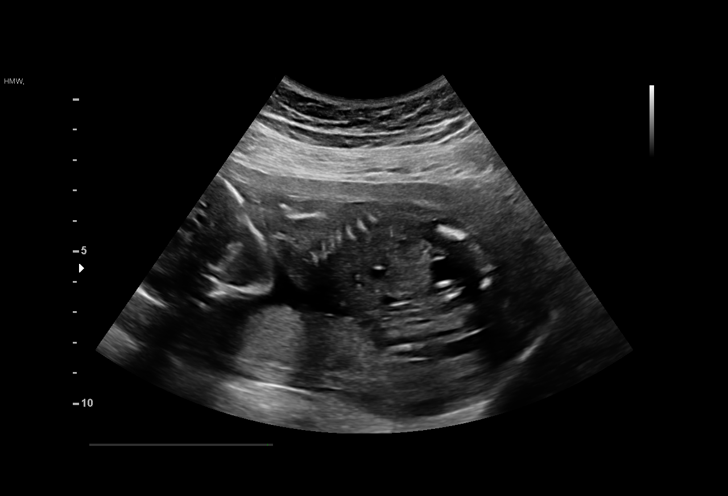
[im 10/84]
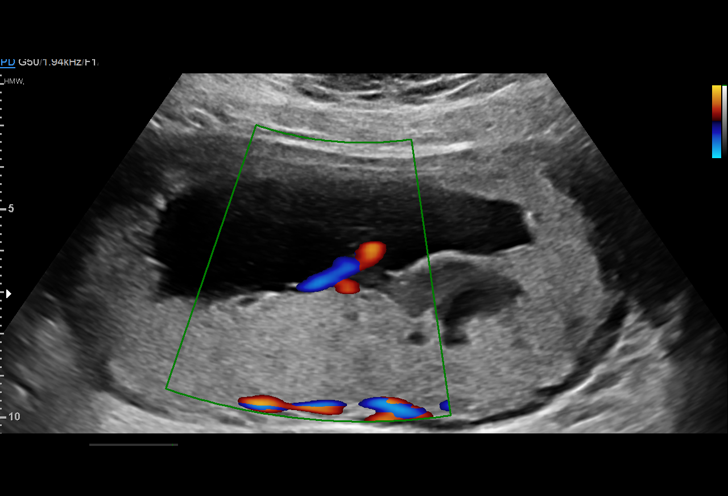
[im 16/84]
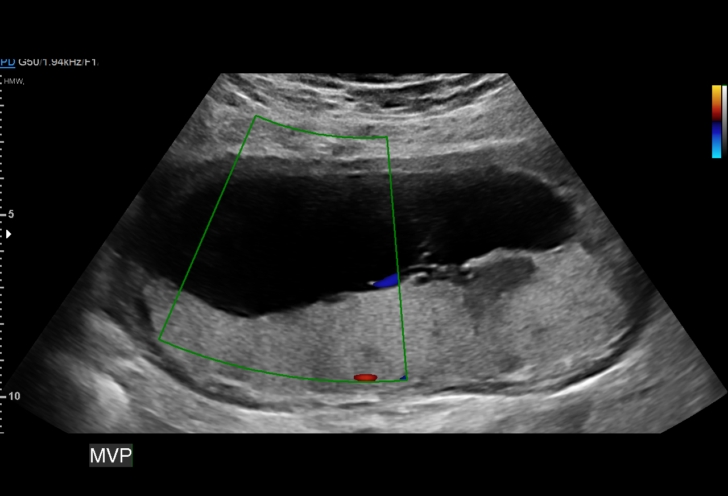
[im 22/84]
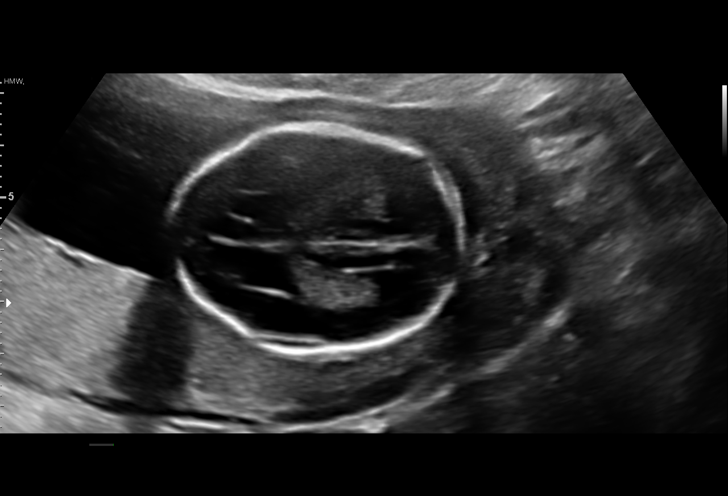
[im 28/84]
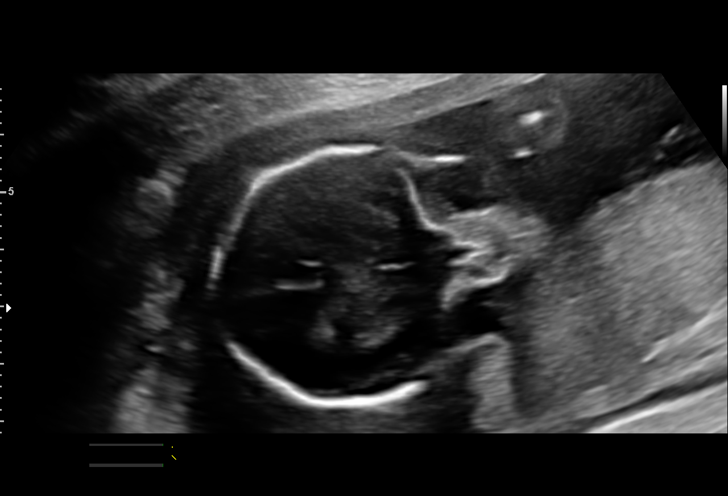
[im 34/84]
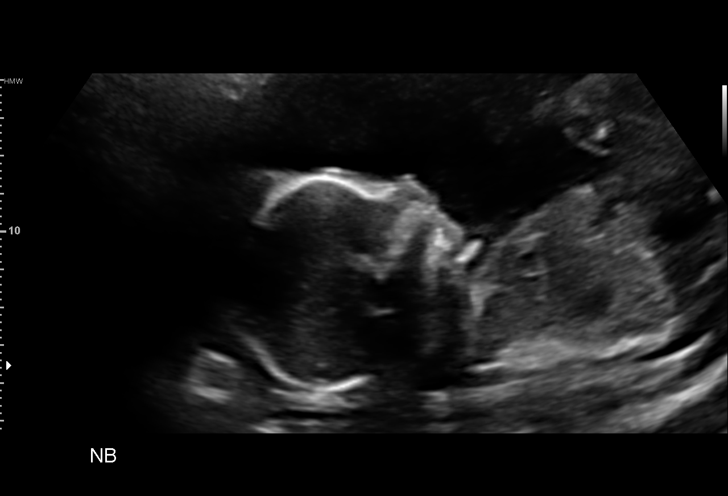
[im 44/84]
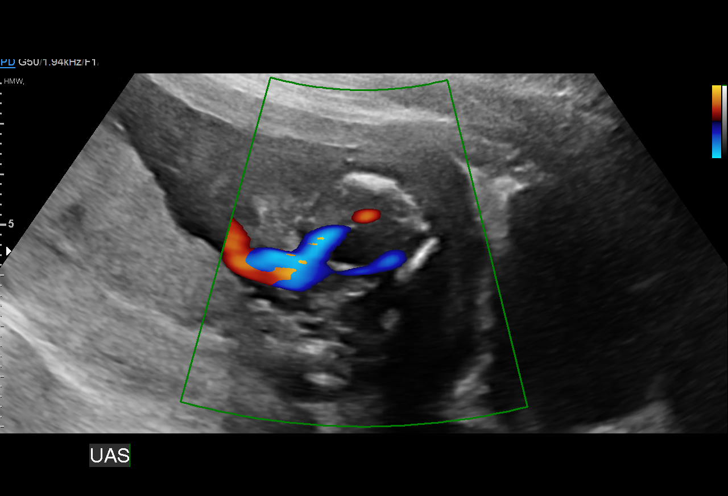
[im 50/84]
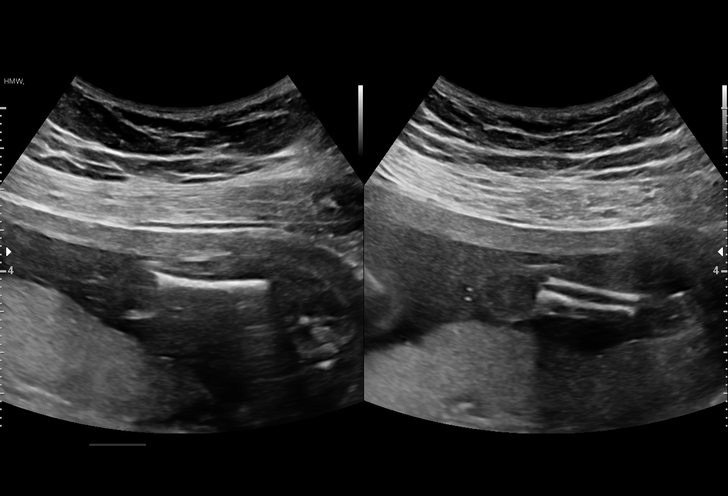
[im 56/84]
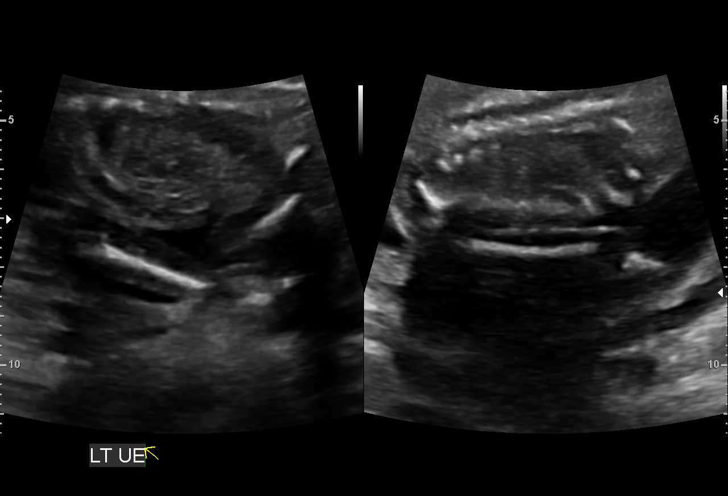
[im 62/84]
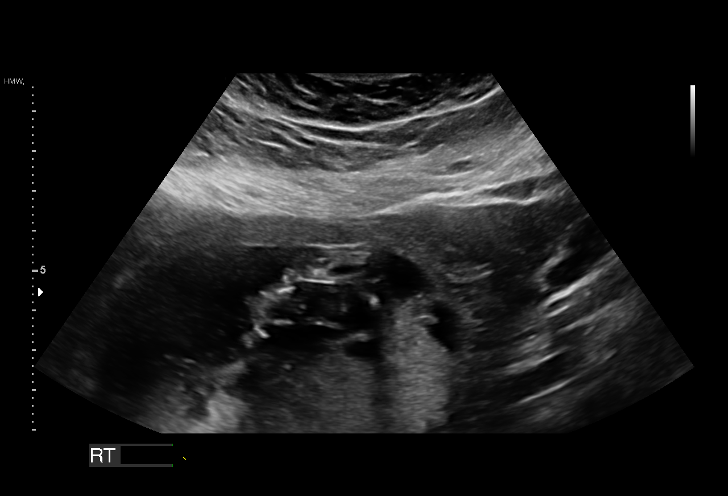
[im 68/84]
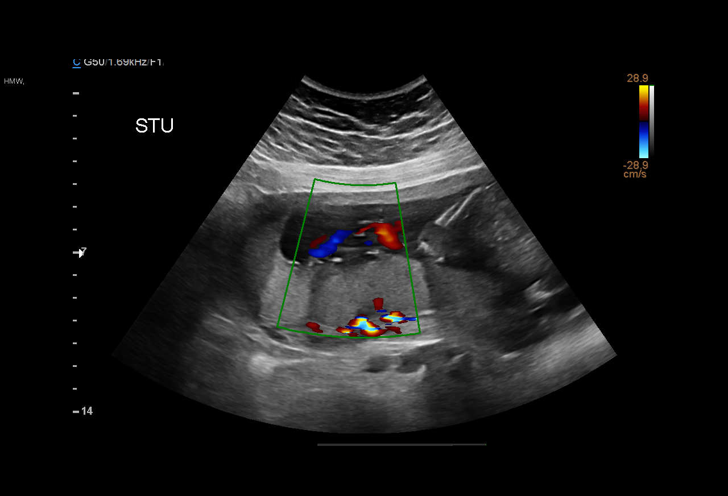
[im 74/84]
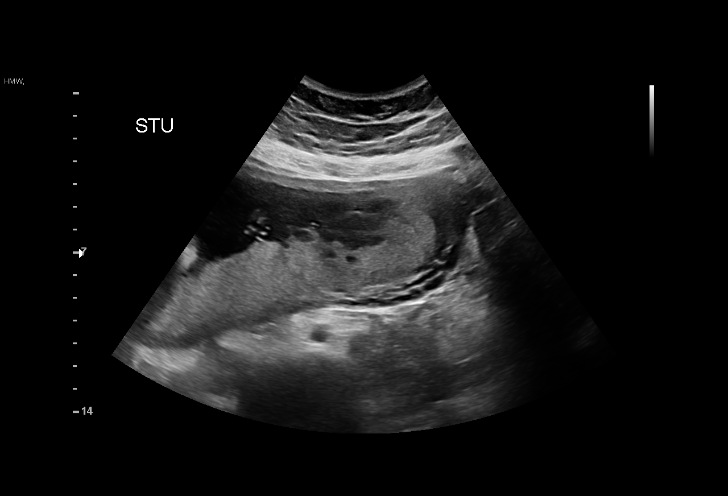
[im 80/84]
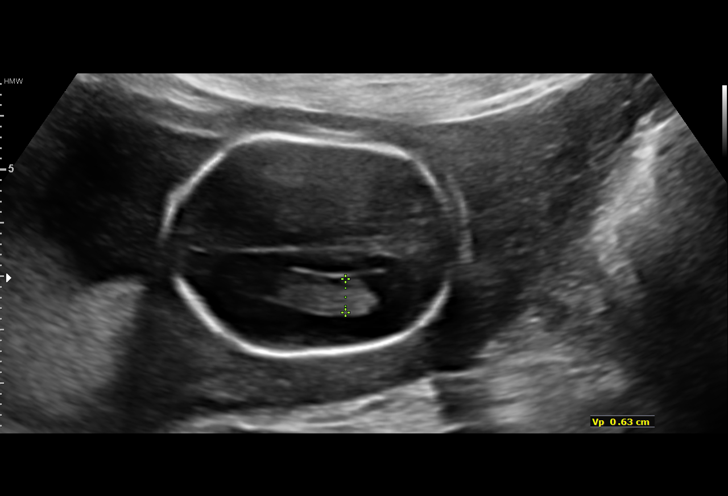

[13 of 28 positions shown; findings below may reference images not displayed]

Indications

 19 weeks gestation of pregnancy
 Encounter for antenatal screening for
 malformations
 Low risk NIPS, W.HVV, neg Horizon, AFP
 pending
 Obesity complicating pregnancy, second
 trimester
Fetal Evaluation

 Num Of Fetuses:         1
 Fetal Heart Rate(bpm):  155
 Cardiac Activity:       Observed
 Presentation:           Breech
 Placenta:               Posterior
 P. Cord Insertion:      Visualized, central

 Amniotic Fluid
 AFI FV:      Within normal limits

                             Largest Pocket(cm)

Biometry

 BPD:      45.2  mm     G. Age:  19w 5d         73  %    CI:        78.12   %    70 - 86
                                                         FL/HC:      16.6   %    16.1 -
 HC:      161.8  mm     G. Age:  19w 0d         34  %    HC/AC:      1.18        1.09 -
 AC:      137.3  mm     G. Age:  19w 1d         46  %    FL/BPD:     59.3   %
 FL:       26.8  mm     G. Age:  18w 1d         13  %    FL/AC:      19.5   %    20 - 24
 HUM:      27.5  mm     G. Age:  18w 6d         41  %
 CER:      19.3  mm     G. Age:  18w 5d         37  %
 NFT:       2.7  mm
 CM:        6.4  mm
 Est. FW:     256  gm      0 lb 9 oz     25  %
OB History

 Gravidity:    1         Term:   0        Prem:   0        SAB:   0
 TOP:          0       Ectopic:  0        Living: 0
Gestational Age

 LMP:           19w 1d        Date:  06/15/19                 EDD:   03/21/20
 U/S Today:     19w 0d                                        EDD:   03/22/20
 Best:          19w 1d     Det. By:  LMP  (06/15/19)          EDD:   03/21/20
Anatomy

 Cranium:               Appears normal         LVOT:                   Not well visualized
 Cavum:                 Appears normal         Aortic Arch:            Not well visualized
 Ventricles:            Appears normal         Ductal Arch:            Not well visualized
 Choroid Plexus:        Appears normal         Diaphragm:              Appears normal
 Cerebellum:            Appears normal         Stomach:                Appears normal, left
                                                                       sided
 Posterior Fossa:       Appears normal         Abdomen:                Appears normal
 Nuchal Fold:           Appears normal         Abdominal Wall:         Appears nml (cord
                                                                       insert, abd wall)
 Face:                  Appears normal         Cord Vessels:           Appears normal (3
                        (orbits and profile)                           vessel cord)
 Lips:                  Appears normal         Kidneys:                Appear normal
 Palate:                Not well visualized    Bladder:                Appears normal
 Thoracic:              Appears normal         Spine:                  Appears normal
 Heart:                 Not well visualized    Upper Extremities:      Appears normal
 RVOT:                  Not well visualized    Lower Extremities:      Appears normal

 Other:  Fetus appears to be a male. Heels visualized. Hands not well
         visualized. Nasal bone visualized. Technically difficult due to fetal
         position.
Cervix Uterus Adnexa

 Cervix
 Length:            3.5  cm.
 Normal appearance by transabdominal scan.

 Uterus
 No abnormality visualized.

 Right Ovary
 No adnexal mass visualized.

 Left Ovary
 No adnexal mass visualized.

 Cul De Sac
 No free fluid seen.

 Adnexa
 No abnormality visualized.
Impression

 We performed fetal anatomy scan. No makers of
 aneuploidies or fetal structural defects are seen. Fetal
 biometry is consistent with her previously-established dates.
 Amniotic fluid is normal and good fetal activity is seen.
 Patient understands the limitations of ultrasound in detecting
 fetal anomalies.
 Maternal obesity imposes limitations on the resolution of
 images, and failure to detect fetal anomalies is more common
 in obese pregnant women.

 On cell-free fetal DNA screening, the risks of fetal
 aneuploidies are not increased. Patient had opted not to
 screen for MSAFP (open-neural tube defects).
Recommendations

 -An appointment was made for her to return in 4 weeks for
 completion of fetal anatomy.
                      Mjedis, [place]

## 2021-04-04 ENCOUNTER — Ambulatory Visit (INDEPENDENT_AMBULATORY_CARE_PROVIDER_SITE_OTHER): Payer: 59 | Admitting: Family Medicine

## 2021-04-04 ENCOUNTER — Encounter: Payer: Self-pay | Admitting: Family Medicine

## 2021-04-04 VITALS — BP 135/78 | HR 76 | Temp 97.9°F | Wt 217.1 lb

## 2021-04-04 DIAGNOSIS — J0140 Acute pansinusitis, unspecified: Secondary | ICD-10-CM | POA: Diagnosis not present

## 2021-04-04 DIAGNOSIS — Z13220 Encounter for screening for lipoid disorders: Secondary | ICD-10-CM | POA: Diagnosis not present

## 2021-04-04 DIAGNOSIS — Z0000 Encounter for general adult medical examination without abnormal findings: Secondary | ICD-10-CM

## 2021-04-04 MED ORDER — DOXYCYCLINE HYCLATE 100 MG PO TABS: 100.0000 mg | ORAL_TABLET | Freq: Two times a day (BID) | ORAL | 0 refills | Status: AC

## 2021-04-04 NOTE — Progress Notes (Signed)
BP 135/78 (BP Location: Left Arm, Patient Position: Sitting, Cuff Size: Normal)   Pulse 76   Temp 97.9 F (36.6 C) (Oral)   Wt 217 lb 1.3 oz (98.5 kg)   SpO2 100%   BMI 34.00 kg/m    Subjective:    Patient ID: Carla Hall, female    DOB: 02/13/1996, 25 y.o.   MRN: [id number]  HPI: Carla Hall is a 25 y.o. female presenting on 04/04/2021 for comprehensive medical examination. Current medical complaints include: starting a mild cold for the past 10 days or so - double sickening with worsening symptoms about 3 days ago , all in her head, 6/10 sinus pressure (worse on right side), green snot.   She currently lives with: husband Interim Problems from her last visit: no   She reports regular vision exams q1-5y: no, no vision problems  She reports regular dental exams q 42m: yes Her diet consists of: well balanced, good variety She endorses exercise and/or activity of: moderate, walking throughout the week  She works at: non-profit work part time/hybrid  She endorses ETOH use.- maybe 3-4 drinks per month She denies nictoine use. She denies illegal substance use.    She reports regular menstrual periods with heavy flow for a few days followed by mod-light, total 5-6 days. Current menopausal symptoms: no She is currently  sexually active with husband She denies  concerns today about STI Contraception choices are: condoms  She denies concerns about skin changes today. She denies concerns about bowel changes today. She denies concerns about bladder changes today.  Depression Screen done today and results listed below:  Depression screen [facility] 2/9 04/04/2021 04/14/2019 04/15/2018 04/08/2018  Decreased Interest 0 0 0 0  Down, Depressed, Hopeless 0 0 0 0  PHQ - 2 Score 0 0 0 0    She does not have a history of falls.   Past Medical History:  Past Medical History:  Diagnosis Date   H/O tympanostomy    History of COVID-19    Mild intermittent asthma 04/08/2018    Surgical  History:  Past Surgical History:  Procedure Laterality Date   TYMPANOSTOMY TUBE PLACEMENT     WISDOM TOOTH EXTRACTION      Medications:  Current Outpatient Medications on File Prior to Visit  Medication Sig   albuterol (PROVENTIL HFA;VENTOLIN HFA) 108 (90 Base) MCG/ACT inhaler Inhale 2 puffs into the lungs every 6 (six) hours as needed for wheezing or shortness of breath.   cefdinir (OMNICEF) 300 MG capsule Take 1 capsule (300 mg total) by mouth 2 (two) times daily. (Patient not taking: Reported on 04/04/2021)   lisinopril (ZESTRIL) 10 MG tablet Take 1 tablet (10 mg total) by mouth daily.   NIFEdipine (ADALAT CC) 30 MG 24 hr tablet Take 1 tablet (30 mg total) by mouth at bedtime.   No current facility-administered medications on file prior to visit.    Allergies:  Allergies  Allergen Reactions   Penicillins Rash    Social History:  Social History   Socioeconomic History   Marital status: Married    Spouse name: Not on file   Number of children: Not on file   Years of education: Not on file   Highest education level: Not on file  Occupational History   Occupation: Non Profit    Employer: [employer]  Tobacco Use   Smoking status: Never   Smokeless tobacco: Never  Vaping Use   Vaping Use: Never used  Substance  and Sexual Activity   Alcohol use: Not Currently    Comment: 1/WK   Drug use: Never   Sexual activity: Not Currently    Partners: Male    Birth control/protection: Condom  Other Topics Concern   Not on file  Social History Narrative   Not on file   Social Determinants of Health   Financial Resource Strain: Not on file  Food Insecurity: Not on file  Transportation Needs: Not on file  Physical Activity: Not on file  Stress: Not on file  Social Connections: Not on file  Intimate Partner Violence: Not on file   Social History   Tobacco Use  Smoking Status Never  Smokeless Tobacco Never   Social History   Substance and Sexual  Activity  Alcohol Use Not Currently   Comment: 1/WK    Family History:  Family History  Problem Relation Age of Onset   Diabetes Mellitus I Sister     Past medical history, surgical history, medications, allergies, family history and social history reviewed with patient today and changes made to appropriate areas of the chart.   All ROS negative except what is listed above and in the HPI.      Objective:    BP 135/78 (BP Location: Left Arm, Patient Position: Sitting, Cuff Size: Normal)   Pulse 76   Temp 97.9 F (36.6 C) (Oral)   Wt 217 lb 1.3 oz (98.5 kg)   SpO2 100%   BMI 34.00 kg/m   Wt Readings from Last 3 Encounters:  04/04/21 217 lb 1.3 oz (98.5 kg)  05/22/20 220 lb (99.8 kg)  04/13/20 231 lb (104.8 kg)    Physical Exam Vitals reviewed.  Constitutional:      Appearance: Normal appearance.  HENT:     Head: Normocephalic and atraumatic.     Right Ear: Tympanic membrane normal.     Left Ear: Tympanic membrane normal.     Nose: Congestion and rhinorrhea present.     Mouth/Throat:     Mouth: Mucous membranes are moist.     Pharynx: Oropharynx is clear.  Eyes:     Extraocular Movements: Extraocular movements intact.     Conjunctiva/sclera: Conjunctivae normal.     Pupils: Pupils are equal, round, and reactive to light.  Cardiovascular:     Rate and Rhythm: Normal rate and regular rhythm.     Pulses: Normal pulses.     Heart sounds: Normal heart sounds.  Pulmonary:     Effort: Pulmonary effort is normal.     Breath sounds: Normal breath sounds.  Abdominal:     General: Abdomen is flat. Bowel sounds are normal.     Palpations: Abdomen is soft.     Tenderness: There is no right CVA tenderness or left CVA tenderness.  Musculoskeletal:        General: Normal range of motion.     Cervical back: Normal range of motion and neck supple.  Skin:    General: Skin is warm and dry.     Capillary Refill: Capillary refill takes less than 2 seconds.  Neurological:      General: No focal deficit present.     Mental Status: She is alert and oriented to person, place, and time. Mental status is at baseline.  Psychiatric:        Hall and Affect: Hall normal.        Behavior: Behavior normal.        Thought Content: Thought content normal.  Judgment: Judgment normal.      Results for orders placed or performed during the hospital encounter of 02/29/20  CBC  Result Value Ref Range   WBC 11.9 (H) 4.0 - 10.5 K/uL   RBC 3.48 (L) 3.87 - 5.11 MIL/uL   Hemoglobin 10.1 (L) 12.0 - 15.0 g/dL   HCT 95.6 (L) 38.7 - 56.4 %   MCV 89.4 80.0 - 100.0 fL   MCH 29.0 26.0 - 34.0 pg   MCHC 32.5 30.0 - 36.0 g/dL   RDW 33.2 95.1 - 88.4 %   Platelets 269 150 - 400 K/uL   nRBC 0.0 0.0 - 0.2 %  RPR  Result Value Ref Range   RPR Ser Ql NON REACTIVE NON REACTIVE  Comprehensive metabolic panel  Result Value Ref Range   Sodium 137 135 - 145 mmol/L   Potassium 3.2 (L) 3.5 - 5.1 mmol/L   Chloride 107 98 - 111 mmol/L   CO2 18 (L) 22 - 32 mmol/L   Glucose, Bld 88 70 - 99 mg/dL   BUN 5 (L) 6 - 20 mg/dL   Creatinine, Ser 1.66 0.44 - 1.00 mg/dL   Calcium 8.9 8.9 - 06.3 mg/dL   Total Protein 6.3 (L) 6.5 - 8.1 g/dL   Albumin 2.6 (L) 3.5 - 5.0 g/dL   AST 18 15 - 41 U/L   ALT 15 0 - 44 U/L   Alkaline Phosphatase 121 38 - 126 U/L   Total Bilirubin 0.5 0.3 - 1.2 mg/dL   GFR calc non Af Amer >60 >60 mL/min   GFR calc Af Amer >60 >60 mL/min   Anion gap 12 5 - 15  Protein / creatinine ratio, urine  Result Value Ref Range   Creatinine, Urine 104.11 mg/dL   Total Protein, Urine 47 mg/dL   Protein Creatinine Ratio 0.45 (H) 0.00 - 0.15 mg/mg[Cre]  CBC  Result Value Ref Range   WBC 12.1 (H) 4.0 - 10.5 K/uL   RBC 3.49 (L) 3.87 - 5.11 MIL/uL   Hemoglobin 10.1 (L) 12.0 - 15.0 g/dL   HCT 01.6 (L) 01.0 - 93.2 %   MCV 87.4 80.0 - 100.0 fL   MCH 28.9 26.0 - 34.0 pg   MCHC 33.1 30.0 - 36.0 g/dL   RDW 35.5 73.2 - 20.2 %   Platelets 272 150 - 400 K/uL   nRBC 0.0 0.0 - 0.2 %   Magnesium  Result Value Ref Range   Magnesium 3.5 (H) 1.7 - 2.4 mg/dL  CBC  Result Value Ref Range   WBC 16.6 (H) 4.0 - 10.5 K/uL   RBC 3.39 (L) 3.87 - 5.11 MIL/uL   Hemoglobin 10.0 (L) 12.0 - 15.0 g/dL   HCT 54.2 (L) 70.6 - 23.7 %   MCV 89.7 80.0 - 100.0 fL   MCH 29.5 26.0 - 34.0 pg   MCHC 32.9 30.0 - 36.0 g/dL   RDW 62.8 31.5 - 17.6 %   Platelets 305 150 - 400 K/uL   nRBC 0.0 0.0 - 0.2 %  Type and screen  Result Value Ref Range   ABO/RH(D) A POS    Antibody Screen NEG    Sample Expiration      03/03/2020,2359 Performed at [facility], [address] [place], [place] [address]       Assessment & Plan:   Problem List Items Addressed This Visit   None Visit Diagnoses     Annual physical exam    -  Primary  Relevant Orders   CBC with Differential   COMPLETE METABOLIC PANEL WITH GFR   Lipid panel   Screening, lipid       Relevant Orders   Lipid panel   Acute non-recurrent pansinusitis       Relevant Medications   doxycycline (VIBRA-TABS) 100 MG tablet      Duration and double sickening consistent with sinusitis. Allergic to PCN. Treating with doxycycline (confirmed she is not pregnant/breastfeeding). Continue supportive measures. Follow-up if not improving or if symptoms worsen. Patient aware of signs/symptoms requiring further/urgent evaluation.   LABORATORY TESTING:  - Health maintenance labs ordered today as discussed above.           CBC, CMP, LIPIDS          TSH         - STI testing: not applicable (USPSTF recommends Gonorrhea/Chlamydia screening (urine) in all sexually active females (or on OCP) age 58 and under; continue after age 94 if high risk) - Pap smear:  due, will schedule separately    IMMUNIZATIONS:   - Tdap: Tetanus vaccination status reviewed: last tetanus booster within 10 years. - Influenza:  postponed due to current sickness - Pneumovax: Not applicable - Prevnar: Not applicable - HPV: Not applicable - Shingrix vaccine:  Administered today - COVID-19: Not applicable  SCREENING: - Mammogram: Not applicable - Bone Density: Not applicable - Colonoscopy: Not applicable  Discussed with patient purpose of the colonoscopy is to detect colon cancer at curable precancerous or early stages  - AAA Screening: Not applicable  -Hearing Test: Not applicable  -Spirometry: Not applicable  - Lung Cancer Screening: Not applicable    PATIENT COUNSELING:   Advised to take 1 mg of folate supplement per day if capable of pregnancy.   Sexuality: Discussed sexually transmitted diseases, partner selection, use of condoms, avoidance of unintended pregnancy, and contraceptive alternatives.    I discussed with the patient that most people either abstain from alcohol or drink within safe limits (<=14/week and <=4 drinks/occasion for males, <=7/weeks and <= 3 drinks/occasion for females) and that the risk for alcohol disorders and other health effects rises proportionally with the number of drinks per week and how often a drinker exceeds daily limits.  Discussed cessation/primary prevention of drug use and availability of treatment for abuse.   Diet: Encouraged to adjust caloric intake to maintain or achieve ideal body weight, to reduce intake of dietary saturated fat and total fat, to limit sodium intake by avoiding high sodium foods and not adding table salt, and to maintain adequate dietary potassium and calcium preferably from fresh fruits, vegetables, and low-fat dairy products. Encouraged vitamin D 1000 units and Calcium 1300mg  or 4 servings of dairy a day.  Emphasized the importance of regular exercise.  Injury prevention: Discussed safety belts, safety helmets, smoke detector, smoking near bedding or upholstery.   Dental health: Discussed importance of regular tooth brushing, flossing, and dental visits.  Follow up plan:  Return in about 1 year (around 04/04/2022). Please contact office for sooner follow-up if symptoms do  not improve or worsen. Seek emergency care if symptoms become severe.   13/07/2021 Lollie Marrow, DNP, FNP-C

## 2021-04-16 ENCOUNTER — Encounter: Payer: Self-pay | Admitting: Physician Assistant

## 2021-04-16 ENCOUNTER — Telehealth: Payer: 59 | Admitting: Physician Assistant

## 2021-04-16 DIAGNOSIS — J029 Acute pharyngitis, unspecified: Secondary | ICD-10-CM | POA: Diagnosis not present

## 2021-04-16 DIAGNOSIS — H9209 Otalgia, unspecified ear: Secondary | ICD-10-CM | POA: Diagnosis not present

## 2021-04-16 MED ORDER — CLINDAMYCIN HCL 300 MG PO CAPS: 300.0000 mg | ORAL_CAPSULE | Freq: Three times a day (TID) | ORAL | 0 refills | Status: AC

## 2021-04-16 MED ORDER — CEFDINIR 300 MG PO CAPS: 300.0000 mg | ORAL_CAPSULE | Freq: Two times a day (BID) | ORAL | 0 refills | Status: AC

## 2021-04-16 NOTE — Progress Notes (Signed)
Carla Hall, mullet are scheduled for a virtual visit with your provider today.    Just as we do with appointments in the office, we must obtain your consent to participate.  Your consent will be active for this visit and any virtual visit you may have with one of our providers in the next 365 days.    If you have a MyChart account, I can also send a copy of this consent to you electronically.  All virtual visits are billed to your insurance company just like a traditional visit in the office.  As this is a virtual visit, video technology does not allow for your provider to perform a traditional examination.  This may limit your provider's ability to fully assess your condition.  If your provider identifies any concerns that need to be evaluated in person or the need to arrange testing such as labs, EKG, etc, we will make arrangements to do so.    Although advances in technology are sophisticated, we cannot ensure that it will always work on either your end or our end.  If the connection with a video visit is poor, we may have to switch to a telephone visit.  With either a video or telephone visit, we are not always able to ensure that we have a secure connection.   I need to obtain your verbal consent now.   Are you willing to proceed with your visit today?   Carla Hall has provided verbal consent on 04/16/2021 for a virtual visit (video or telephone).   Carla Meres, PA-C 04/16/2021  7:47 AM   Date:  04/16/2021   ID:  Carla Hall, DOB [birth year], MRN [id number]  Patient Location: Home Provider Location: Home Office   Participants: Patient and Provider for Visit and Wrap up  Method of visit: Video  Location of Patient: Home Location of Provider: Home Office Consent was obtain for visit over the video. Services rendered by provider: Visit was performed via video  A video enabled telemedicine application was used and I verified that I am speaking with the correct person using two  identifiers.  PCP:  Sunnie Nielsen, DO   Chief Complaint:  sore throat  History of Present Illness:    Carla Hall is a 25 y.o. female with history as stated below. Presents video telehealth for an acute care visit  She was dx with sinusitis on 11/3 and started on doxycycline at that time. She states she did not finish this rx at the time and now her sxs have continued.   Her main complaint is a sore throat on the right side and she is also c/o pain that radiates to her ear.   She reports mild congestion and mild cough. She does report some post nasal drip. Denies fevers.   Past Medical, Surgical, Social History, Allergies, and Medications have been Reviewed.  Past Medical History:  Diagnosis Date   H/O tympanostomy    History of COVID-19    Mild intermittent asthma 04/08/2018    No outpatient medications have been marked as taking for the 04/16/21 encounter (Appointment) with Samson Frederic, Jakye Mullens S, PA-C.     Allergies:   Penicillins   ROS See HPI for history of present illness.  Physical Exam HENT:     Head: Normocephalic.     Nose: Nose normal.     Mouth/Throat:     Mouth: Mucous membranes are moist.     Comments: No unilateral tonsillar edema. No hot potato voice. Tolerating  secretions. Neurological:     Mental Status: She is alert.   MDM: Pt recently dx with bacterial sinusitis. Sxs have continued after partially completing a course of abx. Will give rx to cover for persistent sinusitis.   There are no diagnoses linked to this encounter.   Time:   Today, I have spent 15 minutes with the patient with telehealth technology discussing the above problems, reviewing the chart, previous notes, medications and orders.    Tests Ordered: No orders of the defined types were placed in this encounter.   Medication Changes: No orders of the defined types were placed in this encounter.    Disposition:  Follow up  Signed, Carla Meres, PA-C  04/16/2021  7:47 AM

## 2021-04-16 NOTE — Patient Instructions (Signed)
  Carla Hall, thank you for joining Karrie Meres, PA-C for today's virtual visit.  While this provider is not your primary care provider (PCP), if your PCP is located in our provider database this encounter information will be shared with them immediately following your visit.  Consent: (Patient) Carla Hall provided verbal consent for this virtual visit at the beginning of the encounter.  Current Medications:  Current Outpatient Medications:    cefdinir (OMNICEF) 300 MG capsule, Take 1 capsule (300 mg total) by mouth 2 (two) times daily for 7 days., Disp: 14 capsule, Rfl: 0   clindamycin (CLEOCIN) 300 MG capsule, Take 1 capsule (300 mg total) by mouth 3 (three) times daily for 7 days., Disp: 21 capsule, Rfl: 0   albuterol (PROVENTIL HFA;VENTOLIN HFA) 108 (90 Base) MCG/ACT inhaler, Inhale 2 puffs into the lungs every 6 (six) hours as needed for wheezing or shortness of breath., Disp: 1 Inhaler, Rfl: 0   cefdinir (OMNICEF) 300 MG capsule, Take 1 capsule (300 mg total) by mouth 2 (two) times daily. (Patient not taking: Reported on 04/04/2021), Disp: 10 capsule, Rfl: 0   lisinopril (ZESTRIL) 10 MG tablet, Take 1 tablet (10 mg total) by mouth daily., Disp: 30 tablet, Rfl: 1   NIFEdipine (ADALAT CC) 30 MG 24 hr tablet, Take 1 tablet (30 mg total) by mouth at bedtime., Disp: 30 tablet, Rfl: 1   Medications ordered in this encounter:  Meds ordered this encounter  Medications   cefdinir (OMNICEF) 300 MG capsule    Sig: Take 1 capsule (300 mg total) by mouth 2 (two) times daily for 7 days.    Dispense:  14 capsule    Refill:  0    Order Specific Question:   Supervising Provider    Answer:   Hyacinth Meeker, BRIAN [[id number]]   clindamycin (CLEOCIN) 300 MG capsule    Sig: Take 1 capsule (300 mg total) by mouth 3 (three) times daily for 7 days.    Dispense:  21 capsule    Refill:  0    Order Specific Question:   Supervising Provider    Answer:   Hyacinth Meeker, BRIAN [[id number]]     *If you need refills on  other medications prior to your next appointment, please contact your pharmacy*  Follow-Up: Call back or seek an in-person evaluation if the symptoms worsen or if the condition fails to improve as anticipated.  Other Instructions Take the antibiotics as directed   Continue supportive therapy at home with tylenol, motrin, cough drops, gargling with salt water, staying well hydrated and getting plenty of rest.   Follow up with your regular doctor in 1 week for reassessment. Seek care sooner for new or worsening symptoms.   If you have been instructed to have an in-person evaluation today at a local Urgent Care facility, please use the link below. It will take you to a list of all of our available [facility], including address, phone number and hours of operation. Please do not delay care.  [facility]  If you or a family member do not have a primary care provider, use the link below to schedule a visit and establish care. When you choose a [facility] primary care physician or advanced practice provider, you gain a long-term partner in health. Find a Primary Care Provider  Learn more about [facility] in-office and virtual care options: [facility] - Get Care Now

## 2021-04-23 ENCOUNTER — Telehealth: Payer: Self-pay | Admitting: *Deleted

## 2021-04-23 NOTE — Telephone Encounter (Signed)
Left patient a message to call the office to schedule annual that is due after 05/18/2021.

## 2021-06-01 IMAGING — US US MFM FETAL BPP W/O NON-STRESS
1 series · 13 of 28 positions shown · non-contrast
Comparison: none

[Series 1: us mfm fetal bpp w/o non-stress · 45 acquisitions, 13 frames shown]
[im 2/45]
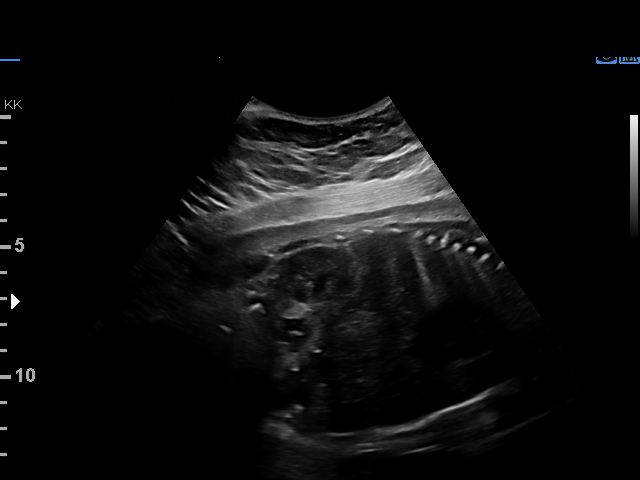
[im 5/45]
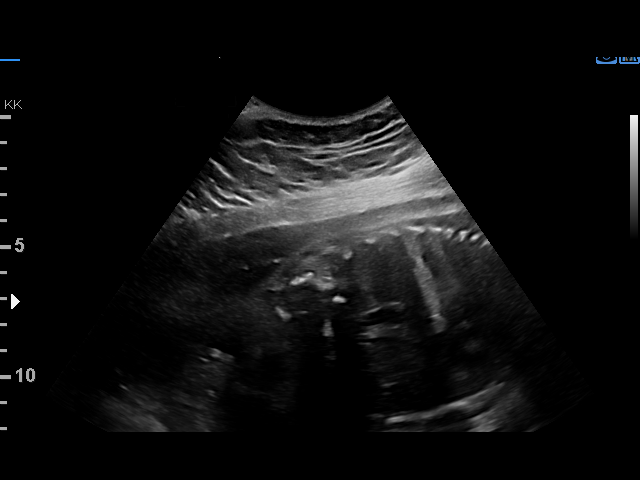
[im 9/45]
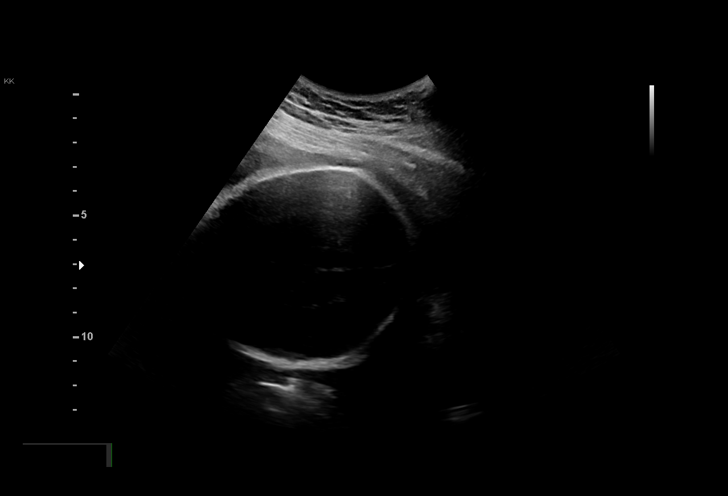
[im 12/45]
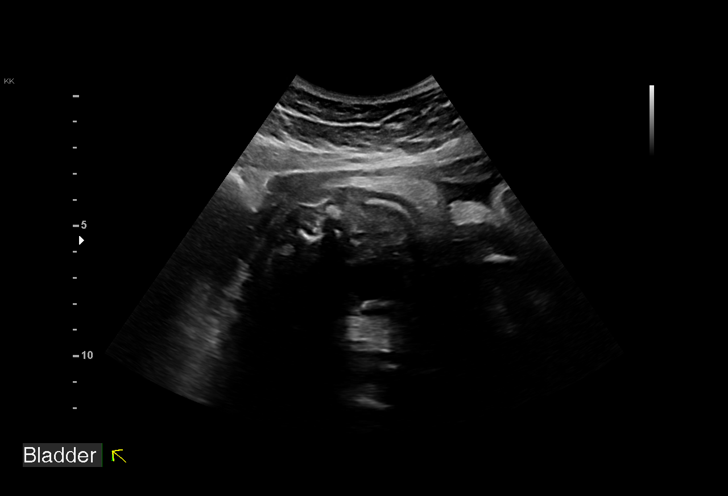
[im 15/45]
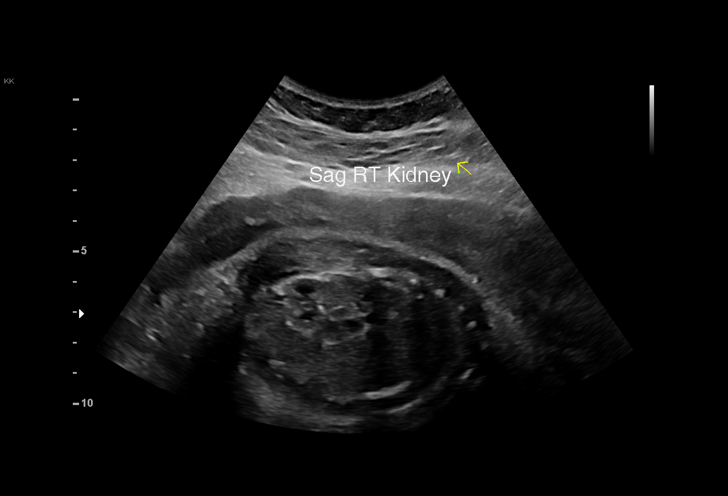
[im 18/45]
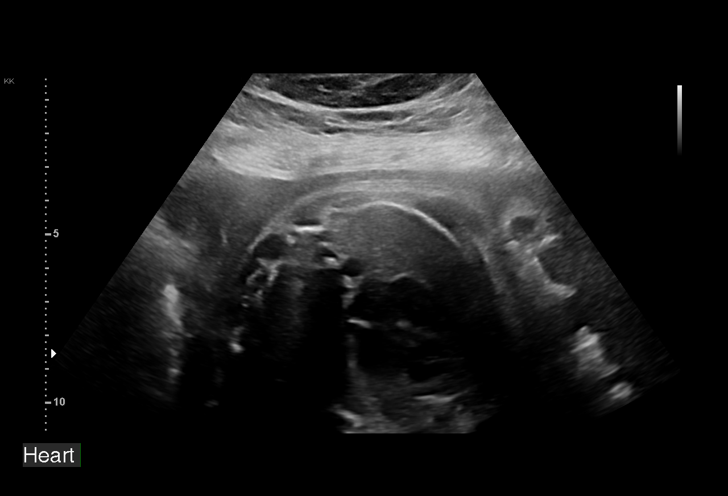
[im 23/45]
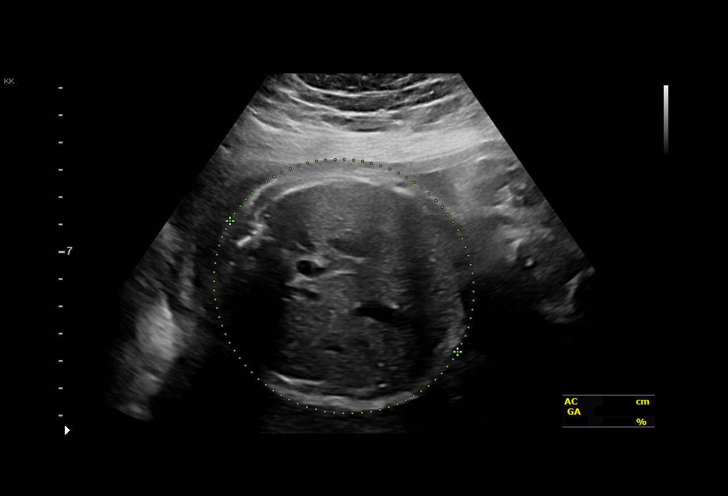
[im 27/45]
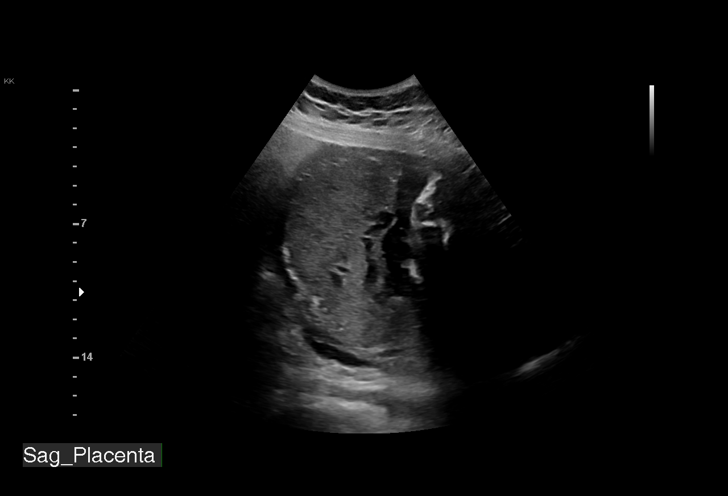
[im 30/45]
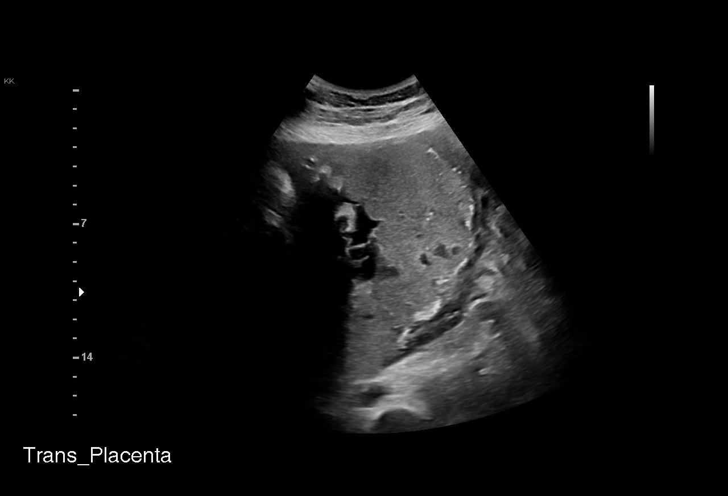
[im 33/45]
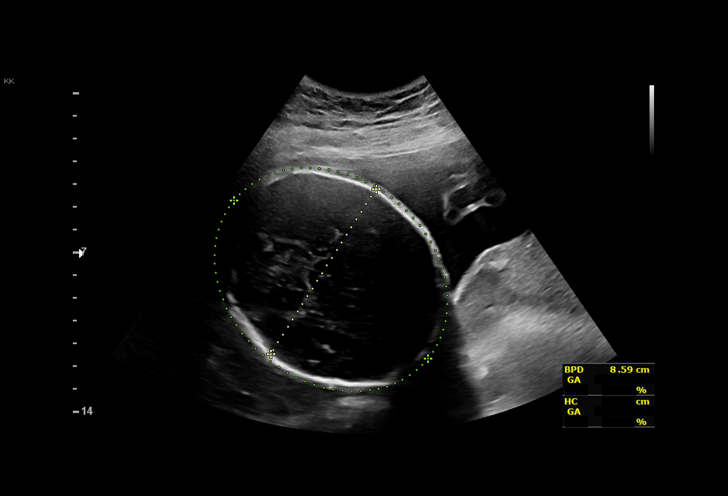
[im 36/45]
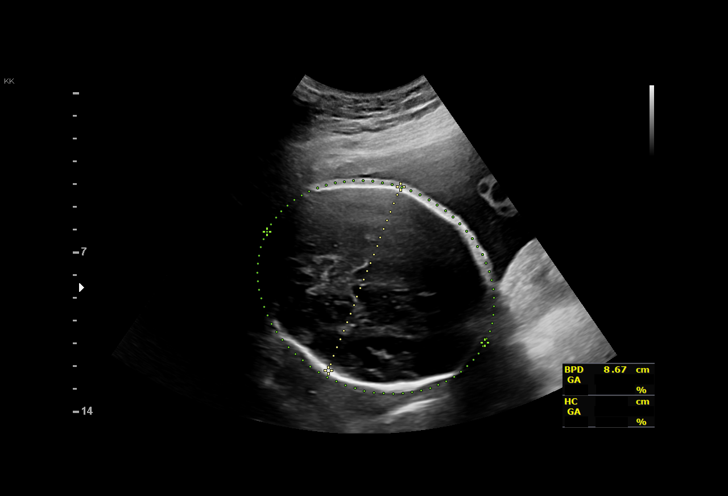
[im 40/45]
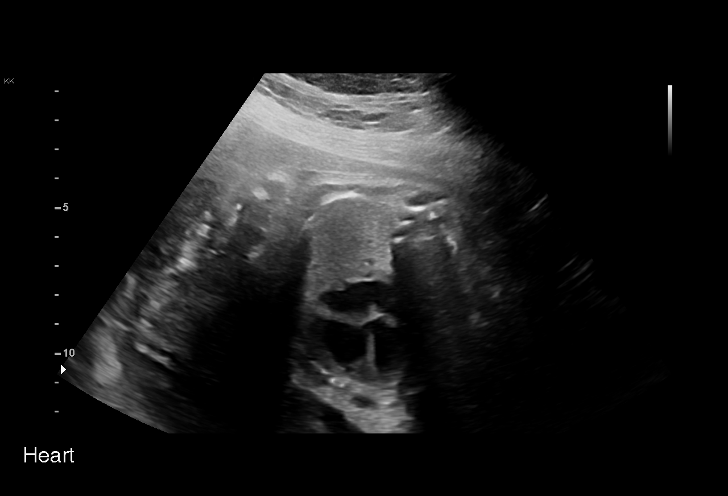
[im 43/45]
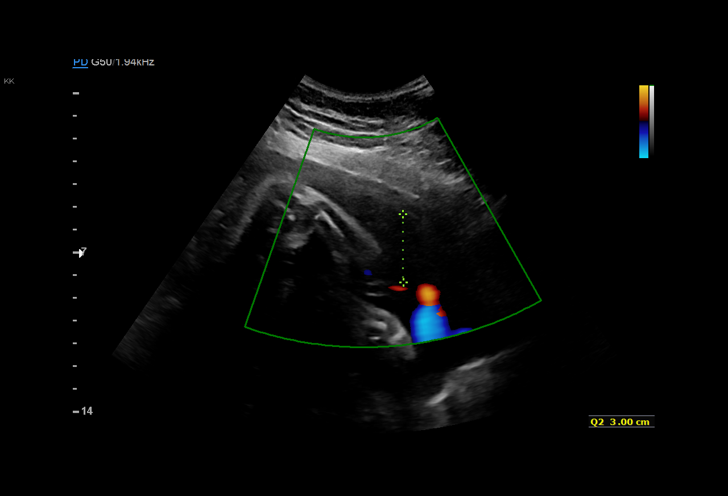

[13 of 28 positions shown; findings below may reference images not displayed]

Indications

 Gestational hypertension without significant
 proteinuria, third trimester (No Meds)
 Obesity complicating pregnancy, third
 trimester (Pregravid BMI 31)
 Low risk NIPS, 7.9MM, neg Horizon, AFP
 pending
 31 weeks gestation of pregnancy
 Encounter for other antenatal screening
 follow-up
Fetal Evaluation

 Num Of Fetuses:         1
 Fetal Heart Rate(bpm):  150
 Cardiac Activity:       Observed
 Presentation:           Cephalic
 Placenta:               Posterior
 P. Cord Insertion:      Previously Visualized

 Amniotic Fluid
 AFI FV:      Within normal limits

 AFI Sum(cm)     %Tile       Largest Pocket(cm)
 12.39           34

 RUQ(cm)       RLQ(cm)       LUQ(cm)        LLQ(cm)
 4.3           3.17          3
Biophysical Evaluation

 Amniotic F.V:   Pocket => 2 cm             F. Tone:        Observed
 F. Movement:    Observed                   Score:          [DATE]
 F. Breathing:   Observed
Biometry

 BPD:        87  mm     G. Age:  35w 1d         99  %    CI:        77.43   %    70 - 86
                                                         FL/HC:      20.0   %    19.1 -
 HC:       313   mm     G. Age:  35w 0d         91  %    HC/AC:      1.05        0.96 -
 AC:      299.4  mm     G. Age:  33w 6d         94  %    FL/BPD:     72.1   %    71 - 87
 FL:       62.7  mm     G. Age:  32w 3d         54  %    FL/AC:      20.9   %    20 - 24

 Est. FW:    6685  gm           5 lb     92  %
OB History

 Gravidity:    1         Term:   0        Prem:   0        SAB:   0
 TOP:          0       Ectopic:  0        Living: 0
Gestational Age

 LMP:           31w 6d        Date:  06/15/19                 EDD:   03/21/20
 U/S Today:     34w 1d                                        EDD:   03/05/20
 Best:          31w 6d     Det. By:  LMP  (06/15/19)          EDD:   03/21/20
Anatomy

 Cranium:               Appears normal         LVOT:                   Previously seen
 Cavum:                 Previously seen        Aortic Arch:            Previously seen
 Ventricles:            Appears normal         Ductal Arch:            Previously seen
 Choroid Plexus:        Previously seen        Diaphragm:              Appears normal
 Cerebellum:            Previously seen        Stomach:                Appears normal, left
                                                                       sided
 Posterior Fossa:       Previously seen        Abdomen:                Appears normal
 Nuchal Fold:           Previously seen        Abdominal Wall:         Previously seen
 Face:                  Orbits and profile     Cord Vessels:           Previously seen
                        previously seen
 Lips:                  Previously seen        Kidneys:                Appear normal
 Palate:                Previously seen        Bladder:                Appears normal
 Thoracic:              Appears normal         Spine:                  Previously seen
 Heart:                 Appears normal         Upper Extremities:      Previously seen
                        (4CH, axis, and
                        situs)
 RVOT:                  Previously seen        Lower Extremities:      Previously seen

 Other:  Open hands/5th digits,  Heels, and Nasal bone previously visualized.
Cervix Uterus Adnexa

 Cervix
 Not visualized (advanced GA >11wks)
Impression

 Patient has a diagnosis of gestational hypertension.  She
 does not have gestational diabetes.  Blood pressures today
 at our office is 147/85 mmHg.  She does not have symptoms
 of headache or visual disturbances or right upper quadrant
 pain or vaginal bleeding.

 On today's ultrasound, amniotic fluid is normal good fetal
 activity seen.  The estimated fetal weight is at the 92nd
 percentile.Antenatal testing is reassuring. BPP [DATE].

 I have reassured the patient of the findings and discussed the
 importance of weekly antenatal testing.  Patient has her
 prenatal visit appointments at [HOSPITAL] and will probably
 be getting weekly NSTs.
 [facility] I recommend weekly NST and modified
 BPP (check amniotic fluid).
Recommendations

 Appointments were made for weekly BPP at our center.
                 Sazdov, Krumi

## 2021-06-02 NOTE — L&D Delivery Note (Addendum)
OB/GYN Faculty Practice Delivery Note  Carla Hall is a 26 y.o. G2P1001 at [redacted]w[redacted]d admitted for IOL for cHTN managed on Labetalol.   GBS Status: Negative/-- (10/31 0000) Maximum Maternal Temperature: 99.3  Labor course: Initial SVE: 4.5 cm. Augmentation with: AROM, Pitocin, and OP Foley. She then progressed to complete.  ROM: 5h 69m with clear fluid  Birth: At 1647 a viable female was delivered via spontaneous vaginal delivery (Presentation: LOA). Nuchal cord present: No.  Shoulders and body delivered in usual fashion. Infant placed directly on mom's abdomen for bonding/skin-to-skin, baby dried and stimulated. Cord clamped x 2 after 1 minute and cut by FOB.  Cord blood collected.  The placenta separated spontaneously and delivered via gentle cord traction.  Pitocin infused rapidly IV per protocol.  Fundus firm with massage.  Placenta inspected and appears to be intact with a 3 VC.  Placenta/Cord with the following complications: none .  Cord pH: n/a Sponge and instrument count were correct x2.  Intrapartum complications:  None Anesthesia:  epidural Episiotomy: none Lacerations:  none Suture Repair:  n/a EBL (mL): 153   Infant: APGAR (1 MIN): 10 APGAR (5 MINS): 10 Infant weight: pending  Mom to postpartum.  Baby to Couplet care / Skin to Skin. Placenta to L&D   Plans to Breastfeed Contraception: condoms Circumcision: N/A  Note sent to [facility]: KV for pp visit.  Laury Deep , MSN, CNM 04/08/2022 5:27 PM

## 2021-06-28 ENCOUNTER — Ambulatory Visit (INDEPENDENT_AMBULATORY_CARE_PROVIDER_SITE_OTHER): Payer: 59 | Admitting: Certified Nurse Midwife

## 2021-06-28 ENCOUNTER — Other Ambulatory Visit (HOSPITAL_COMMUNITY)
Admission: RE | Admit: 2021-06-28 | Discharge: 2021-06-28 | Disposition: A | Discharge status: Home / Self Care | Payer: 59 | Source: Ambulatory Visit | Attending: Certified Nurse Midwife | Admitting: Certified Nurse Midwife

## 2021-06-28 ENCOUNTER — Other Ambulatory Visit: Payer: Self-pay

## 2021-06-28 ENCOUNTER — Encounter: Payer: Self-pay | Admitting: Certified Nurse Midwife

## 2021-06-28 VITALS — BP 122/78 | HR 68 | Resp 16 | Ht 69.0 in | Wt 224.0 lb

## 2021-06-28 DIAGNOSIS — Z01419 Encounter for gynecological examination (general) (routine) without abnormal findings: Secondary | ICD-10-CM

## 2021-06-28 DIAGNOSIS — Z3169 Encounter for other general counseling and advice on procreation: Secondary | ICD-10-CM

## 2021-06-29 IMAGING — US US MFM OB FOLLOW-UP
1 series · 14 of 23 positions shown · non-contrast
Comparison: none

[Series 1: us mfm ob follow-up · 23 acquisitions, 14 frames shown]
[im 1/23]
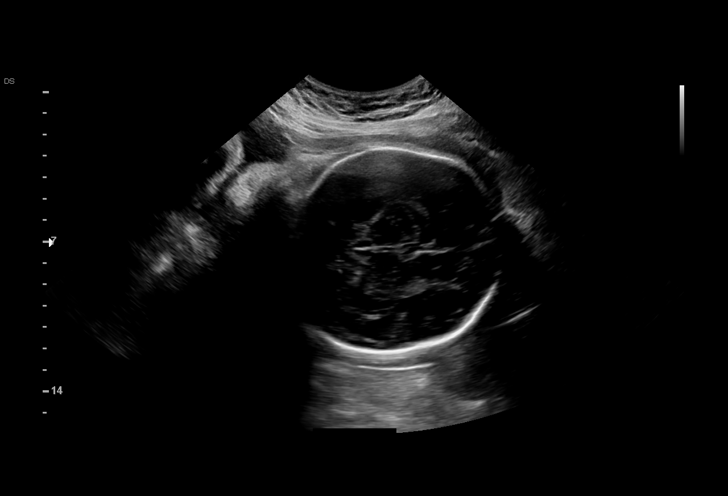
[im 3/23]
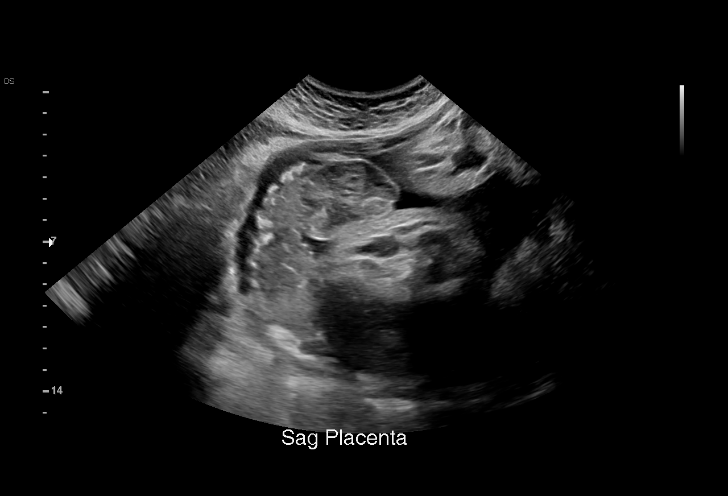
[im 5/23]
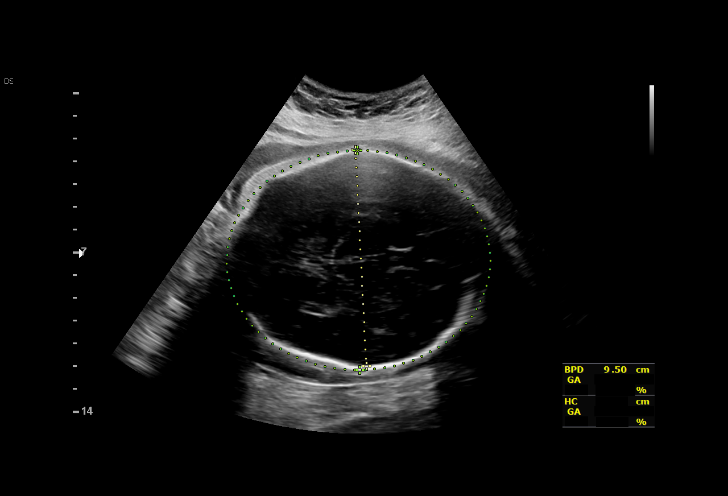
[im 6/23]
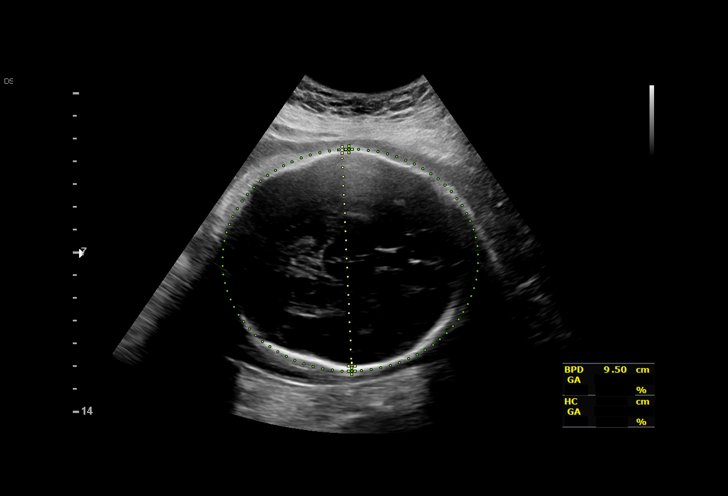
[im 8/23]
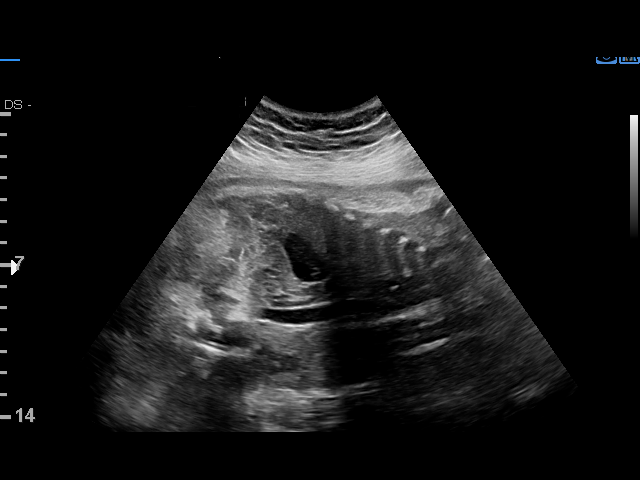
[im 10/23]
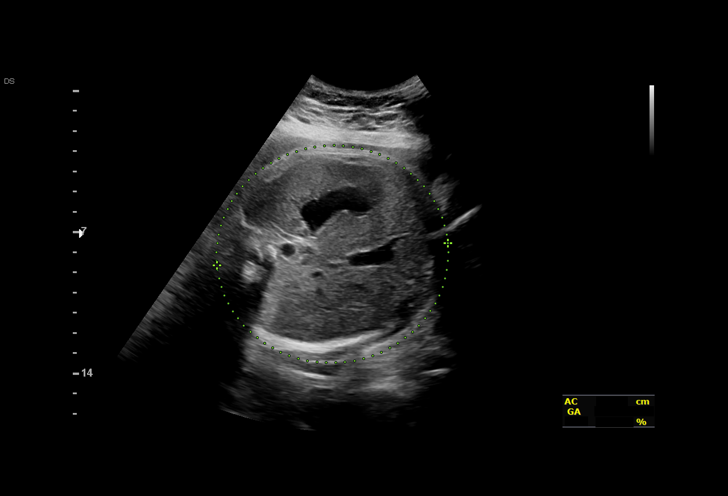
[im 11/23]
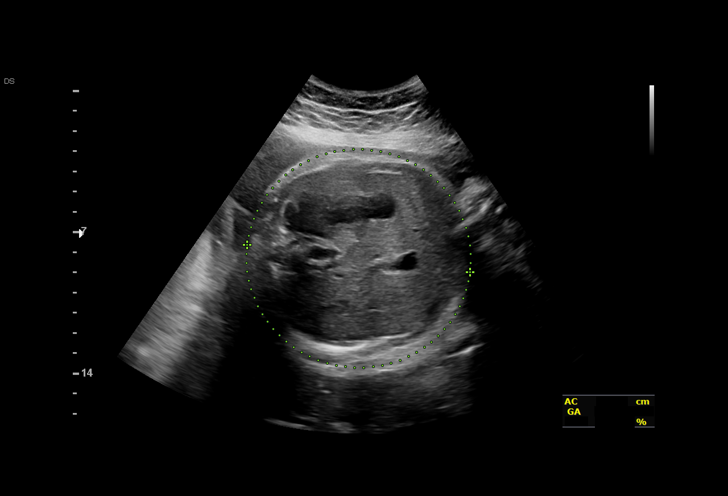
[im 13/23]
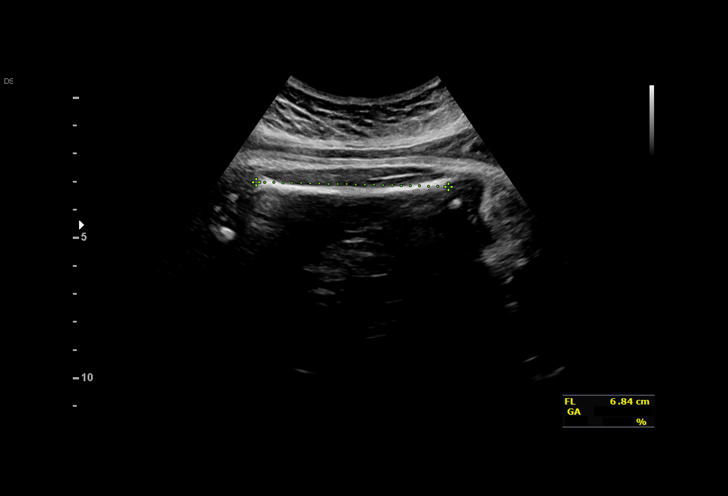
[im 14/23]
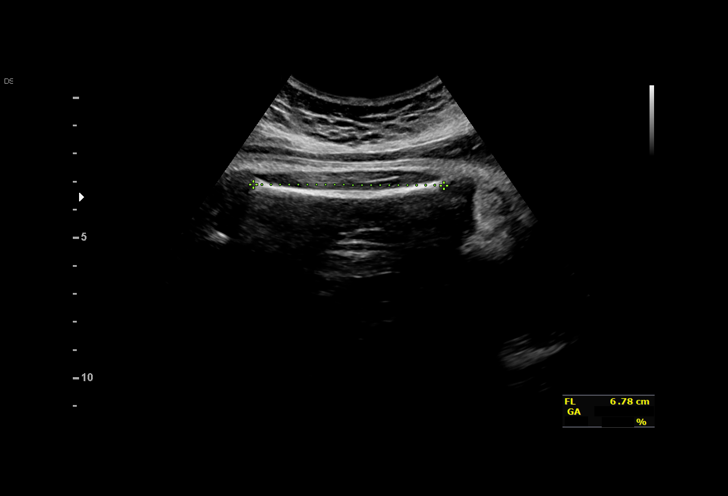
[im 16/23]
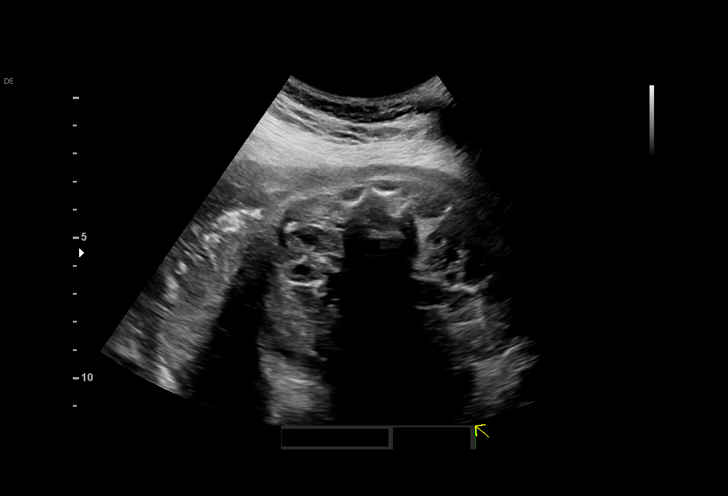
[im 18/23]
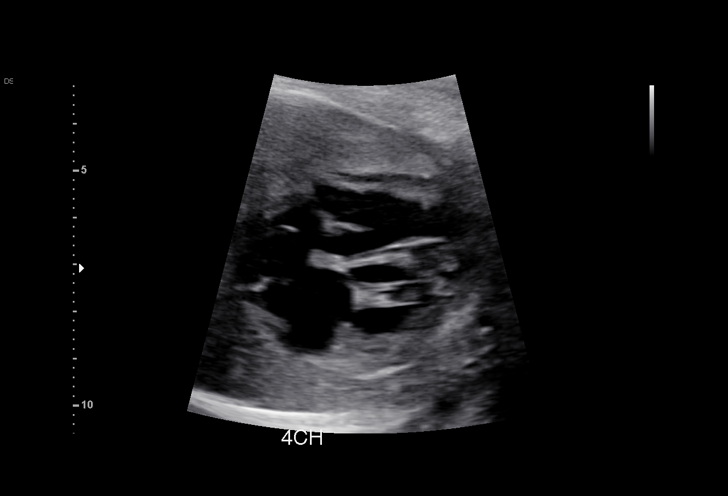
[im 19/23]
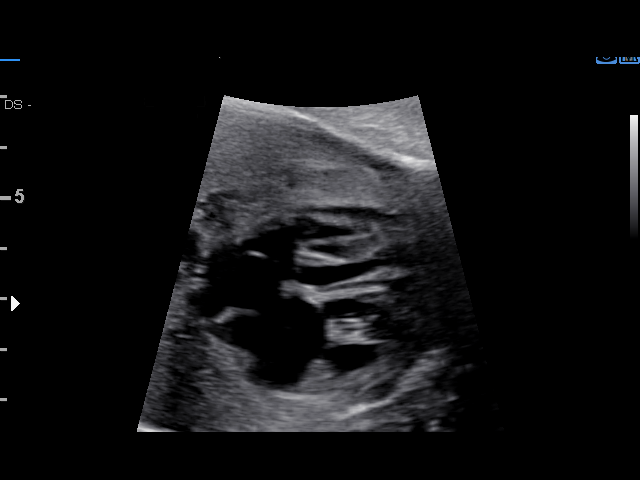
[im 21/23]
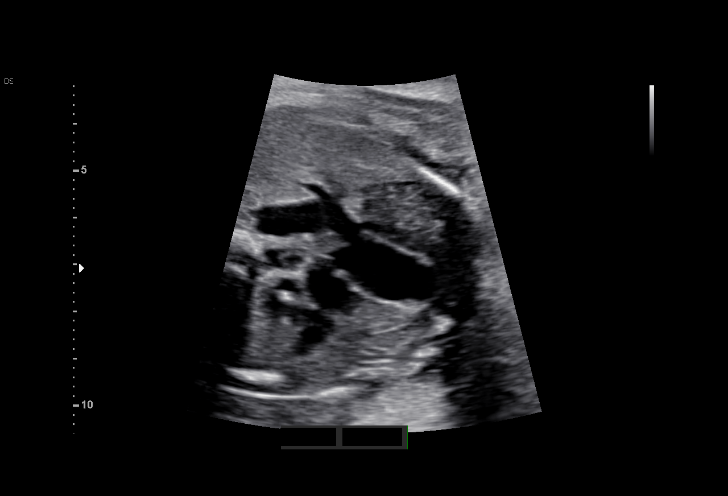
[im 23/23]
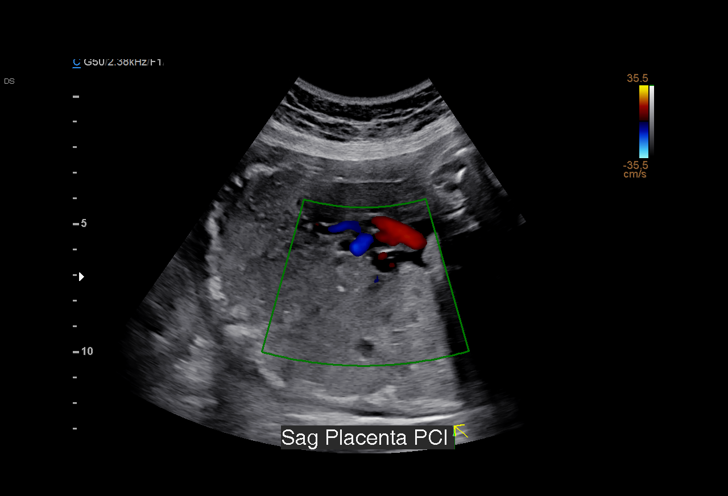

[14 of 23 positions shown; findings below may reference images not displayed]

Indications

 Gestational hypertension without significant
 proteinuria, third trimester (No Meds)
 Obesity complicating pregnancy, third
 trimester (Pregravid BMI 31)
 Low risk NIPS, Y.G11, neg Horizon, AFP
 pending
 Encounter for other antenatal screening
 follow-up
 35 weeks gestation of pregnancy
Fetal Evaluation

 Num Of Fetuses:         1
 Cardiac Activity:       Observed
 Presentation:           Cephalic
 Placenta:               Fundal
 P. Cord Insertion:      Visualized, central

 Amniotic Fluid
 AFI FV:      Within normal limits
Biophysical Evaluation

 Amniotic F.V:   Pocket => 2 cm             F. Tone:        Observed
 F. Movement:    Observed                   Score:          [DATE]
 F. Breathing:   Observed
Biometry

 BPD:      94.7  mm     G. Age:  38w 4d         99  %    CI:        80.44   %    70 - 86
                                                         FL/HC:      20.4   %    20.1 -
 HC:      333.5  mm     G. Age:  38w 1d         74  %    HC/AC:      0.97        0.93 -
 AC:      343.8  mm     G. Age:  38w 2d         98  %    FL/BPD:     71.8   %    71 - 87
 FL:         68  mm     G. Age:  35w 0d         22  %    FL/AC:      19.8   %    20 - 24

 Est. FW:    6465  gm      7 lb 2 oz     90  %
OB History

 Gravidity:    1         Term:   0        Prem:   0        SAB:   0
 TOP:          0       Ectopic:  0        Living: 0
Gestational Age

 LMP:           35w 6d        Date:  06/15/19                 EDD:   03/21/20
 U/S Today:     37w 4d                                        EDD:   03/09/20
 Best:          35w 6d     Det. By:  LMP  (06/15/19)          EDD:   03/21/20
Anatomy

 Cranium:               Appears normal         Heart:                  Appears normal
                                                                       (4CH, axis, and
                                                                       situs)
 Cavum:                 Appears normal         RVOT:                   Appears normal
 Ventricles:            Appears normal         LVOT:                   Appears normal
 Choroid Plexus:        Appears normal         Diaphragm:              Appears normal
 Cerebellum:            Appears normal         Stomach:                Appears normal, left
                                                                       sided
 Face:                  Appears normal         Cord Vessels:           Appears normal (3
                        (orbits and profile)                           vessel cord)
 Lips:                  Appears normal         Kidneys:                Appear normal
 Thoracic:              Appears normal         Bladder:                Appears normal

 Other:  Other anatomy previously imaged and appeared normal.
Cervix Uterus Adnexa

 Cervix
 Not visualized (advanced GA >37wks)
Impression

 Gestational hypertension. Patient has been having NSTs at
 your office. She does not have symptoms of severe features
 of preeclampsia. Blood pressure today at our office is 146/87
 mmHg.

 Amniotic fluid is normal and good fetal activity is seen .Fetal
 growth is appropriate for gestational age .Antenatal testing is
 reassuring. BPP [DATE].

 Patient will be undergoing induction of labor at 37 weeks.
Recommendations

 Follow-up scans as clinically indicated.
                 Zibit, Babamu

## 2021-06-30 ENCOUNTER — Emergency Department (INDEPENDENT_AMBULATORY_CARE_PROVIDER_SITE_OTHER)
Admission: EM | Admit: 2021-06-30 | Discharge: 2021-06-30 | Disposition: A | Discharge status: Home / Self Care | Payer: 59 | Source: Home / Self Care

## 2021-06-30 ENCOUNTER — Encounter: Payer: Self-pay | Admitting: Certified Nurse Midwife

## 2021-06-30 ENCOUNTER — Other Ambulatory Visit: Payer: Self-pay

## 2021-06-30 DIAGNOSIS — B349 Viral infection, unspecified: Secondary | ICD-10-CM | POA: Diagnosis not present

## 2021-06-30 LAB — POCT INFLUENZA A/B
Influenza A, POC: NEGATIVE
Influenza B, POC: NEGATIVE

## 2021-06-30 LAB — POC SARS CORONAVIRUS 2 AG -  ED: SARS Coronavirus 2 Ag: NEGATIVE

## 2021-06-30 NOTE — Progress Notes (Signed)
Gynecology Annual Exam   History of Present Illness: Carla Hall is a 26 y.o. married female presenting for an annual exam. She has no complaints today. She is sexually active. She denies dyspareunia. She does perform self breast exams. There is no notable family history of breast or ovarian cancer in her family. She is planning to attempt conception this year, possibly in the spring.   Past Medical History:  Past Medical History:  Diagnosis Date   H/O tympanostomy    History of COVID-19    Mild intermittent asthma 04/08/2018    Past Surgical History:  Past Surgical History:  Procedure Laterality Date   TYMPANOSTOMY TUBE PLACEMENT     WISDOM TOOTH EXTRACTION      Gynecologic History:  LMP: Patient's last menstrual period was 06/17/2021. Average Interval: regular Heavy Menses: no Clots: no Intermenstrual Bleeding: no Postcoital Bleeding: no Dysmenorrhea: no Contraception: none Last Pap: completed on 04/15/18 ; result was:  NILM   Mammogram: n/a  Obstetric History: G1P1001  Family History:  Family History  Problem Relation Age of Onset   Diabetes Mellitus I Sister     Social History:  Social History   Socioeconomic History   Marital status: Married    Spouse name: Not on file   Number of children: Not on file   Years of education: Not on file   Highest education level: Not on file  Occupational History   Occupation: Non Profit    Employer: [employer]  Tobacco Use   Smoking status: Never   Smokeless tobacco: Never  Vaping Use   Vaping Use: Never used  Substance and Sexual Activity   Alcohol use: Not Currently    Comment: 1/WK   Drug use: Never   Sexual activity: Yes    Partners: Male    Birth control/protection: Condom  Other Topics Concern   Not on file  Social History Narrative   Not on file   Social Determinants of Health   Financial Resource Strain: Not on file  Food Insecurity: Not on file  Transportation Needs: Not on  file  Physical Activity: Not on file  Stress: Not on file  Social Connections: Not on file  Intimate Partner Violence: Not on file    Allergies:  Allergies  Allergen Reactions   Penicillins Rash    Medications: Prior to Admission medications   Medication Sig Start Date End Date Taking? Authorizing Provider  albuterol (PROVENTIL HFA;VENTOLIN HFA) 108 (90 Base) MCG/ACT inhaler Inhale 2 puffs into the lungs every 6 (six) hours as needed for wheezing or shortness of breath. 04/08/18  Yes Rodolph Bong, MD    Review of Systems: negative except noted in HPI  Physical Exam Vitals: BP 122/78    Pulse 68    Resp 16    Ht 5\' 9"  (1.753 m)    Wt 224 lb (101.6 kg)    LMP 06/17/2021    Breastfeeding No    BMI 33.08 kg/m  General: NAD HEENT: normocephalic, atraumatic Thyroid: no enlargement, no palpable nodules Pulmonary: Normal rate and effort, CTAB Cardiovascular: RRR Breast: Breast symmetrical, no tenderness, no palpable nodules or masses, no skin or nipple retraction present, no nipple discharge. No axillary or supraclavicular lymphadenopathy. Abdomen: soft, non-tender, non-distended. No hepatomegaly, splenomegaly or masses palpable. No evidence of hernia  Genitourinary:  External: Normal external female genitalia. Normal urethral meatus  Vagina: Normal vaginal mucosa, no evidence of prolapse   Cervix: Grossly normal in appearance, no bleeding  Uterus: Non-enlarged, mobile, normal contour. No CMT  Adnexa: non-enlarged, no masses  Rectal: deferred Extremities: no edema, erythema, or tenderness Neurologic: Grossly intact Psychiatric: mood appropriate, affect full Female chaperone present for pelvic and breast portions of the physical exam  No results found for this or any previous visit (from the past 24 hour(s)).  Assessment:  1. Well woman exam   2. Encounter for preconception consultation     Plan: Papsmear today Discussed signs of fertile window, may use ovulation kits,  timed IC  EOD Eliminate alcohol, no drug use, good nutrition and exercise Continue PNV Follow up with OBGYN in 1 year or +HPT Follow up with PCP as planned  [name], CNM 06/30/2021 10:03 AM

## 2021-06-30 NOTE — ED Triage Notes (Signed)
Pt presents to Urgent Care with c/o headache, body aches, and fever since this AM. States she has intermittent dizziness and nausea, vomited x 1 today.

## 2021-06-30 NOTE — ED Provider Notes (Signed)
[facility]    CSN: [id number] Arrival date & time: 06/30/21  1544      History   Chief Complaint Chief Complaint  Patient presents with   Headache   Fever   Generalized Body Aches    HPI Carla Hall is a 26 y.o. female.   Pleasant 26 year old female presents today with an acute onset of headache, generalized body aches, fever which woke her from her sleep around 1:30 AM this morning.  She was able to get back to sleep after taking over-the-counter antipyretics, but states it woke her up again around 6:00 this morning.  She vomited 1 time this morning, but feels it was likely due to her headache.  She has a 36 and a 31-year-old child, both attending daycare.  However she states they are not sick, husband also is not sick.  She has not been around any additional sick contacts.  She states this morning when her symptoms started she had no fever, but it went up to 100.1 this afternoon.  She denies any abdominal pain, nausea, vomiting apart from that one episode.  No diarrhea.  She also denies photophobia or nuchal rigidity.   Headache Associated symptoms: fever   Fever Associated symptoms: headaches    Past Medical History:  Diagnosis Date   H/O tympanostomy    History of COVID-19    Mild intermittent asthma 04/08/2018    Patient Active Problem List   Diagnosis Date Noted   Mild intermittent asthma 04/08/2018    Past Surgical History:  Procedure Laterality Date   TYMPANOSTOMY TUBE PLACEMENT     WISDOM TOOTH EXTRACTION      OB History     Gravida  1   Para  1   Term  1   Preterm  0   AB  0   Living  1      SAB  0   IAB  0   Ectopic  0   Multiple  0   Live Births  1            Home Medications    Prior to Admission medications   Medication Sig Start Date End Date Taking? Authorizing Provider  ibuprofen (ADVIL) 400 MG tablet Take 400 mg by mouth every 6 (six) hours as needed.   Yes [provider]  naproxen sodium  (ALEVE) 220 MG tablet Take 220 mg by mouth.   Yes [provider]  albuterol (PROVENTIL HFA;VENTOLIN HFA) 108 (90 Base) MCG/ACT inhaler Inhale 2 puffs into the lungs every 6 (six) hours as needed for wheezing or shortness of breath. 04/08/18   Rodolph Bong, MD    Family History Family History  Problem Relation Age of Onset   Hypertension Mother    Healthy Father    Diabetes Mellitus I Sister     Social History Social History   Tobacco Use   Smoking status: Never   Smokeless tobacco: Never  Vaping Use   Vaping Use: Never used  Substance Use Topics   Alcohol use: Yes    Comment: occasionally   Drug use: Never     Allergies   Penicillins   Review of Systems Review of Systems  Constitutional:  Positive for fever.  Neurological:  Positive for headaches.  As per hpi  Physical Exam Triage Vital Signs ED Triage Vitals  Enc Vitals Group     BP 06/30/21 1620 104/67     Pulse Rate 06/30/21 1620 (!) 118  Resp 06/30/21 1620 20     Temp 06/30/21 1620 99.8 F (37.7 C)     Temp src --      SpO2 06/30/21 1620 100 %     Weight 06/30/21 1615 220 lb (99.8 kg)     Height 06/30/21 1615 5\' 9"  (1.753 m)     Head Circumference --      Peak Flow --      Pain Score 06/30/21 1615 4     Pain Loc --      Pain Edu? --      Excl. in GC? --    No data found.  Updated Vital Signs BP 104/67 (BP Location: Right Arm)    Pulse (!) 118    Temp 99.8 F (37.7 C)    Resp 20    Ht 5\' 9"  (1.753 m)    Wt 220 lb (99.8 kg)    LMP 06/17/2021 (Exact Date)    SpO2 100%    Breastfeeding No    BMI 32.49 kg/m   Visual Acuity Right Eye Distance:   Left Eye Distance:   Bilateral Distance:    Right Eye Near:   Left Eye Near:    Bilateral Near:     Physical Exam Vitals and nursing note reviewed.  Constitutional:      General: She is not in acute distress.    Appearance: She is well-developed. She is obese. She is ill-appearing. She is not toxic-appearing or diaphoretic.  HENT:      Head: Normocephalic and atraumatic.     Right Ear: Tympanic membrane, ear canal and external ear normal. There is no impacted cerumen.     Left Ear: Tympanic membrane, ear canal and external ear normal. There is no impacted cerumen.     Nose: Nose normal. No congestion or rhinorrhea.     Mouth/Throat:     Mouth: Mucous membranes are moist.     Pharynx: No oropharyngeal exudate or posterior oropharyngeal erythema.  Eyes:     General: No scleral icterus.       Right eye: No discharge.        Left eye: No discharge.     Extraocular Movements: Extraocular movements intact.     Right eye: Normal extraocular motion.     Left eye: Normal extraocular motion.     Conjunctiva/sclera: Conjunctivae normal.     Pupils: Pupils are equal, round, and reactive to light. Pupils are equal.  Neck:     Meningeal: Brudzinski's sign absent.  Cardiovascular:     Rate and Rhythm: Regular rhythm. Tachycardia present.     Pulses: Normal pulses.     Heart sounds: Normal heart sounds. No murmur heard.   No gallop.  Pulmonary:     Effort: Pulmonary effort is normal. No respiratory distress.     Breath sounds: Normal breath sounds. No stridor. No wheezing, rhonchi or rales.  Chest:     Chest wall: No tenderness.  Abdominal:     General: There is no distension.     Palpations: Abdomen is soft. There is no mass.     Tenderness: There is no guarding.  Musculoskeletal:        General: No swelling or tenderness. Normal range of motion.     Cervical back: Normal range of motion and neck supple. No rigidity or tenderness.     Right lower leg: No edema.     Left lower leg: No edema.  Lymphadenopathy:     Cervical: No cervical  adenopathy.  Skin:    General: Skin is warm and dry.     Capillary Refill: Capillary refill takes less than 2 seconds.  Neurological:     Mental Status: She is alert.     Cranial Nerves: No cranial nerve deficit.  Psychiatric:        Mood and Affect: Mood normal.        Behavior:  Behavior normal.        Cognition and Memory: Cognition is not impaired.     UC Treatments / Results  Labs (all labs ordered are listed, but only abnormal results are displayed) Labs Reviewed  POCT INFLUENZA A/B  POC SARS CORONAVIRUS 2 AG -  ED    EKG   Radiology No results found.  Procedures Procedures (including critical care time)  Medications Ordered in UC Medications - No data to display  Initial Impression / Assessment and Plan / UC Course  I have reviewed the triage vital signs and the nursing notes.  Pertinent labs & imaging results that were available during my care of the patient were reviewed by me and considered in my medical decision making (see chart for details).     Viral syndrome - supportive care. Flu and covid testing negative. Possibly adenovirus vs parainfluenza.  Over-the-counter Oscillococcinum, rest, hydration, over-the-counter antipyretics.  Timeframe to sx resolution discussed  Final Clinical Impressions(s) / UC Diagnoses   Final diagnoses:  Viral syndrome     Discharge Instructions      Alternate ibuprofen 800 mg every 4 hours with Tylenol 1000 mg.  Do not exceed 2400 mg of ibuprofen or 3000 mg of Tylenol daily. Rest and hydrate. Frequent handwashing. Purchase over-the-counter Oscillococcinum to help with body aches and viral symptoms. Your flu and covid tests were negative.    ED Prescriptions   None    PDMP not reviewed this encounter.   Maretta BeesCrain, Benjermin Korber L, [place] 06/30/21 1657

## 2021-06-30 NOTE — Discharge Instructions (Addendum)
Alternate ibuprofen 800 mg every 4 hours with Tylenol 1000 mg.  Do not exceed 2400 mg of ibuprofen or 3000 mg of Tylenol daily. Rest and hydrate. Frequent handwashing. Purchase over-the-counter Oscillococcinum to help with body aches and viral symptoms. Your flu and covid tests were negative.

## 2021-07-02 LAB — CYTOLOGY - PAP: Diagnosis: NEGATIVE

## 2021-09-30 ENCOUNTER — Encounter: Payer: Self-pay | Admitting: *Deleted

## 2021-10-01 ENCOUNTER — Ambulatory Visit (INDEPENDENT_AMBULATORY_CARE_PROVIDER_SITE_OTHER): Payer: 59 | Admitting: Certified Nurse Midwife

## 2021-10-01 ENCOUNTER — Other Ambulatory Visit (HOSPITAL_COMMUNITY)
Admission: RE | Admit: 2021-10-01 | Discharge: 2021-10-01 | Disposition: A | Discharge status: Home / Self Care | Payer: 59 | Source: Ambulatory Visit | Attending: Certified Nurse Midwife | Admitting: Certified Nurse Midwife

## 2021-10-01 VITALS — BP 125/87 | HR 91 | Wt 240.0 lb

## 2021-10-01 DIAGNOSIS — Z8759 Personal history of other complications of pregnancy, childbirth and the puerperium: Secondary | ICD-10-CM | POA: Diagnosis not present

## 2021-10-01 DIAGNOSIS — Z3480 Encounter for supervision of other normal pregnancy, unspecified trimester: Secondary | ICD-10-CM | POA: Diagnosis present

## 2021-10-01 DIAGNOSIS — O99211 Obesity complicating pregnancy, first trimester: Secondary | ICD-10-CM | POA: Diagnosis not present

## 2021-10-01 DIAGNOSIS — Z3A10 10 weeks gestation of pregnancy: Secondary | ICD-10-CM | POA: Diagnosis not present

## 2021-10-01 DIAGNOSIS — O99210 Obesity complicating pregnancy, unspecified trimester: Secondary | ICD-10-CM | POA: Insufficient documentation

## 2021-10-01 LAB — HEPATITIS C ANTIBODY: HCV Ab: NEGATIVE

## 2021-10-01 MED ORDER — ASPIRIN 81 MG PO CHEW: 81.0000 mg | CHEWABLE_TABLET | Freq: Every day | ORAL | 3 refills | Status: DC

## 2021-10-01 NOTE — Progress Notes (Signed)
Bedside U/S shows single active IUP with FHT of 167 BPM and CRL 31.60mm  GA 10w ?

## 2021-10-01 NOTE — Progress Notes (Signed)
?  ? ?Subjective:  ? ?Carla Hall is a 26 y.o. G2P1001 at [redacted]w[redacted]d by LMP, early ultrasound being seen today for her first obstetrical visit.  Her obstetrical history is significant for pre-eclampsia. Patient does intend to breast feed. Pregnancy history fully reviewed. ? ?Patient reports nausea, she's using Unisom and B6 ? ?HISTORY: ?OB History  ?Gravida Para Term Preterm AB Living  ?2 1 1  0 0 1  ?SAB IAB Ectopic Multiple Live Births  ?0 0 0 0 1  ?  ?# Outcome Date GA Lbr Len/2nd Weight Sex Delivery Anes PTL Lv  ?2 Current           ?1 Term 03/02/20 [redacted]w[redacted]d / 02:01 7 lb 10.6 oz (3.476 kg) M Vag-Spont EPI  LIV  ?   Name: YAHAYRA, SAINTVIL  ?   Apgar1: 8  Apgar5: 9  ? ?Past Medical History:  ?Diagnosis Date  ? H/O tympanostomy   ? History of COVID-19   ? Mild intermittent asthma 04/08/2018  ? ?Past Surgical History:  ?Procedure Laterality Date  ? TYMPANOSTOMY TUBE PLACEMENT    ? WISDOM TOOTH EXTRACTION    ? ?Family History  ?Problem Relation Age of Onset  ? Hypertension Mother   ? Healthy Father   ? Diabetes Mellitus I Sister   ? ?Social History  ? ?Tobacco Use  ? Smoking status: Never  ? Smokeless tobacco: Never  ?Vaping Use  ? Vaping Use: Never used  ?Substance Use Topics  ? Alcohol use: Yes  ?  Comment: occasionally  ? Drug use: Never  ? ?Allergies  ?Allergen Reactions  ? Penicillins Rash  ? ?Current Outpatient Medications on File Prior to Visit  ?Medication Sig Dispense Refill  ? doxylamine, Sleep, (UNISOM) 25 MG tablet Take 25 mg by mouth at bedtime as needed.    ? Prenatal Vit-Fe Fumarate-FA (PRENATAL VITAMIN PO) Take by mouth.    ? pyridOXINE (VITAMIN B-6) 100 MG tablet Take 100 mg by mouth daily.    ? albuterol (PROVENTIL HFA;VENTOLIN HFA) 108 (90 Base) MCG/ACT inhaler Inhale 2 puffs into the lungs every 6 (six) hours as needed for wheezing or shortness of breath. (Patient not taking: Reported on 10/01/2021) 1 Inhaler 0  ? ?No current facility-administered medications on file prior to visit.  ? ? ? Indications  for ASA therapy (per uptodate) ?One of the following: ?Previous pregnancy with preeclampsia, especially early onset and with an adverse outcome Yes ?Multifetal gestation No ?Chronic hypertension No ?Type 1 or 2 diabetes mellitus No ?Chronic kidney disease No ?Autoimmune disease (antiphospholipid syndrome, systemic lupus erythematosus) No ? ? Two or more of the following: ?Nulliparity No ?Obesity (body mass index >30 kg/m2) Yes ?Family history of preeclampsia in mother or sister No ?Age ?35 years No ?Sociodemographic characteristics (African American race, low socioeconomic level) No ?Personal risk factors (eg, previous pregnancy with low birth weight or small for gestational age infant, previous adverse pregnancy outcome [eg, stillbirth], interval >10 years between pregnancies) No ? ? Indications for early 1 hour GTT (per uptodate) ? ?BMI >25 (>23 in Asian women) AND one of the following ? ?Gestational diabetes mellitus in a previous pregnancy No ?Glycated hemoglobin ?5.7 percent (39 mmol/mol), impaired glucose tolerance, or impaired fasting glucose on previous testing No ?First-degree relative with diabetes No ?High-risk race/ethnicity (eg, African American, Latino, Native American, [place] American, [place] Islander) No ?History of cardiovascular disease No ?Hypertension or on therapy for hypertension No ?High-density lipoprotein cholesterol level <35 mg/dL (7.68 mmol/L) and/or a triglyceride level >  250 mg/dL (2.82 mmol/L) No ?Polycystic ovary syndrome No ?Physical inactivity No ?Other clinical condition associated with insulin resistance (eg, severe obesity, acanthosis nigricans) No ?Previous birth of an infant weighing ?4000 g No ?Previous stillbirth of unknown cause No ? ? ?Exam  ? ?Vitals:  ? 10/01/21 0814  ?BP: 125/87  ?Pulse: 91  ?Weight: 240 lb (108.9 kg)  ? ?Fetal Heart Rate (bpm): 167 ? ?Uterus:     ?Pelvic Exam: deferred   ?    ?    ?    ?    ?    ?System: General: well-developed, well-nourished female in  no acute distress  ?    ? Skin: normal coloration and turgor, no rashes  ? Neurologic: oriented, normal, negative, normal mood  ? Extremities: normal strength, tone, and muscle mass, ROM of all joints is normal  ? HEENT PERRLA, extraocular movement intact and sclera clear, anicteric  ? Mouth/Teeth mucous membranes moist, pharynx normal without lesions and dental hygiene good  ? Neck supple and no masses  ? Cardiovascular: regular rate and rhythm  ? Respiratory:  no respiratory distress, normal breath sounds  ?    ? ?  ?Assessment:  ? ?Pregnancy: G2P1001 ?Patient Active Problem List  ? Diagnosis Date Noted  ? Supervision of other normal pregnancy, antepartum 10/01/2021  ? History of severe pre-eclampsia 10/01/2021  ? Obesity affecting pregnancy 10/01/2021  ? Mild intermittent asthma 04/08/2018  ? ?  ?Plan:  ?1. Supervision of other normal pregnancy, antepartum ? ?- Obstetric panel ?- HIV antibody (with reflex) ?- Hepatitis C Antibody ?- Culture, OB Urine ?- Babyscripts Schedule Optimization ?- GC/Chlamydia probe amp ([place])not at [facility] ? ?2. History of severe pre-eclampsia ? ?- Comprehensive metabolic panel ?- Protein / creatinine ratio, urine ? ?3. Obesity affecting pregnancy in first trimester ? ?- HgB A1c ? ?4. [redacted] weeks gestation of pregnancy ? ? ? ?Initial labs drawn. ?Early 1 hour GTT >A1C ?Started on ASA yes ?Flu vax n/a ?Continue prenatal vitamins. ?Genetic Screening discussed, First trimester screen, Quad screen, and NIPS: requested. ?Ultrasound discussed; fetal anatomic survey: requested. ?Problem list reviewed and updated ?The nature of [place] for [facility] with multiple MDs and other Advanced Practice Providers was explained to patient; also emphasized that fellows, residents, and students are part of our team. ?Routine obstetric precautions reviewed ?Return in about 6 weeks (around 11/12/2021). ? ? ?Julianne Handler ?9:15 AM 10/01/21  ? ?

## 2021-10-02 LAB — PROTEIN / CREATININE RATIO, URINE
Creatinine, Urine: 330 mg/dL — ABNORMAL HIGH (ref 20–275)
Protein/Creat Ratio: 91 mg/g creat (ref 24–184)
Protein/Creatinine Ratio: 0.091 mg/mg creat (ref 0.024–0.184)
Total Protein, Urine: 30 mg/dL — ABNORMAL HIGH (ref 5–24)

## 2021-10-02 LAB — GC/CHLAMYDIA PROBE AMP (~~LOC~~) NOT AT ARMC
Chlamydia: NEGATIVE
Comment: NEGATIVE
Comment: NORMAL
Neisseria Gonorrhea: NEGATIVE

## 2021-10-03 LAB — COMPREHENSIVE METABOLIC PANEL
AG Ratio: 1.6 (calc) (ref 1.0–2.5)
ALT: 12 U/L (ref 6–29)
AST: 15 U/L (ref 10–30)
Albumin: 4.1 g/dL (ref 3.6–5.1)
Alkaline phosphatase (APISO): 46 U/L (ref 31–125)
BUN: 10 mg/dL (ref 7–25)
CO2: 23 mmol/L (ref 20–32)
Calcium: 9.4 mg/dL (ref 8.6–10.2)
Chloride: 105 mmol/L (ref 98–110)
Creat: 0.57 mg/dL (ref 0.50–0.96)
Globulin: 2.6 g/dL (calc) (ref 1.9–3.7)
Glucose, Bld: 76 mg/dL (ref 65–139)
Potassium: 4.3 mmol/L (ref 3.5–5.3)
Sodium: 136 mmol/L (ref 135–146)
Total Bilirubin: 0.4 mg/dL (ref 0.2–1.2)
Total Protein: 6.7 g/dL (ref 6.1–8.1)

## 2021-10-03 LAB — HEPATITIS C ANTIBODY
Hepatitis C Ab: NONREACTIVE
SIGNAL TO CUT-OFF: 0.06 (ref <1.00)

## 2021-10-03 LAB — OBSTETRIC PANEL
Absolute Monocytes: 319 cells/uL (ref 200–950)
Antibody Screen: NOT DETECTED
Basophils Absolute: 36 cells/uL (ref 0–200)
Basophils Relative: 0.4 %
Eosinophils Absolute: 73 cells/uL (ref 15–500)
Eosinophils Relative: 0.8 %
HCT: 39.3 % (ref 35.0–45.0)
Hemoglobin: 12.9 g/dL (ref 11.7–15.5)
Hepatitis B Surface Ag: NONREACTIVE
Lymphs Abs: 2129 cells/uL (ref 850–3900)
MCH: 28.8 pg (ref 27.0–33.0)
MCHC: 32.8 g/dL (ref 32.0–36.0)
MCV: 87.7 fL (ref 80.0–100.0)
MPV: 9.9 fL (ref 7.5–12.5)
Monocytes Relative: 3.5 %
Neutro Abs: 6543 cells/uL (ref 1500–7800)
Neutrophils Relative %: 71.9 %
Platelets: 289 10*3/uL (ref 140–400)
RBC: 4.48 10*6/uL (ref 3.80–5.10)
RDW: 13.1 % (ref 11.0–15.0)
RPR Ser Ql: NONREACTIVE
Rubella: 4.77 Index
Total Lymphocyte: 23.4 %
WBC: 9.1 10*3/uL (ref 3.8–10.8)

## 2021-10-03 LAB — URINE CULTURE, OB REFLEX

## 2021-10-03 LAB — HEMOGLOBIN A1C
Hgb A1c MFr Bld: 4.6 % of total Hgb (ref <5.7)
Mean Plasma Glucose: 85 mg/dL
eAG (mmol/L): 4.7 mmol/L

## 2021-10-03 LAB — HIV ANTIBODY (ROUTINE TESTING W REFLEX): HIV 1&2 Ab, 4th Generation: NONREACTIVE

## 2021-10-03 LAB — CULTURE, OB URINE

## 2021-10-15 ENCOUNTER — Ambulatory Visit (INDEPENDENT_AMBULATORY_CARE_PROVIDER_SITE_OTHER): Payer: 59 | Admitting: Obstetrics and Gynecology

## 2021-10-15 VITALS — BP 136/85 | HR 92 | Wt 240.1 lb

## 2021-10-15 DIAGNOSIS — Z8759 Personal history of other complications of pregnancy, childbirth and the puerperium: Secondary | ICD-10-CM

## 2021-10-15 DIAGNOSIS — O99211 Obesity complicating pregnancy, first trimester: Secondary | ICD-10-CM

## 2021-10-15 DIAGNOSIS — Z3480 Encounter for supervision of other normal pregnancy, unspecified trimester: Secondary | ICD-10-CM

## 2021-10-15 NOTE — Progress Notes (Signed)
? ?  PRENATAL VISIT NOTE ? ?Subjective:  ?Carla Hall is a 26 y.o. G2P1001 at [redacted]w[redacted]d being seen today for ongoing prenatal care.  She is currently monitored for the following issues for this low-risk pregnancy and has Mild intermittent asthma; Supervision of other normal pregnancy, antepartum; History of severe pre-eclampsia; and Obesity affecting pregnancy on their problem list. ? ?Patient reports no complaints.  Contractions: Not present. Vag. Bleeding: None.   . Denies leaking of fluid.  ? ?The following portions of the patient's history were reviewed and updated as appropriate: allergies, current medications, past family history, past medical history, past social history, past surgical history and problem list.  ? ?Objective:  ? ?Vitals:  ? 10/15/21 1515  ?BP: 136/85  ?Pulse: 92  ?Weight: 240 lb 1.9 oz (108.9 kg)  ? ? ?Fetal Status: Fetal Heart Rate (bpm): 154        ? ?General:  Alert, oriented and cooperative. Patient is in no acute distress.  ?Skin: Skin is warm and dry. No rash noted.   ?Cardiovascular: Normal heart rate noted  ?Respiratory: Normal respiratory effort, no problems with respiration noted  ?Abdomen: Soft, gravid, appropriate for gestational age.  Pain/Pressure: Absent     ?Pelvic: Cervical exam deferred        ?Extremities: Normal range of motion.  Edema: None  ?Mental Status: Normal mood and affect. Normal behavior. Normal judgment and thought content.  ? ?Assessment and Plan:  ?Pregnancy: G2P1001 at [redacted]w[redacted]d ? ?1. Supervision of other normal pregnancy, antepartum ? ?Doing well ?AFP next visit. ?Will continue to watch BP and treat if continues to rise.  ?Baseline labs done.  ? ?2. History of severe pre-eclampsia ? ?BASA  ? ?3. Obesity affecting pregnancy in first trimester ? ?Preterm labor symptoms and general obstetric precautions including but not limited to vaginal bleeding, contractions, leaking of fluid and fetal movement were reviewed in detail with the patient. ?Please refer to After  Visit Summary for other counseling recommendations.  ? ?No follow-ups on file. ? ?Future Appointments  ?Date Time Provider Department Center  ?11/12/2021  8:10 AM Armando Reichert, CNM [facility]  ? ? ?Venia Carbon, NP  ?

## 2021-11-06 ENCOUNTER — Other Ambulatory Visit: Payer: Self-pay | Admitting: *Deleted

## 2021-11-06 DIAGNOSIS — Z3480 Encounter for supervision of other normal pregnancy, unspecified trimester: Secondary | ICD-10-CM

## 2021-11-12 ENCOUNTER — Ambulatory Visit (INDEPENDENT_AMBULATORY_CARE_PROVIDER_SITE_OTHER): Payer: 59 | Admitting: Advanced Practice Midwife

## 2021-11-12 ENCOUNTER — Encounter: Payer: Self-pay | Admitting: Advanced Practice Midwife

## 2021-11-12 VITALS — BP 125/82 | Wt 243.0 lb

## 2021-11-12 DIAGNOSIS — Z3A16 16 weeks gestation of pregnancy: Secondary | ICD-10-CM

## 2021-11-12 DIAGNOSIS — Z3480 Encounter for supervision of other normal pregnancy, unspecified trimester: Secondary | ICD-10-CM

## 2021-11-12 MED ORDER — ONDANSETRON 4 MG PO TBDP: 4.0000 mg | ORAL_TABLET | Freq: Three times a day (TID) | ORAL | 3 refills | Status: DC | PRN

## 2021-11-12 NOTE — Progress Notes (Signed)
   PRENATAL VISIT NOTE  Subjective:  Carla Hall is a 26 y.o. G2P1001 at [redacted]w[redacted]d being seen today for ongoing prenatal care.  She is currently monitored for the following issues for this low-risk pregnancy and has Mild intermittent asthma; Supervision of other normal pregnancy, antepartum; History of severe pre-eclampsia; and Obesity affecting pregnancy on their problem list.  Patient reports no complaints.  Contractions: Not present. Vag. Bleeding: None.  Movement: Absent. Denies leaking of fluid.   The following portions of the patient's history were reviewed and updated as appropriate: allergies, current medications, past family history, past medical history, past social history, past surgical history and problem list.   Objective:   Vitals:   11/12/21 0805  BP: 125/82  Weight: 243 lb (110.2 kg)    Fetal Status: Fetal Heart Rate (bpm): 150 Fundal Height: 16 cm Movement: Absent     General:  Alert, oriented and cooperative. Patient is in no acute distress.  Skin: Skin is warm and dry. No rash noted.   Cardiovascular: Normal heart rate noted  Respiratory: Normal respiratory effort, no problems with respiration noted  Abdomen: Soft, gravid, appropriate for gestational age.  Pain/Pressure: Absent     Pelvic: Cervical exam deferred        Extremities: Normal range of motion.  Edema: None  Mental Status: Normal mood and affect. Normal behavior. Normal judgment and thought content.   Assessment and Plan:  Pregnancy: G2P1001 at [redacted]w[redacted]d 1. Supervision of other normal pregnancy, antepartum - Patient declines AFP today   2. [redacted] weeks gestation of pregnancy   Preterm labor symptoms and general obstetric precautions including but not limited to vaginal bleeding, contractions, leaking of fluid and fetal movement were reviewed in detail with the patient. Please refer to After Visit Summary for other counseling recommendations.   Return in about 4 weeks (around 12/10/2021).  Future  Appointments  Date Time Provider Department Center  12/04/2021  8:15 AM [facility] NURSE [facility]  12/04/2021  8:30 AM [facility] US3 [facility]  12/10/2021  8:50 AM Brand Males, CNM [facility]   Thressa Sheller DNP, CNM  11/12/21  8:52 AM

## 2021-12-02 ENCOUNTER — Other Ambulatory Visit: Payer: 59

## 2021-12-02 ENCOUNTER — Ambulatory Visit: Payer: 59

## 2021-12-04 ENCOUNTER — Ambulatory Visit: Payer: 59 | Attending: Obstetrics and Gynecology | Admitting: *Deleted

## 2021-12-04 ENCOUNTER — Ambulatory Visit (HOSPITAL_BASED_OUTPATIENT_CLINIC_OR_DEPARTMENT_OTHER): Payer: 59

## 2021-12-04 ENCOUNTER — Other Ambulatory Visit: Payer: Self-pay | Admitting: *Deleted

## 2021-12-04 ENCOUNTER — Encounter: Payer: Self-pay | Admitting: *Deleted

## 2021-12-04 VITALS — BP 132/75 | HR 97

## 2021-12-04 DIAGNOSIS — Z3A19 19 weeks gestation of pregnancy: Secondary | ICD-10-CM | POA: Insufficient documentation

## 2021-12-04 DIAGNOSIS — O09292 Supervision of pregnancy with other poor reproductive or obstetric history, second trimester: Secondary | ICD-10-CM | POA: Diagnosis present

## 2021-12-04 DIAGNOSIS — O99211 Obesity complicating pregnancy, first trimester: Secondary | ICD-10-CM

## 2021-12-04 DIAGNOSIS — Z3689 Encounter for other specified antenatal screening: Secondary | ICD-10-CM

## 2021-12-04 DIAGNOSIS — O132 Gestational [pregnancy-induced] hypertension without significant proteinuria, second trimester: Secondary | ICD-10-CM | POA: Insufficient documentation

## 2021-12-04 DIAGNOSIS — Z8759 Personal history of other complications of pregnancy, childbirth and the puerperium: Secondary | ICD-10-CM | POA: Diagnosis not present

## 2021-12-04 DIAGNOSIS — O1492 Unspecified pre-eclampsia, second trimester: Secondary | ICD-10-CM | POA: Diagnosis not present

## 2021-12-04 DIAGNOSIS — O09299 Supervision of pregnancy with other poor reproductive or obstetric history, unspecified trimester: Secondary | ICD-10-CM

## 2021-12-04 DIAGNOSIS — E669 Obesity, unspecified: Secondary | ICD-10-CM | POA: Insufficient documentation

## 2021-12-04 DIAGNOSIS — Z362 Encounter for other antenatal screening follow-up: Secondary | ICD-10-CM

## 2021-12-04 DIAGNOSIS — Z3480 Encounter for supervision of other normal pregnancy, unspecified trimester: Secondary | ICD-10-CM

## 2021-12-04 DIAGNOSIS — Z6835 Body mass index (BMI) 35.0-35.9, adult: Secondary | ICD-10-CM

## 2021-12-04 DIAGNOSIS — O99212 Obesity complicating pregnancy, second trimester: Secondary | ICD-10-CM | POA: Insufficient documentation

## 2021-12-10 ENCOUNTER — Ambulatory Visit (INDEPENDENT_AMBULATORY_CARE_PROVIDER_SITE_OTHER): Payer: 59

## 2021-12-10 VITALS — BP 138/83 | HR 86 | Wt 249.0 lb

## 2021-12-10 DIAGNOSIS — Z8759 Personal history of other complications of pregnancy, childbirth and the puerperium: Secondary | ICD-10-CM

## 2021-12-10 DIAGNOSIS — Z3480 Encounter for supervision of other normal pregnancy, unspecified trimester: Secondary | ICD-10-CM

## 2021-12-10 DIAGNOSIS — Z3A20 20 weeks gestation of pregnancy: Secondary | ICD-10-CM

## 2021-12-10 DIAGNOSIS — O99211 Obesity complicating pregnancy, first trimester: Secondary | ICD-10-CM

## 2021-12-10 NOTE — Progress Notes (Signed)
   PRENATAL VISIT NOTE  Subjective:  Carla Hall is a 26 y.o. G2P1001 at [redacted]w[redacted]d being seen today for ongoing prenatal care.  She is currently monitored for the following issues for this low-risk pregnancy and has Mild intermittent asthma; Supervision of other normal pregnancy, antepartum; History of severe pre-eclampsia; and Obesity affecting pregnancy on their problem list.  Patient reports no complaints.  Contractions: Not present. Vag. Bleeding: None.  Movement: Present. Denies leaking of fluid.   The following portions of the patient's history were reviewed and updated as appropriate: allergies, current medications, past family history, past medical history, past social history, past surgical history and problem list.   Objective:   Vitals:   12/10/21 0846  BP: 138/83  Pulse: 86  Weight: 249 lb (112.9 kg)    Fetal Status: Fetal Heart Rate (bpm): 154   Movement: Present     General:  Alert, oriented and cooperative. Patient is in no acute distress.  Skin: Skin is warm and dry. No rash noted.   Cardiovascular: Normal heart rate noted  Respiratory: Normal respiratory effort, no problems with respiration noted  Abdomen: Soft, gravid, appropriate for gestational age.  Pain/Pressure: Absent     Pelvic: Cervical exam deferred        Extremities: Normal range of motion.  Edema: None  Mental Status: Normal mood and affect. Normal behavior. Normal judgment and thought content.   Assessment and Plan:  Pregnancy: G2P1001 at [redacted]w[redacted]d 1. Supervision of other normal pregnancy, antepartum - Routine OB. Doing well, no concerns - Reviewed anatomy ultrasound with patient. Has f/u growth in 4 weeks - Anticipatory guidance for upcoming appointments  2. Obesity affecting pregnancy in first trimester - Continue bASA  3. History of severe pre-eclampsia - Watch BP's - bASA  4. [redacted] weeks gestation of pregnancy - Endorses fetal movement   Preterm labor symptoms and general obstetric  precautions including but not limited to vaginal bleeding, contractions, leaking of fluid and fetal movement were reviewed in detail with the patient. Please refer to After Visit Summary for other counseling recommendations.   Return in about 4 weeks (around 01/07/2022) for ROB.  Future Appointments  Date Time Provider Department Center  01/01/2022  7:30 AM [facility] NURSE [facility]  01/01/2022  7:45 AM [facility] US4 [facility]  01/10/2022  8:50 AM [name], CNM [facility]    Brand Males, CNM

## 2021-12-24 ENCOUNTER — Ambulatory Visit (INDEPENDENT_AMBULATORY_CARE_PROVIDER_SITE_OTHER): Payer: 59 | Admitting: Obstetrics and Gynecology

## 2021-12-24 VITALS — BP 141/91 | HR 88 | Wt 251.0 lb

## 2021-12-24 DIAGNOSIS — O162 Unspecified maternal hypertension, second trimester: Secondary | ICD-10-CM

## 2021-12-24 DIAGNOSIS — Z8759 Personal history of other complications of pregnancy, childbirth and the puerperium: Secondary | ICD-10-CM

## 2021-12-24 MED ORDER — LABETALOL HCL 100 MG PO TABS: 100.0000 mg | ORAL_TABLET | Freq: Three times a day (TID) | ORAL | 1 refills | Status: DC

## 2021-12-24 NOTE — Progress Notes (Signed)
Carla Hall is a 26 y.o. female G28P1001 @ [redacted]w[redacted]d with a history of severe preeclampsia with previous pregnancy.  She sent a message through MyChart to the office today reporting elevated blood pressure at home (140/90's).  She was instructed to come into the office for a blood pressure check.  She has no symptoms, no headache, no vision changes, no upper abdominal pain.  Vitals:   12/24/21 1547  Weight: 251 lb (113.9 kg)       12/24/2021    3:47 PM 12/10/2021    8:46 AM 12/04/2021    8:20 AM  Vitals with BMI  Weight 251 lbs 694 lbs   Systolic 370 052 591  Diastolic 91 83 75  Pulse 88 86 97     Bp's have been borderline throughout her pregnancy.   1. History of severe pre-eclampsia  Increase BASA to 162 mg daily Start labetalol 100 mg PO TID  Send a BP reading through mychart on Friday morning.  Please contact the office with any changes.   2. Hypertension affecting pregnancy in second trimester  - CBC w/Diff - Comp Met (CMET) - Protein / creatinine ratio, urine    Benford Asch, Artist Pais, NP 12/24/2021 4:11 PM

## 2021-12-25 LAB — CBC WITH DIFFERENTIAL/PLATELET
Absolute Monocytes: 470 cells/uL (ref 200–950)
Basophils Absolute: 38 cells/uL (ref 0–200)
Basophils Relative: 0.4 %
Eosinophils Absolute: 94 cells/uL (ref 15–500)
Eosinophils Relative: 1 %
HCT: 34 % — ABNORMAL LOW (ref 35.0–45.0)
Hemoglobin: 11.6 g/dL — ABNORMAL LOW (ref 11.7–15.5)
Lymphs Abs: 2228 cells/uL (ref 850–3900)
MCH: 29.7 pg (ref 27.0–33.0)
MCHC: 34.1 g/dL (ref 32.0–36.0)
MCV: 87.2 fL (ref 80.0–100.0)
MPV: 9.8 fL (ref 7.5–12.5)
Monocytes Relative: 5 %
Neutro Abs: 6571 cells/uL (ref 1500–7800)
Neutrophils Relative %: 69.9 %
Platelets: 268 10*3/uL (ref 140–400)
RBC: 3.9 10*6/uL (ref 3.80–5.10)
RDW: 13 % (ref 11.0–15.0)
Total Lymphocyte: 23.7 %
WBC: 9.4 10*3/uL (ref 3.8–10.8)

## 2021-12-25 LAB — COMPREHENSIVE METABOLIC PANEL
AG Ratio: 1.4 (calc) (ref 1.0–2.5)
ALT: 10 U/L (ref 6–29)
AST: 9 U/L — ABNORMAL LOW (ref 10–30)
Albumin: 3.8 g/dL (ref 3.6–5.1)
Alkaline phosphatase (APISO): 49 U/L (ref 31–125)
BUN: 7 mg/dL (ref 7–25)
CO2: 21 mmol/L (ref 20–32)
Calcium: 9 mg/dL (ref 8.6–10.2)
Chloride: 107 mmol/L (ref 98–110)
Creat: 0.51 mg/dL (ref 0.50–0.96)
Globulin: 2.8 g/dL (calc) (ref 1.9–3.7)
Glucose, Bld: 78 mg/dL (ref 65–99)
Potassium: 3.8 mmol/L (ref 3.5–5.3)
Sodium: 137 mmol/L (ref 135–146)
Total Bilirubin: 0.2 mg/dL (ref 0.2–1.2)
Total Protein: 6.6 g/dL (ref 6.1–8.1)

## 2021-12-25 LAB — PROTEIN / CREATININE RATIO, URINE
Creatinine, Urine: 34 mg/dL (ref 20–275)
Protein/Creat Ratio: 176 mg/g creat (ref 24–184)
Protein/Creatinine Ratio: 0.176 mg/mg creat (ref 0.024–0.184)
Total Protein, Urine: 6 mg/dL (ref 5–24)

## 2021-12-26 ENCOUNTER — Encounter: Payer: Self-pay | Admitting: Obstetrics and Gynecology

## 2021-12-26 DIAGNOSIS — O169 Unspecified maternal hypertension, unspecified trimester: Secondary | ICD-10-CM | POA: Insufficient documentation

## 2022-01-01 ENCOUNTER — Ambulatory Visit: Payer: 59 | Admitting: *Deleted

## 2022-01-01 ENCOUNTER — Ambulatory Visit: Payer: 59 | Attending: Obstetrics

## 2022-01-01 ENCOUNTER — Encounter: Payer: Self-pay | Admitting: *Deleted

## 2022-01-01 ENCOUNTER — Other Ambulatory Visit: Payer: Self-pay | Admitting: *Deleted

## 2022-01-01 VITALS — BP 140/70 | HR 92

## 2022-01-01 DIAGNOSIS — Z3A23 23 weeks gestation of pregnancy: Secondary | ICD-10-CM

## 2022-01-01 DIAGNOSIS — E669 Obesity, unspecified: Secondary | ICD-10-CM | POA: Diagnosis not present

## 2022-01-01 DIAGNOSIS — O09299 Supervision of pregnancy with other poor reproductive or obstetric history, unspecified trimester: Secondary | ICD-10-CM | POA: Insufficient documentation

## 2022-01-01 DIAGNOSIS — Z6835 Body mass index (BMI) 35.0-35.9, adult: Secondary | ICD-10-CM | POA: Insufficient documentation

## 2022-01-01 DIAGNOSIS — Z3689 Encounter for other specified antenatal screening: Secondary | ICD-10-CM | POA: Insufficient documentation

## 2022-01-01 DIAGNOSIS — Z362 Encounter for other antenatal screening follow-up: Secondary | ICD-10-CM | POA: Insufficient documentation

## 2022-01-01 DIAGNOSIS — O10912 Unspecified pre-existing hypertension complicating pregnancy, second trimester: Secondary | ICD-10-CM

## 2022-01-01 DIAGNOSIS — O09292 Supervision of pregnancy with other poor reproductive or obstetric history, second trimester: Secondary | ICD-10-CM | POA: Diagnosis not present

## 2022-01-01 DIAGNOSIS — O10012 Pre-existing essential hypertension complicating pregnancy, second trimester: Secondary | ICD-10-CM

## 2022-01-01 DIAGNOSIS — O99212 Obesity complicating pregnancy, second trimester: Secondary | ICD-10-CM

## 2022-01-01 DIAGNOSIS — Z6833 Body mass index (BMI) 33.0-33.9, adult: Secondary | ICD-10-CM

## 2022-01-10 ENCOUNTER — Ambulatory Visit (INDEPENDENT_AMBULATORY_CARE_PROVIDER_SITE_OTHER): Payer: 59 | Admitting: Certified Nurse Midwife

## 2022-01-10 VITALS — BP 127/84 | HR 108 | Wt 257.0 lb

## 2022-01-10 DIAGNOSIS — O99212 Obesity complicating pregnancy, second trimester: Secondary | ICD-10-CM

## 2022-01-10 DIAGNOSIS — Z3480 Encounter for supervision of other normal pregnancy, unspecified trimester: Secondary | ICD-10-CM

## 2022-01-10 DIAGNOSIS — Z3482 Encounter for supervision of other normal pregnancy, second trimester: Secondary | ICD-10-CM

## 2022-01-10 DIAGNOSIS — Z3A24 24 weeks gestation of pregnancy: Secondary | ICD-10-CM

## 2022-01-10 DIAGNOSIS — O10919 Unspecified pre-existing hypertension complicating pregnancy, unspecified trimester: Secondary | ICD-10-CM

## 2022-01-10 NOTE — Progress Notes (Signed)
Subjective:  Carla Hall is a 26 y.o. G2P1001 at [redacted]w[redacted]d being seen today for ongoing prenatal care.  She is currently monitored for the following issues for this high-risk pregnancy and has Mild intermittent asthma; Supervision of other normal pregnancy, antepartum; History of severe pre-eclampsia; Obesity affecting pregnancy; and Hypertension in pregnancy on their problem list.  Patient reports no complaints.  Contractions: Not present. Vag. Bleeding: None.  Movement: Present. Denies leaking of fluid.   The following portions of the patient's history were reviewed and updated as appropriate: allergies, current medications, past family history, past medical history, past social history, past surgical history and problem list. Problem list updated.  Objective:   Vitals:   01/10/22 0844  BP: 127/84  Pulse: (!) 108  Weight: 257 lb (116.6 kg)    Fetal Status: Fetal Heart Rate (bpm): 149 Fundal Height: 24 cm Movement: Present  Presentation: Undeterminable  General:  Alert, oriented and cooperative. Patient is in no acute distress.  Skin: Skin is warm and dry. No rash noted.   Cardiovascular: Normal heart rate noted  Respiratory: Normal respiratory effort, no problems with respiration noted  Abdomen: Soft, gravid, appropriate for gestational age. Pain/Pressure: Absent     Pelvic: Vag. Bleeding: None Vag D/C Character: Thin   Cervical exam deferred        Extremities: Normal range of motion.  Edema: None  Mental Status: Normal mood and affect. Normal behavior. Normal judgment and thought content.   Urinalysis:      Assessment and Plan:  Pregnancy: G2P1001 at [redacted]w[redacted]d  1. Supervision of other normal pregnancy, antepartum  2. [redacted] weeks gestation of pregnancy  3. Chronic hypertension affecting pregnancy -stable on Labetalol 100 TID -start ante testing at 32w (prefers NST/AFI here vs MFM)  4. Obesity affecting pregnancy in second trimester -growth [place] planned -weight gain a little high,  she is exercising daily  Preterm labor symptoms and general obstetric precautions including but not limited to vaginal bleeding, contractions, leaking of fluid and fetal movement were reviewed in detail with the patient. Please refer to After Visit Summary for other counseling recommendations.  Return in about 4 weeks (around 02/07/2022).   [name], CNM

## 2022-01-20 ENCOUNTER — Other Ambulatory Visit: Payer: Self-pay | Admitting: *Deleted

## 2022-01-20 MED ORDER — LABETALOL HCL 100 MG PO TABS: 100.0000 mg | ORAL_TABLET | Freq: Three times a day (TID) | ORAL | 1 refills | Status: DC

## 2022-01-28 ENCOUNTER — Ambulatory Visit: Payer: 59 | Attending: Obstetrics

## 2022-01-28 ENCOUNTER — Ambulatory Visit: Payer: 59 | Admitting: *Deleted

## 2022-01-28 ENCOUNTER — Other Ambulatory Visit: Payer: Self-pay | Admitting: *Deleted

## 2022-01-28 VITALS — BP 138/77 | HR 98

## 2022-01-28 DIAGNOSIS — E669 Obesity, unspecified: Secondary | ICD-10-CM

## 2022-01-28 DIAGNOSIS — O10912 Unspecified pre-existing hypertension complicating pregnancy, second trimester: Secondary | ICD-10-CM | POA: Diagnosis present

## 2022-01-28 DIAGNOSIS — O10012 Pre-existing essential hypertension complicating pregnancy, second trimester: Secondary | ICD-10-CM

## 2022-01-28 DIAGNOSIS — O09299 Supervision of pregnancy with other poor reproductive or obstetric history, unspecified trimester: Secondary | ICD-10-CM

## 2022-01-28 DIAGNOSIS — O99212 Obesity complicating pregnancy, second trimester: Secondary | ICD-10-CM

## 2022-01-28 DIAGNOSIS — Z6833 Body mass index (BMI) 33.0-33.9, adult: Secondary | ICD-10-CM | POA: Diagnosis present

## 2022-01-28 DIAGNOSIS — O09292 Supervision of pregnancy with other poor reproductive or obstetric history, second trimester: Secondary | ICD-10-CM

## 2022-01-28 DIAGNOSIS — Z3A27 27 weeks gestation of pregnancy: Secondary | ICD-10-CM

## 2022-01-28 NOTE — Addendum Note (Signed)
Addended by: Barton Dubois on: 01/28/2022 08:33 AM   Modules accepted: Level of Service

## 2022-01-28 NOTE — Addendum Note (Signed)
Addended by: Barton Dubois on: 01/28/2022 08:35 AM   Modules accepted: Level of Service

## 2022-02-05 ENCOUNTER — Encounter: Payer: Self-pay | Admitting: *Deleted

## 2022-02-05 ENCOUNTER — Other Ambulatory Visit (INDEPENDENT_AMBULATORY_CARE_PROVIDER_SITE_OTHER): Payer: 59 | Admitting: *Deleted

## 2022-02-05 VITALS — BP 145/78 | HR 77

## 2022-02-05 DIAGNOSIS — O163 Unspecified maternal hypertension, third trimester: Secondary | ICD-10-CM

## 2022-02-05 DIAGNOSIS — O169 Unspecified maternal hypertension, unspecified trimester: Secondary | ICD-10-CM

## 2022-02-05 DIAGNOSIS — Z3A28 28 weeks gestation of pregnancy: Secondary | ICD-10-CM

## 2022-02-05 NOTE — Progress Notes (Cosign Needed)
Pt here for a BP check.  She states her BP yesterday at home was 160/76 and later that day 158/84.  She states that she has had a slight headache  and lightheadness that she take tylenol for.  She denies any visual changes and no noted swelling in legs or feet.  BP today in ffice was 146/79 and 145/78.  Due to her H/O Pre-E labs drawn today and appt to see MD tomorrow.  Pt is currently taking Labetalol 100 mg TID.  Pt is aware that if she has any visual changes or BP get over 150/90 or H/A that does not go away she should go to hospital for evaluation.

## 2022-02-06 ENCOUNTER — Ambulatory Visit (INDEPENDENT_AMBULATORY_CARE_PROVIDER_SITE_OTHER): Payer: 59 | Admitting: Obstetrics and Gynecology

## 2022-02-06 VITALS — BP 135/84 | HR 89 | Wt 262.0 lb

## 2022-02-06 DIAGNOSIS — Z3480 Encounter for supervision of other normal pregnancy, unspecified trimester: Secondary | ICD-10-CM

## 2022-02-06 DIAGNOSIS — O10913 Unspecified pre-existing hypertension complicating pregnancy, third trimester: Secondary | ICD-10-CM

## 2022-02-06 DIAGNOSIS — Z23 Encounter for immunization: Secondary | ICD-10-CM

## 2022-02-06 DIAGNOSIS — O99213 Obesity complicating pregnancy, third trimester: Secondary | ICD-10-CM

## 2022-02-06 DIAGNOSIS — Z3A28 28 weeks gestation of pregnancy: Secondary | ICD-10-CM

## 2022-02-06 LAB — COMPREHENSIVE METABOLIC PANEL
AG Ratio: 1.4 (calc) (ref 1.0–2.5)
ALT: 12 U/L (ref 6–29)
AST: 14 U/L (ref 10–30)
Albumin: 3.6 g/dL (ref 3.6–5.1)
Alkaline phosphatase (APISO): 55 U/L (ref 31–125)
BUN: 7 mg/dL (ref 7–25)
CO2: 19 mmol/L — ABNORMAL LOW (ref 20–32)
Calcium: 9.1 mg/dL (ref 8.6–10.2)
Chloride: 108 mmol/L (ref 98–110)
Creat: 0.66 mg/dL (ref 0.50–0.96)
Globulin: 2.6 g/dL (calc) (ref 1.9–3.7)
Glucose, Bld: 85 mg/dL (ref 65–99)
Potassium: 3.9 mmol/L (ref 3.5–5.3)
Sodium: 137 mmol/L (ref 135–146)
Total Bilirubin: 0.3 mg/dL (ref 0.2–1.2)
Total Protein: 6.2 g/dL (ref 6.1–8.1)

## 2022-02-06 LAB — CBC
HCT: 32.1 % — ABNORMAL LOW (ref 35.0–45.0)
Hemoglobin: 10.6 g/dL — ABNORMAL LOW (ref 11.7–15.5)
MCH: 29.9 pg (ref 27.0–33.0)
MCHC: 33 g/dL (ref 32.0–36.0)
MCV: 90.7 fL (ref 80.0–100.0)
MPV: 9.5 fL (ref 7.5–12.5)
Platelets: 282 10*3/uL (ref 140–400)
RBC: 3.54 10*6/uL — ABNORMAL LOW (ref 3.80–5.10)
RDW: 13.4 % (ref 11.0–15.0)
WBC: 9.5 10*3/uL (ref 3.8–10.8)

## 2022-02-06 LAB — PROTEIN / CREATININE RATIO, URINE
Creatinine, Urine: 79 mg/dL (ref 20–275)
Protein/Creat Ratio: 152 mg/g creat (ref 24–184)
Protein/Creatinine Ratio: 0.152 mg/mg creat (ref 0.024–0.184)
Total Protein, Urine: 12 mg/dL (ref 5–24)

## 2022-02-06 NOTE — Progress Notes (Signed)
   PRENATAL VISIT NOTE  Subjective:  Carla Hall is a 26 y.o. G2P1001 at [redacted]w[redacted]d being seen today for ongoing prenatal care.  She is currently monitored for the following issues for this high-risk pregnancy and has Mild intermittent asthma; Supervision of other normal pregnancy, antepartum; History of severe pre-eclampsia; Obesity affecting pregnancy; and Hypertension in pregnancy on their problem list.  Patient reports no complaints.  Contractions: Not present. Vag. Bleeding: None.  Movement: Present. Denies leaking of fluid.   The following portions of the patient's history were reviewed and updated as appropriate: allergies, current medications, past family history, past medical history, past social history, past surgical history and problem list.   Objective:   Vitals:   02/06/22 1000  BP: 135/84  Pulse: 89  Weight: 262 lb (118.8 kg)    Fetal Status: Fetal Heart Rate (bpm): 141   Movement: Present     General:  Alert, oriented and cooperative. Patient is in no acute distress.  Skin: Skin is warm and dry. No rash noted.   Cardiovascular: Normal heart rate noted  Respiratory: Normal respiratory effort, no problems with respiration noted  Abdomen: Soft, gravid, appropriate for gestational age.  Pain/Pressure: Absent     Pelvic: Cervical exam deferred        Extremities: Normal range of motion.  Edema: None  Mental Status: Normal mood and affect. Normal behavior. Normal judgment and thought content.   Assessment and Plan:  Pregnancy: G2P1001 at [redacted]w[redacted]d 1. Pre-existing hypertension during pregnancy in third trimester, unspecified pre-existing hypertension type - Current regimen is 100 BID. She had PreE labs yesterday which were all normal. P/C ratio was 152. Cr 0.66. Plt 282 - BP today is normal. Will leave dose at current regimen.  - Next growth scheduled for 10/3 - last was on 8/29 - 52%ile, normal afi.  - Keep appt for next week for short interval follow up and see if Labetalol  needs to be increased.  - Reviewed timing of delivery depends on BP and symptoms. Currently would be 39w. Reviewed distinction of CHTN.   2. Supervision of other normal pregnancy, antepartum - Needs 28w labs at her convenience - has them scheduled for next week.  - Recommended flu shot - she accepts  3. Obesity affecting pregnancy in third trimester  Preterm labor symptoms and general obstetric precautions including but not limited to vaginal bleeding, contractions, leaking of fluid and fetal movement were reviewed in detail with the patient. Please refer to After Visit Summary for other counseling recommendations.   Return in about 1 week (around 02/13/2022) for OB VISIT, MD or APP.  Future Appointments  Date Time Provider Department Center  02/11/2022  8:10 AM Rasch, Harolyn Rutherford, NP [facility]  03/04/2022  7:45 AM [facility] NURSE [facility]  03/04/2022  8:00 AM [facility]1 [facility]    Milas Hock, MD

## 2022-02-06 NOTE — Addendum Note (Signed)
Addended by: Granville Lewis on: 02/06/2022 10:23 AM   Modules accepted: Orders

## 2022-02-11 ENCOUNTER — Ambulatory Visit (INDEPENDENT_AMBULATORY_CARE_PROVIDER_SITE_OTHER): Payer: 59 | Admitting: Obstetrics and Gynecology

## 2022-02-11 VITALS — BP 128/86 | HR 94 | Wt 262.0 lb

## 2022-02-11 DIAGNOSIS — Z3483 Encounter for supervision of other normal pregnancy, third trimester: Secondary | ICD-10-CM

## 2022-02-11 DIAGNOSIS — Z23 Encounter for immunization: Secondary | ICD-10-CM | POA: Diagnosis not present

## 2022-02-11 DIAGNOSIS — Z3480 Encounter for supervision of other normal pregnancy, unspecified trimester: Secondary | ICD-10-CM

## 2022-02-11 DIAGNOSIS — O10913 Unspecified pre-existing hypertension complicating pregnancy, third trimester: Secondary | ICD-10-CM

## 2022-02-11 NOTE — Progress Notes (Signed)
   PRENATAL VISIT NOTE  Subjective:  Carla Hall is a 26 y.o. G2P1001 at [redacted]w[redacted]d being seen today for ongoing prenatal care.  She is currently monitored for the following issues for this low-risk pregnancy and has Mild intermittent asthma; Supervision of other normal pregnancy, antepartum; History of severe pre-eclampsia; Obesity affecting pregnancy; and Hypertension in pregnancy on their problem list.  Patient reports no complaints.  Contractions: Not present. Vag. Bleeding: None.  Movement: Present. Denies leaking of fluid.   The following portions of the patient's history were reviewed and updated as appropriate: allergies, current medications, past family history, past medical history, past social history, past surgical history and problem list.   Objective:   Vitals:   02/11/22 0806  BP: 128/86  Pulse: 94  Weight: 262 lb (118.8 kg)    Fetal Status: Fetal Heart Rate (bpm): 137   Movement: Present     General:  Alert, oriented and cooperative. Patient is in no acute distress.  Skin: Skin is warm and dry. No rash noted.   Cardiovascular: Normal heart rate noted  Respiratory: Normal respiratory effort, no problems with respiration noted  Abdomen: Soft, gravid, appropriate for gestational age.  Pain/Pressure: Absent     Pelvic: Cervical exam deferred        Extremities: Normal range of motion.  Edema: None  Mental Status: Normal mood and affect. Normal behavior. Normal judgment and thought content.   Assessment and Plan:  Pregnancy: G2P1001 at [redacted]w[redacted]d   1. Supervision of other normal pregnancy, antepartum  - 2Hr GTT w/ 1 Hr Carpenter 75 g - HIV antibody (with reflex) - RPR - CBC - TDAP given - FLU is up to date.   2. Pre-existing hypertension during pregnancy in third trimester, unspecified pre-existing hypertension type  - She is getting more nervous as delivery approaches given her history of severe Pre E. - Reassurance offered. We will continue to monitor BP closely  in the office and with MFM.  - Continue 160 mg BASA, Continue Labetalol 100 mg TID - Continue growth [place] with MFM.  - Antenatal testing @ 32 weeks.    Preterm  labor symptoms and general obstetric precautions including but not limited to vaginal bleeding, contractions, leaking of fluid and fetal movement were reviewed in detail with the patient. Please refer to After Visit Summary for other counseling recommendations.   No follow-ups on file.  Future Appointments  Date Time Provider Department Center  03/04/2022  7:45 AM [facility] NURSE [facility]  03/04/2022  8:00 AM [facility]1 [facility]    Venia Carbon, NP

## 2022-02-12 LAB — HIV ANTIBODY (ROUTINE TESTING W REFLEX): HIV 1&2 Ab, 4th Generation: NONREACTIVE

## 2022-02-12 LAB — CBC
HCT: 32.6 % — ABNORMAL LOW (ref 35.0–45.0)
Hemoglobin: 11 g/dL — ABNORMAL LOW (ref 11.7–15.5)
MCH: 29.3 pg (ref 27.0–33.0)
MCHC: 33.7 g/dL (ref 32.0–36.0)
MCV: 86.7 fL (ref 80.0–100.0)
MPV: 9.6 fL (ref 7.5–12.5)
Platelets: 277 10*3/uL (ref 140–400)
RBC: 3.76 10*6/uL — ABNORMAL LOW (ref 3.80–5.10)
RDW: 13.4 % (ref 11.0–15.0)
WBC: 10.5 10*3/uL (ref 3.8–10.8)

## 2022-02-12 LAB — 2HR GTT W 1 HR, CARPENTER, 75 G
Glucose, 1 Hr, Gest: 107 mg/dL (ref 65–179)
Glucose, 2 Hr, Gest: 94 mg/dL (ref 65–152)
Glucose, Fasting, Gest: 75 mg/dL (ref 65–91)

## 2022-02-12 LAB — RPR: RPR Ser Ql: NONREACTIVE

## 2022-02-17 ENCOUNTER — Other Ambulatory Visit: Payer: Self-pay | Admitting: *Deleted

## 2022-02-17 MED ORDER — ASPIRIN 81 MG PO CHEW: 81.0000 mg | CHEWABLE_TABLET | Freq: Every day | ORAL | 3 refills | Status: DC

## 2022-02-17 MED ORDER — LABETALOL HCL 100 MG PO TABS: 100.0000 mg | ORAL_TABLET | Freq: Three times a day (TID) | ORAL | 1 refills | Status: DC

## 2022-02-19 ENCOUNTER — Other Ambulatory Visit: Payer: Self-pay | Admitting: *Deleted

## 2022-02-19 MED ORDER — ASPIRIN 81 MG PO CHEW: 81.0000 mg | CHEWABLE_TABLET | Freq: Every day | ORAL | 3 refills | Status: DC

## 2022-02-25 ENCOUNTER — Ambulatory Visit (INDEPENDENT_AMBULATORY_CARE_PROVIDER_SITE_OTHER): Payer: 59 | Admitting: Obstetrics and Gynecology

## 2022-02-25 DIAGNOSIS — Z3A31 31 weeks gestation of pregnancy: Secondary | ICD-10-CM

## 2022-02-25 DIAGNOSIS — Z3483 Encounter for supervision of other normal pregnancy, third trimester: Secondary | ICD-10-CM

## 2022-02-25 DIAGNOSIS — Z3480 Encounter for supervision of other normal pregnancy, unspecified trimester: Secondary | ICD-10-CM

## 2022-02-25 NOTE — Progress Notes (Signed)
   PRENATAL VISIT NOTE  Subjective:  Carla Hall is a 26 y.o. G2P1001 at [redacted]w[redacted]d being seen today for ongoing prenatal care.  She is currently monitored for the following issues for this high-risk pregnancy and has Mild intermittent asthma; Supervision of other normal pregnancy, antepartum; History of severe pre-eclampsia; Obesity affecting pregnancy; and Hypertension in pregnancy on their problem list.  Patient reports no complaints.  Contractions: Not present. Vag. Bleeding: None.  Movement: Present. Denies leaking of fluid.   The following portions of the patient's history were reviewed and updated as appropriate: allergies, current medications, past family history, past medical history, past social history, past surgical history and problem list.   Objective:   Vitals:   02/25/22 0821  BP: 129/84  Pulse: 96  Weight: 261 lb (118.4 kg)    Fetal Status: Fetal Heart Rate (bpm): 147   Movement: Present     General:  Alert, oriented and cooperative. Patient is in no acute distress.  Skin: Skin is warm and dry. No rash noted.   Cardiovascular: Normal heart rate noted  Respiratory: Normal respiratory effort, no problems with respiration noted  Abdomen: Soft, gravid, appropriate for gestational age.  Pain/Pressure: Absent     Pelvic: Cervical exam deferred        Extremities: Normal range of motion.  Edema: None  Mental Status: Normal mood and affect. Normal behavior. Normal judgment and thought content.   Assessment and Plan:  Pregnancy: G2P1001 at [redacted]w[redacted]d  1. Supervision of other normal pregnancy, antepartum  BP good! Continue labetalol, let us know if BP starts approaching 135/85 Antenatal testing to start next week with MFM.  She desires to do twice weekly NST's following MFM visit next week Discussed Pre E precautions Would consider 39 week delivery if BP remains normotensive until then.     Preterm labor symptoms and general obstetric precautions including but not limited  to vaginal bleeding, contractions, leaking of fluid and fetal movement were reviewed in detail with the patient. Please refer to After Visit Summary for other counseling recommendations.   No follow-ups on file.  Future Appointments  Date Time Provider [place]  03/04/2022  7:45 AM [facility] NURSE [facility]  03/04/2022  8:00 AM [facility] US1 [facility] [place], NP

## 2022-03-03 ENCOUNTER — Other Ambulatory Visit: Payer: Self-pay

## 2022-03-03 ENCOUNTER — Telehealth: Payer: Self-pay | Admitting: *Deleted

## 2022-03-03 DIAGNOSIS — O10913 Unspecified pre-existing hypertension complicating pregnancy, third trimester: Secondary | ICD-10-CM

## 2022-03-03 NOTE — Telephone Encounter (Signed)
Left patient an urgent message to call the office about next week appointments. Schedule has to change due to RN not being in the office.

## 2022-03-04 ENCOUNTER — Ambulatory Visit: Payer: 59 | Attending: Obstetrics and Gynecology

## 2022-03-04 ENCOUNTER — Encounter: Payer: Self-pay | Admitting: *Deleted

## 2022-03-04 ENCOUNTER — Ambulatory Visit: Payer: 59 | Admitting: *Deleted

## 2022-03-04 ENCOUNTER — Other Ambulatory Visit: Payer: Self-pay | Admitting: *Deleted

## 2022-03-04 VITALS — BP 136/73 | HR 86

## 2022-03-04 DIAGNOSIS — O10912 Unspecified pre-existing hypertension complicating pregnancy, second trimester: Secondary | ICD-10-CM | POA: Insufficient documentation

## 2022-03-04 DIAGNOSIS — E669 Obesity, unspecified: Secondary | ICD-10-CM | POA: Diagnosis not present

## 2022-03-04 DIAGNOSIS — Z3A32 32 weeks gestation of pregnancy: Secondary | ICD-10-CM | POA: Insufficient documentation

## 2022-03-04 DIAGNOSIS — Z3480 Encounter for supervision of other normal pregnancy, unspecified trimester: Secondary | ICD-10-CM

## 2022-03-04 DIAGNOSIS — O99213 Obesity complicating pregnancy, third trimester: Secondary | ICD-10-CM

## 2022-03-04 DIAGNOSIS — O09293 Supervision of pregnancy with other poor reproductive or obstetric history, third trimester: Secondary | ICD-10-CM | POA: Diagnosis not present

## 2022-03-04 DIAGNOSIS — Z8759 Personal history of other complications of pregnancy, childbirth and the puerperium: Secondary | ICD-10-CM

## 2022-03-04 DIAGNOSIS — O10013 Pre-existing essential hypertension complicating pregnancy, third trimester: Secondary | ICD-10-CM | POA: Insufficient documentation

## 2022-03-04 DIAGNOSIS — O10913 Unspecified pre-existing hypertension complicating pregnancy, third trimester: Secondary | ICD-10-CM

## 2022-03-10 ENCOUNTER — Ambulatory Visit: Payer: 59 | Attending: Obstetrics and Gynecology

## 2022-03-10 ENCOUNTER — Encounter: Payer: Self-pay | Admitting: *Deleted

## 2022-03-10 ENCOUNTER — Ambulatory Visit: Payer: 59 | Admitting: *Deleted

## 2022-03-10 ENCOUNTER — Other Ambulatory Visit: Payer: 59

## 2022-03-10 VITALS — BP 141/82 | HR 78

## 2022-03-10 DIAGNOSIS — E669 Obesity, unspecified: Secondary | ICD-10-CM | POA: Diagnosis not present

## 2022-03-10 DIAGNOSIS — Z3480 Encounter for supervision of other normal pregnancy, unspecified trimester: Secondary | ICD-10-CM | POA: Diagnosis present

## 2022-03-10 DIAGNOSIS — O99213 Obesity complicating pregnancy, third trimester: Secondary | ICD-10-CM | POA: Diagnosis not present

## 2022-03-10 DIAGNOSIS — Z8759 Personal history of other complications of pregnancy, childbirth and the puerperium: Secondary | ICD-10-CM | POA: Insufficient documentation

## 2022-03-10 DIAGNOSIS — O10913 Unspecified pre-existing hypertension complicating pregnancy, third trimester: Secondary | ICD-10-CM | POA: Diagnosis present

## 2022-03-10 DIAGNOSIS — Z3A33 33 weeks gestation of pregnancy: Secondary | ICD-10-CM | POA: Diagnosis not present

## 2022-03-10 DIAGNOSIS — O113 Pre-existing hypertension with pre-eclampsia, third trimester: Secondary | ICD-10-CM | POA: Diagnosis not present

## 2022-03-10 DIAGNOSIS — O10013 Pre-existing essential hypertension complicating pregnancy, third trimester: Secondary | ICD-10-CM

## 2022-03-10 DIAGNOSIS — O09293 Supervision of pregnancy with other poor reproductive or obstetric history, third trimester: Secondary | ICD-10-CM | POA: Diagnosis not present

## 2022-03-10 NOTE — Progress Notes (Signed)
   PRENATAL VISIT NOTE  Subjective:  Carla Hall is a 26 y.o. G2P1001 at [redacted]w[redacted]d being seen today for ongoing prenatal care.  She is currently monitored for the following issues for this high-risk pregnancy and has Mild intermittent asthma; Supervision of other normal pregnancy, antepartum; History of severe pre-eclampsia; Obesity affecting pregnancy; and Hypertension in pregnancy on their problem list.  Patient reports no complaints.  Contractions: Not present. Vag. Bleeding: None.  Movement: Present. Denies leaking of fluid.   The following portions of the patient's history were reviewed and updated as appropriate: allergies, current medications, past family history, past medical history, past social history, past surgical history and problem list.   Objective:   Vitals:   03/13/22 0825  BP: 138/86  Pulse: 85  Weight: 265 lb (120.2 kg)    Fetal Status: Fetal Heart Rate (bpm): 142   Movement: Present     General:  Alert, oriented and cooperative. Patient is in no acute distress.  Skin: Skin is warm and dry. No rash noted.   Cardiovascular: Normal heart rate noted  Respiratory: Normal respiratory effort, no problems with respiration noted  Abdomen: Soft, gravid, appropriate for gestational age.  Pain/Pressure: Absent     Pelvic: Cervical exam deferred        Extremities: Normal range of motion.     Mental Status: Normal mood and affect. Normal behavior. Normal judgment and thought content.   Assessment and Plan:  Pregnancy: G2P1001 at [redacted]w[redacted]d 1. Pre-existing hypertension during pregnancy in third trimester, unspecified pre-existing hypertension type BP well controlled on labetalol  Growth on 10/3 was normal. Continue weekly antenatal testign.  Reviewed IOL at 39w unless indicated sooner I.e. SIPE  2. Supervision of other normal pregnancy, antepartum S/p flu and tdap Cultures next time.   Preterm labor symptoms and general obstetric precautions including but not limited to  vaginal bleeding, contractions, leaking of fluid and fetal movement were reviewed in detail with the patient. Please refer to After Visit Summary for other counseling recommendations.   No follow-ups on file.  Future Appointments  Date Time Provider [place]  03/18/2022  9:30 AM [facility] NURSE [facility]  03/21/2022  9:30 AM Rasch, Artist Pais, NP [facility]  03/21/2022 10:00 AM [facility]URSE [facility]  03/25/2022  9:30 AM [facility]  03/28/2022 10:00 AM [facility]  04/01/2022  9:30 AM Rasch, Artist Pais, NP [facility]  04/04/2022  8:30 AM [facility] NURSE [facility]  04/04/2022  8:45 AM [facility] US6 [facility] [place]  04/08/2022  9:30 AM [facility]  04/11/2022  9:30 AM Rasch, Artist Pais, NP [facility]  04/11/2022 10:00 AM [facility] NURSE [facility]    Radene Gunning, MD

## 2022-03-11 ENCOUNTER — Ambulatory Visit: Payer: 59

## 2022-03-11 ENCOUNTER — Other Ambulatory Visit: Payer: 59

## 2022-03-13 ENCOUNTER — Ambulatory Visit (INDEPENDENT_AMBULATORY_CARE_PROVIDER_SITE_OTHER): Payer: 59 | Admitting: Obstetrics and Gynecology

## 2022-03-13 ENCOUNTER — Encounter: Payer: Self-pay | Admitting: Obstetrics and Gynecology

## 2022-03-13 ENCOUNTER — Other Ambulatory Visit: Payer: 59

## 2022-03-13 VITALS — BP 138/86 | HR 85 | Wt 265.0 lb

## 2022-03-13 DIAGNOSIS — Z3480 Encounter for supervision of other normal pregnancy, unspecified trimester: Secondary | ICD-10-CM

## 2022-03-13 DIAGNOSIS — Z3A33 33 weeks gestation of pregnancy: Secondary | ICD-10-CM

## 2022-03-13 DIAGNOSIS — O10913 Unspecified pre-existing hypertension complicating pregnancy, third trimester: Secondary | ICD-10-CM

## 2022-03-18 ENCOUNTER — Ambulatory Visit (INDEPENDENT_AMBULATORY_CARE_PROVIDER_SITE_OTHER): Payer: 59 | Admitting: *Deleted

## 2022-03-18 ENCOUNTER — Ambulatory Visit (INDEPENDENT_AMBULATORY_CARE_PROVIDER_SITE_OTHER): Payer: 59

## 2022-03-18 VITALS — BP 153/80 | HR 80

## 2022-03-18 DIAGNOSIS — O133 Gestational [pregnancy-induced] hypertension without significant proteinuria, third trimester: Secondary | ICD-10-CM

## 2022-03-18 DIAGNOSIS — Z3A34 34 weeks gestation of pregnancy: Secondary | ICD-10-CM

## 2022-03-18 DIAGNOSIS — O10919 Unspecified pre-existing hypertension complicating pregnancy, unspecified trimester: Secondary | ICD-10-CM | POA: Diagnosis not present

## 2022-03-18 DIAGNOSIS — Z8759 Personal history of other complications of pregnancy, childbirth and the puerperium: Secondary | ICD-10-CM

## 2022-03-18 DIAGNOSIS — O99213 Obesity complicating pregnancy, third trimester: Secondary | ICD-10-CM

## 2022-03-18 DIAGNOSIS — O10913 Unspecified pre-existing hypertension complicating pregnancy, third trimester: Secondary | ICD-10-CM

## 2022-03-18 DIAGNOSIS — Z3480 Encounter for supervision of other normal pregnancy, unspecified trimester: Secondary | ICD-10-CM

## 2022-03-18 MED ORDER — LABETALOL HCL 200 MG PO TABS: 200.0000 mg | ORAL_TABLET | Freq: Two times a day (BID) | ORAL | 3 refills | Status: DC

## 2022-03-18 NOTE — Progress Notes (Signed)
First BP 151/81 Repeat BP 153/80

## 2022-03-18 NOTE — Progress Notes (Signed)
NST Reactive

## 2022-03-18 NOTE — Progress Notes (Signed)
   PRENATAL VISIT NOTE  Subjective:  Carla Hall is a 26 y.o. G2P1001 at 34w1dbeing seen today for ongoing prenatal care.  She is currently monitored for the following issues for this high-risk pregnancy and has Mild intermittent asthma; Supervision of other normal pregnancy, antepartum; History of severe pre-eclampsia; Obesity affecting pregnancy; and Hypertension in pregnancy on their problem list.  Patient presented for RN visit and NST, however patient's BP's were found to be elevated. She reports no complaints.  She denies headaches, vision changes, or RUQ/epigastric pain. She is taking Labetalol 1051mTID. She reports she did take her morning dose.  .  .   . Denies leaking of fluid.   The following portions of the patient's history were reviewed and updated as appropriate: allergies, current medications, past family history, past medical history, past social history, past surgical history and problem list.   Objective:  There were no vitals filed for this visit.  Fetal Status:           General:  Alert, oriented and cooperative. Patient is in no acute distress.  Skin: Skin is warm and dry. No rash noted.   Cardiovascular: Normal heart rate noted  Respiratory: Normal respiratory effort, no problems with respiration noted  Abdomen: Soft, gravid, appropriate for gestational age.        Pelvic: Cervical exam deferred        Extremities: Normal range of motion.     Mental Status: Normal mood and affect. Normal behavior. Normal judgment and thought content.   Assessment and Plan:  Pregnancy: G2P1001 at 3438w1d Chronic hypertension affecting pregnancy - BP 151/81. Repeat 153/80.  - Asymptomatic.  - Will order labs - Increase labetalol to 200 BID - Patient to retake BP at home - Reviewed PEC s/s and strict precautions to seek care in MAU - Has ROB this week-keep appointment  - CBC - Comp Met (CMET) - Protein / creatinine ratio, urine - labetalol (NORMODYNE) 200 MG tablet;  Take 1 tablet (200 mg total) by mouth 2 (two) times daily.  Dispense: 60 tablet; Refill: 3  Preterm labor symptoms and general obstetric precautions including but not limited to vaginal bleeding, contractions, leaking of fluid and fetal movement were reviewed in detail with the patient. Please refer to After Visit Summary for other counseling recommendations.   No follow-ups on file.  Future Appointments  Date Time Provider [facility]0/20/2023  9:30 AM Rasch, JenArtist PaisP [facility]2023 10:00 AM [facility] NURSE [facility]  03/25/2022  9:30 AM [facility] NURSE [facility]  03/28/2022 10:00 AM [facility] NURSE [facility]  04/01/2022  9:30 AM Rasch, JenArtist PaisP [facility]  04/04/2022  8:30 AM [facility] NURSE [facility]  8:45 AM [facility] US6 [facility]/2021  9:30 AM [facility] NURSE [facility]  04/11/2022  9:30 AM Rasch, JenArtist PaisP [facility]  04/11/2022 10:00 AM [facility]    DanRenee HarderNM

## 2022-03-19 LAB — COMPREHENSIVE METABOLIC PANEL
AG Ratio: 1.4 (calc) (ref 1.0–2.5)
ALT: 9 U/L (ref 6–29)
AST: 11 U/L (ref 10–30)
Albumin: 3.5 g/dL — ABNORMAL LOW (ref 3.6–5.1)
Alkaline phosphatase (APISO): 83 U/L (ref 31–125)
BUN: 7 mg/dL (ref 7–25)
CO2: 21 mmol/L (ref 20–32)
Calcium: 8.9 mg/dL (ref 8.6–10.2)
Chloride: 109 mmol/L (ref 98–110)
Creat: 0.65 mg/dL (ref 0.50–0.96)
Globulin: 2.5 g/dL (calc) (ref 1.9–3.7)
Glucose, Bld: 83 mg/dL (ref 65–99)
Potassium: 3.7 mmol/L (ref 3.5–5.3)
Sodium: 138 mmol/L (ref 135–146)
Total Bilirubin: 0.2 mg/dL (ref 0.2–1.2)
Total Protein: 6 g/dL — ABNORMAL LOW (ref 6.1–8.1)

## 2022-03-19 LAB — CBC
HCT: 29 % — ABNORMAL LOW (ref 35.0–45.0)
Hemoglobin: 9.9 g/dL — ABNORMAL LOW (ref 11.7–15.5)
MCH: 29.6 pg (ref 27.0–33.0)
MCHC: 34.1 g/dL (ref 32.0–36.0)
MCV: 86.6 fL (ref 80.0–100.0)
MPV: 9.7 fL (ref 7.5–12.5)
Platelets: 272 10*3/uL (ref 140–400)
RBC: 3.35 10*6/uL — ABNORMAL LOW (ref 3.80–5.10)
RDW: 13.2 % (ref 11.0–15.0)
WBC: 9.1 10*3/uL (ref 3.8–10.8)

## 2022-03-19 LAB — PROTEIN / CREATININE RATIO, URINE
Creatinine, Urine: 50 mg/dL (ref 20–275)
Protein/Creat Ratio: 220 mg/g creat — ABNORMAL HIGH (ref 24–184)
Protein/Creatinine Ratio: 0.22 mg/mg creat — ABNORMAL HIGH (ref 0.024–0.184)
Total Protein, Urine: 11 mg/dL (ref 5–24)

## 2022-03-21 ENCOUNTER — Ambulatory Visit (INDEPENDENT_AMBULATORY_CARE_PROVIDER_SITE_OTHER): Payer: 59 | Admitting: Obstetrics and Gynecology

## 2022-03-21 ENCOUNTER — Other Ambulatory Visit: Payer: Self-pay | Admitting: *Deleted

## 2022-03-21 ENCOUNTER — Ambulatory Visit (INDEPENDENT_AMBULATORY_CARE_PROVIDER_SITE_OTHER): Payer: 59 | Admitting: *Deleted

## 2022-03-21 VITALS — Wt 269.0 lb

## 2022-03-21 VITALS — BP 154/81 | HR 82 | Wt 269.0 lb

## 2022-03-21 DIAGNOSIS — Z3480 Encounter for supervision of other normal pregnancy, unspecified trimester: Secondary | ICD-10-CM | POA: Diagnosis not present

## 2022-03-21 DIAGNOSIS — O99213 Obesity complicating pregnancy, third trimester: Secondary | ICD-10-CM

## 2022-03-21 DIAGNOSIS — Z8759 Personal history of other complications of pregnancy, childbirth and the puerperium: Secondary | ICD-10-CM

## 2022-03-21 DIAGNOSIS — O10913 Unspecified pre-existing hypertension complicating pregnancy, third trimester: Secondary | ICD-10-CM

## 2022-03-21 DIAGNOSIS — Z3483 Encounter for supervision of other normal pregnancy, third trimester: Secondary | ICD-10-CM

## 2022-03-21 DIAGNOSIS — Z3A34 34 weeks gestation of pregnancy: Secondary | ICD-10-CM

## 2022-03-21 MED ORDER — FERROUS SULFATE 324 MG PO TBEC: DELAYED_RELEASE_TABLET | ORAL | 2 refills | Status: DC

## 2022-03-21 NOTE — Progress Notes (Addendum)
   PRENATAL VISIT NOTE  Subjective:  Carla Hall is a 26 y.o. G2P1001 at [redacted]w[redacted]d being seen today for ongoing prenatal care.  She is currently monitored for the following issues for this high-risk pregnancy and has Mild intermittent asthma; Supervision of other normal pregnancy, antepartum; History of severe pre-eclampsia; Obesity affecting pregnancy; and Hypertension in pregnancy on their problem list.  Patient reports no complaints.  Contractions: Not present. Vag. Bleeding: None.  Movement: Present. Denies leaking of fluid.   NA, no changes in her vision. Feels well overall.   The following portions of the patient's history were reviewed and updated as appropriate: allergies, current medications, past family history, past medical history, past social history, past surgical history and problem list.   Objective:   Vitals:   03/21/22 1010 03/21/22 1040  BP: (!) 151/82 (!) 154/81  Pulse: 82   Weight: 269 lb (122 kg)     Fetal Status:     Movement: Present     General:  Alert, oriented and cooperative. Patient is in no acute distress.  Skin: Skin is warm and dry. No rash noted.   Cardiovascular: Normal heart rate noted  Respiratory: Normal respiratory effort, no problems with respiration noted  Abdomen: Soft, gravid, appropriate for gestational age.  Pain/Pressure: Absent     Pelvic: Cervical exam deferred        Extremities: Normal range of motion.  Edema: None  Mental Status: Normal mood and affect. Normal behavior. Normal judgment and thought content.   Assessment and Plan:  Pregnancy: G2P1001 at [redacted]w[redacted]d  1. Supervision of other normal pregnancy, antepartum  Was seen last week and labetalol was increased to 200 mg BID No HA, no changes in vision NST today, Reactive NST, no decels, + accels. Baseline 125 bpm  2. History of severe pre-eclampsia  Continue BASA 160 mg until delivery.   3. Pre-existing hypertension during pregnancy in third trimester, unspecified  pre-existing hypertension type  BP elevated today. She is asymptomatic Labs drawn this week. Reassuring.  Increase Labetalol to 300 mg TID Continue antenatal testing and Growth [place] at 36 weeks Induction scheduled for 37 weeks: she is very anxious given her history of Pre E and this is her second medication changed in 1 week.  BP check Tuesday in the office. She will check a home BP reading on Sunday and send me a mychart message.     Preterm labor symptoms and general obstetric precautions including but not limited to vaginal bleeding, contractions, leaking of fluid and fetal movement were reviewed in detail with the patient. Please refer to After Visit Summary for other counseling recommendations.   No follow-ups on file.  Future Appointments  Date Time Provider [place]  03/25/2022  9:30 AM [facility] NURSE [facility]  03/28/2022 10:00 AM [facility] NURSE [facility]  04/01/2022  9:30 AM Tariq Pernell, Artist Pais, NP [facility]  04/04/2022  8:30 AM [facility] NURSE [facility]  04/04/2022  8:45 AM [facility] US6 [facility]  04/08/2022  9:30 AM [facility] NURSE [facility]  04/11/2022  9:30 AM Geovanny Sartin, Artist Pais, NP [facility]  04/11/2022 10:00 AM [facility] NURSE [facility]    Noni Saupe, NP

## 2022-03-21 NOTE — Progress Notes (Signed)
Pt notified of anemia status and per VO D Simpson,CNM Ferrous Sulfate 325 mg sent to pharmacy.

## 2022-03-21 NOTE — Progress Notes (Signed)
NST is reactive and reviewed by J Rasch,NP @ bedside.

## 2022-03-24 ENCOUNTER — Encounter (HOSPITAL_COMMUNITY): Payer: Self-pay | Admitting: *Deleted

## 2022-03-24 ENCOUNTER — Telehealth (HOSPITAL_COMMUNITY): Payer: Self-pay | Admitting: *Deleted

## 2022-03-24 NOTE — Telephone Encounter (Signed)
Preadmission screen  

## 2022-03-25 ENCOUNTER — Ambulatory Visit (INDEPENDENT_AMBULATORY_CARE_PROVIDER_SITE_OTHER): Payer: 59 | Admitting: *Deleted

## 2022-03-25 VITALS — BP 135/75 | HR 98 | Wt 267.0 lb

## 2022-03-25 DIAGNOSIS — O10913 Unspecified pre-existing hypertension complicating pregnancy, third trimester: Secondary | ICD-10-CM | POA: Diagnosis not present

## 2022-03-25 DIAGNOSIS — O10919 Unspecified pre-existing hypertension complicating pregnancy, unspecified trimester: Secondary | ICD-10-CM

## 2022-03-25 DIAGNOSIS — Z3A35 35 weeks gestation of pregnancy: Secondary | ICD-10-CM | POA: Diagnosis not present

## 2022-03-25 DIAGNOSIS — Z8759 Personal history of other complications of pregnancy, childbirth and the puerperium: Secondary | ICD-10-CM

## 2022-03-25 DIAGNOSIS — Z3480 Encounter for supervision of other normal pregnancy, unspecified trimester: Secondary | ICD-10-CM

## 2022-03-25 DIAGNOSIS — O99213 Obesity complicating pregnancy, third trimester: Secondary | ICD-10-CM

## 2022-03-25 NOTE — Progress Notes (Signed)
NST today-Reactive and BP is 135/75

## 2022-03-26 ENCOUNTER — Telehealth (HOSPITAL_COMMUNITY): Payer: Self-pay | Admitting: *Deleted

## 2022-03-26 ENCOUNTER — Encounter (HOSPITAL_COMMUNITY): Payer: Self-pay

## 2022-03-26 NOTE — Telephone Encounter (Signed)
Preadmission screen  

## 2022-03-27 ENCOUNTER — Telehealth (HOSPITAL_COMMUNITY): Payer: Self-pay | Admitting: *Deleted

## 2022-03-27 NOTE — Telephone Encounter (Signed)
Preadmission screen  

## 2022-03-28 ENCOUNTER — Ambulatory Visit (INDEPENDENT_AMBULATORY_CARE_PROVIDER_SITE_OTHER): Payer: 59 | Admitting: *Deleted

## 2022-03-28 ENCOUNTER — Encounter: Payer: 59 | Admitting: Obstetrics and Gynecology

## 2022-03-28 VITALS — BP 146/81 | HR 78

## 2022-03-28 DIAGNOSIS — O10913 Unspecified pre-existing hypertension complicating pregnancy, third trimester: Secondary | ICD-10-CM | POA: Diagnosis not present

## 2022-03-28 DIAGNOSIS — Z8759 Personal history of other complications of pregnancy, childbirth and the puerperium: Secondary | ICD-10-CM

## 2022-03-28 DIAGNOSIS — O99213 Obesity complicating pregnancy, third trimester: Secondary | ICD-10-CM

## 2022-03-28 DIAGNOSIS — O10919 Unspecified pre-existing hypertension complicating pregnancy, unspecified trimester: Secondary | ICD-10-CM

## 2022-03-28 DIAGNOSIS — Z3483 Encounter for supervision of other normal pregnancy, third trimester: Secondary | ICD-10-CM

## 2022-03-28 DIAGNOSIS — Z3480 Encounter for supervision of other normal pregnancy, unspecified trimester: Secondary | ICD-10-CM

## 2022-03-28 NOTE — Progress Notes (Signed)
NST reactive and reviewed by j ZLDJT,TS

## 2022-03-30 ENCOUNTER — Other Ambulatory Visit: Payer: Self-pay | Admitting: Advanced Practice Midwife

## 2022-03-30 ENCOUNTER — Inpatient Hospital Stay (HOSPITAL_COMMUNITY)
Admission: AD | Admit: 2022-03-30 | Discharge: 2022-03-30 | Disposition: A | Discharge status: Home / Self Care | Payer: 59 | Attending: Obstetrics and Gynecology | Admitting: Obstetrics and Gynecology

## 2022-03-30 ENCOUNTER — Encounter (HOSPITAL_COMMUNITY): Payer: Self-pay | Admitting: Obstetrics and Gynecology

## 2022-03-30 DIAGNOSIS — O10913 Unspecified pre-existing hypertension complicating pregnancy, third trimester: Secondary | ICD-10-CM | POA: Diagnosis present

## 2022-03-30 DIAGNOSIS — O09293 Supervision of pregnancy with other poor reproductive or obstetric history, third trimester: Secondary | ICD-10-CM | POA: Diagnosis present

## 2022-03-30 DIAGNOSIS — O212 Late vomiting of pregnancy: Secondary | ICD-10-CM | POA: Diagnosis not present

## 2022-03-30 DIAGNOSIS — O10919 Unspecified pre-existing hypertension complicating pregnancy, unspecified trimester: Secondary | ICD-10-CM

## 2022-03-30 DIAGNOSIS — Z3A35 35 weeks gestation of pregnancy: Secondary | ICD-10-CM | POA: Diagnosis not present

## 2022-03-30 DIAGNOSIS — O99513 Diseases of the respiratory system complicating pregnancy, third trimester: Secondary | ICD-10-CM | POA: Insufficient documentation

## 2022-03-30 DIAGNOSIS — O26893 Other specified pregnancy related conditions, third trimester: Secondary | ICD-10-CM | POA: Insufficient documentation

## 2022-03-30 DIAGNOSIS — Z3689 Encounter for other specified antenatal screening: Secondary | ICD-10-CM

## 2022-03-30 LAB — CBC
HCT: 32.4 % — ABNORMAL LOW (ref 36.0–46.0)
Hemoglobin: 10.9 g/dL — ABNORMAL LOW (ref 12.0–15.0)
MCH: 29.5 pg (ref 26.0–34.0)
MCHC: 33.6 g/dL (ref 30.0–36.0)
MCV: 87.6 fL (ref 80.0–100.0)
Platelets: 299 10*3/uL (ref 150–400)
RBC: 3.7 MIL/uL — ABNORMAL LOW (ref 3.87–5.11)
RDW: 14.1 % (ref 11.5–15.5)
WBC: 11.2 10*3/uL — ABNORMAL HIGH (ref 4.0–10.5)
nRBC: 0 % (ref 0.0–0.2)

## 2022-03-30 LAB — URINALYSIS, ROUTINE W REFLEX MICROSCOPIC
Bilirubin Urine: NEGATIVE
Glucose, UA: NEGATIVE mg/dL
Hgb urine dipstick: NEGATIVE
Ketones, ur: NEGATIVE mg/dL
Nitrite: NEGATIVE
Protein, ur: NEGATIVE mg/dL
Specific Gravity, Urine: 1.01 (ref 1.005–1.030)
pH: 7 (ref 5.0–8.0)

## 2022-03-30 LAB — COMPREHENSIVE METABOLIC PANEL
ALT: 14 U/L (ref 0–44)
AST: 20 U/L (ref 15–41)
Albumin: 3.2 g/dL — ABNORMAL LOW (ref 3.5–5.0)
Alkaline Phosphatase: 99 U/L (ref 38–126)
Anion gap: 12 (ref 5–15)
BUN: 7 mg/dL (ref 6–20)
CO2: 22 mmol/L (ref 22–32)
Calcium: 9.2 mg/dL (ref 8.9–10.3)
Chloride: 103 mmol/L (ref 98–111)
Creatinine, Ser: 0.73 mg/dL (ref 0.44–1.00)
GFR, Estimated: >60 mL/min (ref >60)
Glucose, Bld: 109 mg/dL — ABNORMAL HIGH (ref 70–99)
Potassium: 3.6 mmol/L (ref 3.5–5.1)
Sodium: 137 mmol/L (ref 135–145)
Total Bilirubin: 0.3 mg/dL (ref 0.3–1.2)
Total Protein: 6.6 g/dL (ref 6.5–8.1)

## 2022-03-30 LAB — PROTEIN / CREATININE RATIO, URINE
Creatinine, Urine: 213 mg/dL
Protein Creatinine Ratio: 0.15 mg/mg{Cre} (ref 0.00–0.15)
Total Protein, Urine: 32 mg/dL

## 2022-03-30 LAB — URINALYSIS, MICROSCOPIC (REFLEX): RBC / HPF: NONE SEEN RBC/hpf (ref 0–5)

## 2022-03-30 NOTE — MAU Note (Addendum)
...  Carla Hall is a 26 y.o. at [redacted]w[redacted]d here in MAU reporting: 158/100 this morning at home around 0830 this morning. She reports she then ate and took her Labetalol and it came down to 145/92 around 0930. Endorses heart burn and reports she has been battling this her whole pregnancy. Denies HA, RUQ pain, visual disturbances, and edema. Occasional nausea, light headedness, and dizziness. Denies VB or LOF. +FM.  Labetalol 300 mg TID. Last dose at 0900.  Onset of complaint: 0800 Pain score: Denies pain.    FHT: 127 initial external Lab orders placed from triage:  UA

## 2022-03-30 NOTE — MAU Provider Note (Signed)
History     CSN: [id number]  Arrival date and time: 03/30/22 1034   Event Date/Time   First Provider Initiated Contact with Patient 03/30/22 1150      Chief Complaint  Patient presents with   Hypertension   Nausea   Dizziness   HPI CHRISHA VOGEL is a 26 y.o. G2P1001 at [redacted]w[redacted]d who presents to MAU with chief complaint of elevated blood pressures on her home cuff.  Her pregnancy is c/b Chronic Hypertension managed with Labetalol 300 mg TID. She took her morning dose around 0830. She denies headache, visual disturbances, RUQ/epigastric pain, new onset swelling or weight gain.  Patient reports multiple recent GI complaints including diarrhea (now resolved), nausea and vomiting. The worst of her vomiting occurred overnight. She denies recent vomiting or diarrhea on arrival to MAU. No other wick contacts. She denies fever, dysuria,   Patient also c/o dizziness, new onset. She denies activity intolerance.   [facility]  OB History     Gravida  2   Para  1   Term  1   Preterm  0   AB  0   Living  1      SAB  0   IAB  0   Ectopic  0   Multiple  0   Live Births  1           Past Medical History:  Diagnosis Date   H/O tympanostomy    History of COVID-19    History of pre-eclampsia    Hypertension    Mild intermittent asthma 04/08/2018    Past Surgical History:  Procedure Laterality Date   TYMPANOSTOMY TUBE PLACEMENT     WISDOM TOOTH EXTRACTION      Family History  Problem Relation Age of Onset   Hypertension Mother    Healthy Father    Diabetes Sister    Diabetes Mellitus I Sister    Asthma Neg Hx    Cancer Neg Hx    Heart disease Neg Hx     Social History   Tobacco Use   Smoking status: Never   Smokeless tobacco: Never  Vaping Use   Vaping Use: Never used  Substance Use Topics   Alcohol use: Not Currently    Comment: occasionally   Drug use: Never    Allergies:  Allergies  Allergen Reactions   Penicillins  Rash    Medications Prior to Admission  Medication Sig Dispense Refill Last Dose   aspirin 81 MG chewable tablet Chew 1 tablet (81 mg total) by mouth daily. Start after 12 weeks 60 tablet 3 03/30/2022   doxylamine, Sleep, (UNISOM) 25 MG tablet Take 25 mg by mouth at bedtime as needed.   03/29/2022   ferrous sulfate 324 MG TBEC Take 1 PO every other day with breakfast. 30 tablet 2 03/29/2022   labetalol (NORMODYNE) 100 MG tablet Take 1 tablet (100 mg total) by mouth 3 (three) times daily. 90 tablet 1 03/30/2022   labetalol (NORMODYNE) 200 MG tablet Take 1 tablet (200 mg total) by mouth 2 (two) times daily. 60 tablet 3 03/30/2022   Prenatal Vit-Fe Fumarate-FA (PRENATAL VITAMIN PO) Take by mouth.   03/30/2022   pyridOXINE (VITAMIN B-6) 100 MG tablet Take 100 mg by mouth daily.   03/30/2022   albuterol (PROVENTIL HFA;VENTOLIN HFA) 108 (90 Base) MCG/ACT inhaler Inhale 2 puffs into the lungs every 6 (six) hours as needed for wheezing or shortness of breath. 1 Inhaler 0  Review of Systems  Gastrointestinal:  Positive for nausea.  Neurological:  Positive for dizziness.  All other systems reviewed and are negative.  Physical Exam   Blood pressure 122/78, pulse (!) 106, temperature 98.4 F (36.9 C), temperature source Oral, resp. rate 15, height 5\' 10"  (1.778 m), weight 119.7 kg, last menstrual period 07/22/2021, SpO2 99 %.  Physical Exam Vitals and nursing note reviewed. Exam conducted with a chaperone present.  Constitutional:      Appearance: Normal appearance. She is not ill-appearing.  Cardiovascular:     Rate and Rhythm: Normal rate and regular rhythm.     Pulses: Normal pulses.     Heart sounds: Normal heart sounds.  Pulmonary:     Effort: Pulmonary effort is normal.     Breath sounds: Normal breath sounds.  Abdominal:     Comments: Gravid  Musculoskeletal:     Right lower leg: 1+ Edema present.     Left lower leg: 1+ Edema present.  Skin:    Capillary Refill: Capillary  refill takes less than 2 seconds.  Neurological:     Mental Status: She is alert and oriented to person, place, and time.  Psychiatric:        Mood and Affect: Mood normal.        Behavior: Behavior normal.        Thought Content: Thought content normal.        Judgment: Judgment normal.     MAU Course  Procedures  MDM  --Reactive tracing: baseline 125, mod var, + accels, no decels --Toco: quiet  Orders Placed This Encounter  Procedures   Protein / creatinine ratio, urine   CBC   Comprehensive metabolic panel   Urinalysis, Routine w reflex microscopic Urine, Clean Catch   Urinalysis, Microscopic (reflex)   Measure blood pressure   Discharge patient   Patient Vitals for the past 24 hrs:  BP Temp Temp src Pulse Resp SpO2 Height Weight  03/30/22 1145 123/79 -- -- 98 -- 99 % -- --  03/30/22 1130 114/76 -- -- (!) 106 -- 99 % -- --  03/30/22 1115 122/78 -- -- (!) 106 -- 99 % -- --  03/30/22 1104 128/84 98.4 F (36.9 C) Oral (!) 113 15 99 % -- --  03/30/22 0700 -- -- -- -- -- -- 5\' 10"  (1.778 m) 119.7 kg   Results for orders placed or performed during the hospital encounter of 03/30/22 (from the past 24 hour(s))  Protein / creatinine ratio, urine     Status: None   Collection Time: 03/30/22 10:53 AM  Result Value Ref Range   Creatinine, Urine 213 mg/dL   Total Protein, Urine 32 mg/dL   Protein Creatinine Ratio 0.15 0.00 - 0.15 mg/mg[Cre]  CBC     Status: Abnormal   Collection Time: 03/30/22 10:53 AM  Result Value Ref Range   WBC 11.2 (H) 4.0 - 10.5 K/uL   RBC 3.70 (L) 3.87 - 5.11 MIL/uL   Hemoglobin 10.9 (L) 12.0 - 15.0 g/dL   HCT 32.4 (L) 36.0 - 46.0 %   MCV 87.6 80.0 - 100.0 fL   MCH 29.5 26.0 - 34.0 pg   MCHC 33.6 30.0 - 36.0 g/dL   RDW 14.1 11.5 - 15.5 %   Platelets 299 150 - 400 K/uL   nRBC 0.0 0.0 - 0.2 %  Comprehensive metabolic panel     Status: Abnormal   Collection Time: 03/30/22 10:53 AM  Result Value Ref Range   Sodium 137  135 - 145 mmol/L    Potassium 3.6 3.5 - 5.1 mmol/L   Chloride 103 98 - 111 mmol/L   CO2 22 22 - 32 mmol/L   Glucose, Bld 109 (H) 70 - 99 mg/dL   BUN 7 6 - 20 mg/dL   Creatinine, Ser 8.18 0.44 - 1.00 mg/dL   Calcium 9.2 8.9 - 56.3 mg/dL   Total Protein 6.6 6.5 - 8.1 g/dL   Albumin 3.2 (L) 3.5 - 5.0 g/dL   AST 20 15 - 41 U/L   ALT 14 0 - 44 U/L   Alkaline Phosphatase 99 38 - 126 U/L   Total Bilirubin 0.3 0.3 - 1.2 mg/dL   GFR, Estimated >14 >97 mL/min   Anion gap 12 5 - 15  Urinalysis, Routine w reflex microscopic Urine, Clean Catch     Status: Abnormal   Collection Time: 03/30/22 10:53 AM  Result Value Ref Range   Color, Urine YELLOW YELLOW   APPearance CLEAR CLEAR   Specific Gravity, Urine 1.010 1.005 - 1.030   pH 7.0 5.0 - 8.0   Glucose, UA NEGATIVE NEGATIVE mg/dL   Hgb urine dipstick NEGATIVE NEGATIVE   Bilirubin Urine NEGATIVE NEGATIVE   Ketones, ur NEGATIVE NEGATIVE mg/dL   Protein, ur NEGATIVE NEGATIVE mg/dL   Nitrite NEGATIVE NEGATIVE   Leukocytes,Ua TRACE (A) NEGATIVE  Urinalysis, Microscopic (reflex)     Status: Abnormal   Collection Time: 03/30/22 10:53 AM  Result Value Ref Range   RBC / HPF NONE SEEN 0 - 5 RBC/hpf   WBC, UA 0-5 0 - 5 WBC/hpf   Bacteria, UA FEW (A) NONE SEEN   Squamous Epithelial / LPF 11-20 0 - 5   Assessment and Plan  --26 y.o. G2P1001 at [redacted]w[redacted]d  --CHTN well managed on current regimen --Reactive tracing --Normal PEC labs --Discharge home in stable condition  Calvert Cantor, MSA, MSN, CNM 03/30/2022, 4:30 PM

## 2022-04-01 ENCOUNTER — Other Ambulatory Visit: Payer: 59

## 2022-04-01 ENCOUNTER — Other Ambulatory Visit (HOSPITAL_COMMUNITY)
Admission: RE | Admit: 2022-04-01 | Discharge: 2022-04-01 | Disposition: A | Discharge status: Home / Self Care | Payer: 59 | Source: Ambulatory Visit | Attending: Obstetrics and Gynecology | Admitting: Obstetrics and Gynecology

## 2022-04-01 ENCOUNTER — Ambulatory Visit (INDEPENDENT_AMBULATORY_CARE_PROVIDER_SITE_OTHER): Payer: 59 | Admitting: Obstetrics and Gynecology

## 2022-04-01 VITALS — BP 119/79 | HR 93 | Wt 265.0 lb

## 2022-04-01 DIAGNOSIS — Z3480 Encounter for supervision of other normal pregnancy, unspecified trimester: Secondary | ICD-10-CM

## 2022-04-01 DIAGNOSIS — Z3483 Encounter for supervision of other normal pregnancy, third trimester: Secondary | ICD-10-CM

## 2022-04-01 DIAGNOSIS — O10919 Unspecified pre-existing hypertension complicating pregnancy, unspecified trimester: Secondary | ICD-10-CM | POA: Insufficient documentation

## 2022-04-01 DIAGNOSIS — Z3A36 36 weeks gestation of pregnancy: Secondary | ICD-10-CM

## 2022-04-01 LAB — OB RESULTS CONSOLE GBS: GBS: NEGATIVE

## 2022-04-01 MED ORDER — ASPIRIN 81 MG PO CHEW: 81.0000 mg | CHEWABLE_TABLET | Freq: Every day | ORAL | 3 refills | Status: DC

## 2022-04-01 MED ORDER — LABETALOL HCL 300 MG PO TABS: 300.0000 mg | ORAL_TABLET | Freq: Three times a day (TID) | ORAL | 1 refills | Status: DC

## 2022-04-01 NOTE — Progress Notes (Signed)
   PRENATAL VISIT NOTE  Subjective:  Carla Hall is a 26 y.o. G2P1001 at [redacted]w[redacted]d being seen today for ongoing prenatal care.  She is currently monitored for the following issues for this low-risk pregnancy and has Mild intermittent asthma; Supervision of other normal pregnancy, antepartum; History of severe pre-eclampsia; Obesity affecting pregnancy; Hypertension in pregnancy; and Chronic hypertension affecting pregnancy on their problem list.  Patient reports no complaints.  Contractions: Not present. Vag. Bleeding: None.  Movement: Present. Denies leaking of fluid.   Was seen in MAU over the weekend. Thinks she ate something that did no agree with her stomach. Feeling better. BP has been good over the last few days.   The following portions of the patient's history were reviewed and updated as appropriate: allergies, current medications, past family history, past medical history, past social history, past surgical history and problem list.   Objective:   Vitals:   04/01/22 0933  BP: 119/79  Pulse: 93  Weight: 265 lb (120.2 kg)    Fetal Status: Fetal Heart Rate (bpm): 138 Fundal Height: 37 cm Movement: Present  Presentation: Vertex  General:  Alert, oriented and cooperative. Patient is in no acute distress.  Skin: Skin is warm and dry. No rash noted.   Cardiovascular: Normal heart rate noted  Respiratory: Normal respiratory effort, no problems with respiration noted  Abdomen: Soft, gravid, appropriate for gestational age.  Pain/Pressure: Absent     Pelvic: Cervical exam performed in the presence of a chaperone Dilation: 1 Effacement (%): Thick Station: -3  Extremities: Normal range of motion.  Edema: Trace  Mental Status: Normal mood and affect. Normal behavior. Normal judgment and thought content.   Assessment and Plan:  Pregnancy: G2P1001 at [redacted]w[redacted]d  1. Chronic hypertension affecting pregnancy  - Cervicovaginal ancillary only( [place]) - Culture, Grp B Strep w/Rflx  Suscept  2. Supervision of other normal pregnancy, antepartum  -Induction scheduled for Tuesday next week @ 0700- she will wait for a phone call.  -She is considering coming in Monday afternoon for a foley bulb insertion. She will call to let us know.  RX: Refill on BASA Continue labetalol at current dose: 300 mg TID. BP looks good! Discussed PP BP management and importance of PP BP check in the office.  Pre E precautions.    Preterm labor symptoms and general obstetric precautions including but not limited to vaginal bleeding, contractions, leaking of fluid and fetal movement were reviewed in detail with the patient. Please refer to After Visit Summary for other counseling recommendations.   No follow-ups on file.  Future Appointments  Date Time Provider [place]  04/04/2022  8:30 AM [facility] NURSE [facility] [place]  04/04/2022  8:45 AM [facility] US6 [facility]  04/08/2022  7:00 AM MC-LD [place] None    Noni Saupe, NP

## 2022-04-02 LAB — CERVICOVAGINAL ANCILLARY ONLY
Chlamydia: NEGATIVE
Comment: NEGATIVE
Comment: NORMAL
Neisseria Gonorrhea: NEGATIVE

## 2022-04-04 ENCOUNTER — Ambulatory Visit: Payer: 59 | Admitting: *Deleted

## 2022-04-04 ENCOUNTER — Other Ambulatory Visit: Payer: 59

## 2022-04-04 ENCOUNTER — Ambulatory Visit: Payer: 59 | Attending: Maternal & Fetal Medicine

## 2022-04-04 VITALS — BP 131/75 | HR 99

## 2022-04-04 DIAGNOSIS — O10013 Pre-existing essential hypertension complicating pregnancy, third trimester: Secondary | ICD-10-CM | POA: Diagnosis not present

## 2022-04-04 DIAGNOSIS — O10913 Unspecified pre-existing hypertension complicating pregnancy, third trimester: Secondary | ICD-10-CM

## 2022-04-04 DIAGNOSIS — O99213 Obesity complicating pregnancy, third trimester: Secondary | ICD-10-CM | POA: Insufficient documentation

## 2022-04-04 DIAGNOSIS — E669 Obesity, unspecified: Secondary | ICD-10-CM

## 2022-04-04 DIAGNOSIS — Z3A36 36 weeks gestation of pregnancy: Secondary | ICD-10-CM

## 2022-04-04 DIAGNOSIS — O09293 Supervision of pregnancy with other poor reproductive or obstetric history, third trimester: Secondary | ICD-10-CM

## 2022-04-04 LAB — CULTURE, STREPTOCOCCUS GRP B W/SUSCEPT
MICRO NUMBER:: 14124456
SPECIMEN QUALITY:: ADEQUATE

## 2022-04-07 ENCOUNTER — Inpatient Hospital Stay (HOSPITAL_COMMUNITY): Payer: 59

## 2022-04-07 ENCOUNTER — Ambulatory Visit (INDEPENDENT_AMBULATORY_CARE_PROVIDER_SITE_OTHER): Payer: 59 | Admitting: Obstetrics & Gynecology

## 2022-04-07 VITALS — BP 147/91 | Wt 267.0 lb

## 2022-04-07 DIAGNOSIS — O10913 Unspecified pre-existing hypertension complicating pregnancy, third trimester: Secondary | ICD-10-CM | POA: Diagnosis not present

## 2022-04-07 DIAGNOSIS — Z3A37 37 weeks gestation of pregnancy: Secondary | ICD-10-CM | POA: Diagnosis not present

## 2022-04-07 DIAGNOSIS — O10919 Unspecified pre-existing hypertension complicating pregnancy, unspecified trimester: Secondary | ICD-10-CM

## 2022-04-07 DIAGNOSIS — Z3480 Encounter for supervision of other normal pregnancy, unspecified trimester: Secondary | ICD-10-CM

## 2022-04-07 NOTE — Progress Notes (Signed)
   PRENATAL VISIT NOTE  Subjective:  Carla Hall is a 26 y.o. G2P1001 at [redacted]w[redacted]d being seen today for ongoing prenatal care.  She is currently monitored for the following issues for this high-risk pregnancy and has Mild intermittent asthma; Supervision of other normal pregnancy, antepartum; History of severe pre-eclampsia; Obesity affecting pregnancy; Hypertension in pregnancy; and Chronic hypertension affecting pregnancy on their problem list.  Patient reports no complaints.   .  .   . Denies leaking of fluid.   The following portions of the patient's history were reviewed and updated as appropriate: allergies, current medications, past family history, past medical history, past social history, past surgical history and problem list.   Objective:  Procedure: Patient informed of Risks, benefits, and alternatives of procedure.   Procedure done to begin ripening of the cervix prior to admission for induction of labor. Appropriate time out taken. The patient was placed in the lithotomy position and the cervix brought into view with sterile speculum. A ring forcep was used to guide the 62F foley through the internal os of the cervix. Foley Balloon filled with 60cc of normal saline. Plug inserted into end of the foley. Foley placed on tension and taped to medial thigh. All equipment was removed and accounted for. The patient tolerated the procedure well.   NST:  EFM Baseline: 140 bpm, Variability: Good {> 6 bpm), Accelerations: Reactive, and Decelerations: Absent  Toco: none There were no signs of tachysystole or hypertonus.  Assessment and Plan:   Pregnancy: G2P1001 at [redacted]w[redacted]d  1. Chronic hypertension affecting pregnancy Induction per MDM   Vitals:   04/07/22 1555  Weight: 267 lb (121.1 kg)    Fetal Status:           General:  Alert, oriented and cooperative. Patient is in no acute distress.  Skin: Skin is warm and dry. No rash noted.   Cardiovascular: Normal heart rate noted   Respiratory: Normal respiratory effort, no problems with respiration noted  Abdomen: Soft, gravid, appropriate for gestational age.        Pelvic: Cervical exam performed in the presence of a chaperone      1/60-70/-3  Extremities: Normal range of motion.     Mental Status: Normal mood and affect. Normal behavior. Normal judgment and thought content.   Assessment and Plan:  Pregnancy: G2P1001 at [redacted]w[redacted]d 1. Chronic hypertension affecting pregnancy BP controlled on meds; induction per MFM at 3  2. Supervision of other normal pregnancy, antepartum  S/p Outpatient placement of foley balloon catheter for cervical ripening. Induction of labor scheduled for tomorrow when called. Reassuring FHR tracing with no concerns at present. Cervix was friable and bleeding with digital exam and speculum placement.  Small amount of blood in foley tube.  Watched for 30 mins during NST.  Warning signs given to patient to include return to MAU for heavy vaginal bleeding, Rupture of membranes, painful uterine contractions q 5 mins or less, severe abdominal discomfort, decreased fetal movement.   Future Appointments  Date Time Provider [place]  04/08/2022  7:00 AM MC-LD [place] [facility] None  04/16/2022  9:50 AM Radene Gunning, MD [facility]  05/07/2022  9:30 AM Radene Gunning, MD [facility]    Silas [place], MD

## 2022-04-08 ENCOUNTER — Inpatient Hospital Stay (HOSPITAL_COMMUNITY): Payer: 59 | Admitting: Anesthesiology

## 2022-04-08 ENCOUNTER — Other Ambulatory Visit: Payer: Self-pay

## 2022-04-08 ENCOUNTER — Encounter (HOSPITAL_COMMUNITY): Payer: Self-pay | Admitting: Family Medicine

## 2022-04-08 ENCOUNTER — Inpatient Hospital Stay (HOSPITAL_COMMUNITY)
Admission: RE | Admit: 2022-04-08 | Discharge: 2022-04-09 | DRG: 807 | Disposition: A | Discharge status: Home / Self Care | Payer: 59 | Attending: Family Medicine | Admitting: Family Medicine

## 2022-04-08 ENCOUNTER — Inpatient Hospital Stay (HOSPITAL_COMMUNITY): Payer: 59

## 2022-04-08 ENCOUNTER — Other Ambulatory Visit: Payer: 59

## 2022-04-08 DIAGNOSIS — O99214 Obesity complicating childbirth: Secondary | ICD-10-CM | POA: Diagnosis present

## 2022-04-08 DIAGNOSIS — O10919 Unspecified pre-existing hypertension complicating pregnancy, unspecified trimester: Secondary | ICD-10-CM | POA: Diagnosis present

## 2022-04-08 DIAGNOSIS — Z3A37 37 weeks gestation of pregnancy: Secondary | ICD-10-CM

## 2022-04-08 DIAGNOSIS — O1002 Pre-existing essential hypertension complicating childbirth: Secondary | ICD-10-CM

## 2022-04-08 DIAGNOSIS — O9952 Diseases of the respiratory system complicating childbirth: Secondary | ICD-10-CM | POA: Diagnosis present

## 2022-04-08 DIAGNOSIS — Z8616 Personal history of COVID-19: Secondary | ICD-10-CM | POA: Diagnosis not present

## 2022-04-08 DIAGNOSIS — Z8759 Personal history of other complications of pregnancy, childbirth and the puerperium: Secondary | ICD-10-CM

## 2022-04-08 DIAGNOSIS — O10913 Unspecified pre-existing hypertension complicating pregnancy, third trimester: Secondary | ICD-10-CM

## 2022-04-08 DIAGNOSIS — Z7982 Long term (current) use of aspirin: Secondary | ICD-10-CM | POA: Diagnosis not present

## 2022-04-08 DIAGNOSIS — J4520 Mild intermittent asthma, uncomplicated: Secondary | ICD-10-CM | POA: Diagnosis present

## 2022-04-08 DIAGNOSIS — Z880 Allergy status to penicillin: Secondary | ICD-10-CM | POA: Diagnosis not present

## 2022-04-08 DIAGNOSIS — O99213 Obesity complicating pregnancy, third trimester: Secondary | ICD-10-CM

## 2022-04-08 DIAGNOSIS — Z3480 Encounter for supervision of other normal pregnancy, unspecified trimester: Principal | ICD-10-CM

## 2022-04-08 LAB — TYPE AND SCREEN
ABO/RH(D): A POS
Antibody Screen: NEGATIVE

## 2022-04-08 LAB — CBC
HCT: 30.9 % — ABNORMAL LOW (ref 36.0–46.0)
Hemoglobin: 10.5 g/dL — ABNORMAL LOW (ref 12.0–15.0)
MCH: 29.2 pg (ref 26.0–34.0)
MCHC: 34 g/dL (ref 30.0–36.0)
MCV: 86.1 fL (ref 80.0–100.0)
Platelets: 314 10*3/uL (ref 150–400)
RBC: 3.59 MIL/uL — ABNORMAL LOW (ref 3.87–5.11)
RDW: 14.4 % (ref 11.5–15.5)
WBC: 12.1 10*3/uL — ABNORMAL HIGH (ref 4.0–10.5)
nRBC: 0 % (ref 0.0–0.2)

## 2022-04-08 LAB — PROTEIN / CREATININE RATIO, URINE
Creatinine, Urine: 207 mg/dL
Protein Creatinine Ratio: 0.28 mg/mg{Cre} — ABNORMAL HIGH (ref 0.00–0.15)
Total Protein, Urine: 57 mg/dL

## 2022-04-08 LAB — RPR: RPR Ser Ql: NONREACTIVE

## 2022-04-08 LAB — COMPREHENSIVE METABOLIC PANEL
ALT: 13 U/L (ref 0–44)
AST: 42 U/L — ABNORMAL HIGH (ref 15–41)
Albumin: 3 g/dL — ABNORMAL LOW (ref 3.5–5.0)
Alkaline Phosphatase: 101 U/L (ref 38–126)
Anion gap: 10 (ref 5–15)
BUN: 9 mg/dL (ref 6–20)
CO2: 16 mmol/L — ABNORMAL LOW (ref 22–32)
Calcium: 9.4 mg/dL (ref 8.9–10.3)
Chloride: 110 mmol/L (ref 98–111)
Creatinine, Ser: 0.74 mg/dL (ref 0.44–1.00)
GFR, Estimated: >60 mL/min (ref >60)
Glucose, Bld: 81 mg/dL (ref 70–99)
Potassium: 4.3 mmol/L (ref 3.5–5.1)
Sodium: 136 mmol/L (ref 135–145)
Total Bilirubin: 1 mg/dL (ref 0.3–1.2)
Total Protein: 6.3 g/dL — ABNORMAL LOW (ref 6.5–8.1)

## 2022-04-08 MED ORDER — SENNOSIDES-DOCUSATE SODIUM 8.6-50 MG PO TABS
2.0000 | ORAL_TABLET | Freq: Every day | ORAL | Status: DC
  Administered 2022-04-09: 2 via ORAL
  Filled 2022-04-08: qty 2

## 2022-04-08 MED ORDER — PHENYLEPHRINE 80 MCG/ML (10ML) SYRINGE FOR IV PUSH (FOR BLOOD PRESSURE SUPPORT): 80.0000 ug | PREFILLED_SYRINGE | INTRAVENOUS | Status: DC | PRN

## 2022-04-08 MED ORDER — ZOLPIDEM TARTRATE 5 MG PO TABS: 5.0000 mg | ORAL_TABLET | Freq: Every evening | ORAL | Status: DC | PRN

## 2022-04-08 MED ORDER — OXYCODONE-ACETAMINOPHEN 5-325 MG PO TABS: 2.0000 | ORAL_TABLET | ORAL | Status: DC | PRN

## 2022-04-08 MED ORDER — LABETALOL HCL 200 MG PO TABS
300.0000 mg | ORAL_TABLET | Freq: Three times a day (TID) | ORAL | Status: DC
  Administered 2022-04-09: 300 mg via ORAL
  Filled 2022-04-08: qty 1

## 2022-04-08 MED ORDER — DIBUCAINE (PERIANAL) 1 % EX OINT: 1.0000 | TOPICAL_OINTMENT | CUTANEOUS | Status: DC | PRN

## 2022-04-08 MED ORDER — LACTATED RINGERS IV SOLN: 500.0000 mL | Freq: Once | INTRAVENOUS | Status: DC

## 2022-04-08 MED ORDER — PRENATAL MULTIVITAMIN CH
1.0000 | ORAL_TABLET | Freq: Every day | ORAL | Status: DC
  Administered 2022-04-09: 1 via ORAL
  Filled 2022-04-08: qty 1

## 2022-04-08 MED ORDER — ACETAMINOPHEN 325 MG PO TABS: 650.0000 mg | ORAL_TABLET | ORAL | Status: DC | PRN

## 2022-04-08 MED ORDER — OXYTOCIN-SODIUM CHLORIDE 30-0.9 UT/500ML-% IV SOLN
2.5000 [IU]/h | INTRAVENOUS | Status: DC
  Administered 2022-04-08: 2.5 [IU]/h via INTRAVENOUS
  Filled 2022-04-08: qty 500

## 2022-04-08 MED ORDER — ALBUTEROL SULFATE (2.5 MG/3ML) 0.083% IN NEBU: 3.0000 mL | INHALATION_SOLUTION | Freq: Four times a day (QID) | RESPIRATORY_TRACT | Status: DC | PRN

## 2022-04-08 MED ORDER — TETANUS-DIPHTH-ACELL PERTUSSIS 5-2.5-18.5 LF-MCG/0.5 IM SUSY: 0.5000 mL | PREFILLED_SYRINGE | Freq: Once | INTRAMUSCULAR | Status: DC

## 2022-04-08 MED ORDER — LIDOCAINE HCL (PF) 1 % IJ SOLN
INTRAMUSCULAR | Status: DC | PRN
  Administered 2022-04-08: 10 mL via EPIDURAL
  Administered 2022-04-08: 2 mL via EPIDURAL

## 2022-04-08 MED ORDER — OXYTOCIN BOLUS FROM INFUSION
333.0000 mL | Freq: Once | INTRAVENOUS | Status: AC
  Administered 2022-04-08: 333 mL via INTRAVENOUS

## 2022-04-08 MED ORDER — FENTANYL-BUPIVACAINE-NACL 0.5-0.125-0.9 MG/250ML-% EP SOLN
EPIDURAL | Status: DC | PRN
  Administered 2022-04-08: 12 mL/h via EPIDURAL

## 2022-04-08 MED ORDER — LABETALOL HCL 200 MG PO TABS
300.0000 mg | ORAL_TABLET | Freq: Three times a day (TID) | ORAL | Status: DC
  Administered 2022-04-08 (×2): 300 mg via ORAL
  Filled 2022-04-08 (×2): qty 1

## 2022-04-08 MED ORDER — ACETAMINOPHEN 325 MG PO TABS
650.0000 mg | ORAL_TABLET | ORAL | Status: DC | PRN
  Administered 2022-04-08: 650 mg via ORAL
  Filled 2022-04-08: qty 2

## 2022-04-08 MED ORDER — ONDANSETRON HCL 4 MG/2ML IJ SOLN: 4.0000 mg | Freq: Four times a day (QID) | INTRAMUSCULAR | Status: DC | PRN

## 2022-04-08 MED ORDER — LACTATED RINGERS IV SOLN: 500.0000 mL | INTRAVENOUS | Status: DC | PRN

## 2022-04-08 MED ORDER — DIPHENHYDRAMINE HCL 25 MG PO CAPS: 25.0000 mg | ORAL_CAPSULE | Freq: Four times a day (QID) | ORAL | Status: DC | PRN

## 2022-04-08 MED ORDER — FENTANYL-BUPIVACAINE-NACL 0.5-0.125-0.9 MG/250ML-% EP SOLN
12.0000 mL/h | EPIDURAL | Status: DC | PRN
  Filled 2022-04-08: qty 250

## 2022-04-08 MED ORDER — LIDOCAINE HCL (PF) 1 % IJ SOLN: 30.0000 mL | INTRAMUSCULAR | Status: DC | PRN

## 2022-04-08 MED ORDER — LACTATED RINGERS IV SOLN: INTRAVENOUS | Status: DC

## 2022-04-08 MED ORDER — EPHEDRINE 5 MG/ML INJ: 10.0000 mg | INTRAVENOUS | Status: DC | PRN

## 2022-04-08 MED ORDER — WITCH HAZEL-GLYCERIN EX PADS: 1.0000 | MEDICATED_PAD | CUTANEOUS | Status: DC | PRN

## 2022-04-08 MED ORDER — ONDANSETRON HCL 4 MG/2ML IJ SOLN: 4.0000 mg | INTRAMUSCULAR | Status: DC | PRN

## 2022-04-08 MED ORDER — SIMETHICONE 80 MG PO CHEW: 80.0000 mg | CHEWABLE_TABLET | ORAL | Status: DC | PRN

## 2022-04-08 MED ORDER — ONDANSETRON HCL 4 MG PO TABS: 4.0000 mg | ORAL_TABLET | ORAL | Status: DC | PRN

## 2022-04-08 MED ORDER — OXYTOCIN-SODIUM CHLORIDE 30-0.9 UT/500ML-% IV SOLN
1.0000 m[IU]/min | INTRAVENOUS | Status: DC
  Administered 2022-04-08: 2 m[IU]/min via INTRAVENOUS

## 2022-04-08 MED ORDER — DIPHENHYDRAMINE HCL 50 MG/ML IJ SOLN: 12.5000 mg | INTRAMUSCULAR | Status: DC | PRN

## 2022-04-08 MED ORDER — OXYCODONE-ACETAMINOPHEN 5-325 MG PO TABS: 1.0000 | ORAL_TABLET | ORAL | Status: DC | PRN

## 2022-04-08 MED ORDER — IBUPROFEN 600 MG PO TABS
600.0000 mg | ORAL_TABLET | Freq: Four times a day (QID) | ORAL | Status: DC
  Administered 2022-04-08 – 2022-04-09 (×3): 600 mg via ORAL
  Filled 2022-04-08 (×3): qty 1

## 2022-04-08 MED ORDER — COCONUT OIL OIL: 1.0000 | TOPICAL_OIL | Status: DC | PRN

## 2022-04-08 MED ORDER — TERBUTALINE SULFATE 1 MG/ML IJ SOLN: 0.2500 mg | Freq: Once | INTRAMUSCULAR | Status: DC | PRN

## 2022-04-08 MED ORDER — BENZOCAINE-MENTHOL 20-0.5 % EX AERO: 1.0000 | INHALATION_SPRAY | CUTANEOUS | Status: DC | PRN

## 2022-04-08 MED ORDER — SOD CITRATE-CITRIC ACID 500-334 MG/5ML PO SOLN: 30.0000 mL | ORAL | Status: DC | PRN

## 2022-04-08 NOTE — H&P (Signed)
OBSTETRIC ADMISSION HISTORY AND PHYSICAL  Carla Hall is a 26 y.o. female G2P1001 with IUP at [redacted]w[redacted]d by early U/S presenting for IOL for cHTN.   Reports fetal movement. Denies vaginal bleeding.  She received her prenatal care at  [facility] .  Support person in labor: Jeanne Ivan Anatomy U/S: Normal  EFW (04/04/22): 3249 gm, 7 lb 3 oz, 80%   Prenatal History/Complications: cHTN (on Labetalol 300 mg TID) Obesity affecting pregnancy H/O Severe PEC with last pregnancy  OB BOX: Nursing Staff Provider  Office Location  [place] Dating  Early U/S  [facility] Model [x]  Traditional [ ]  Centering [ ]  Mom-Baby Dyad    Language  English Anatomy US  Normal  Flu Vaccine  02/06/22 Genetic/Carrier Screen  NIPS: low risk girl AFP:  Declined Horizon:Neg  TDaP Vaccine  02/11/22 Hgb A1C or  GTT Early 4.8 Third trimester  Normal.   COVID Vaccine    LAB RESULTS   Rhogam  NA Blood Type A/RH(D) POSITIVE/-- (05/02 0946)   Baby Feeding Plan Breast Antibody NO ANTIBODIES DETECTED (05/02 0946)  Contraception Condoms Rubella 4.77 - IMMUNE (05/02 0946)  Circumcision Yes, if boy  RPR NON-REACTIVE (05/02 0946)   Pediatrician  [facility] HBsAg NON-REACTIVE (05/02 0946)   Support Person Phillip Heal HCVAb Neg  Prenatal Classes  HIV NON-REACTIVE (05/02 0946)     BTL Consent  GBS NEGATIVE (For PCN allergy, check sensitivities)   VBAC Consent  Pap 06/2021 nml       DME Rx [x]  BP cuff [ ]  Weight Scale PHQ9 & GAD7 [x]  new OB [  ] 28 weeks  [  ] 36 weeks Induction  [ ]  Orders Entered [x] Foley Y/N   Past Medical History: Past Medical History:  Diagnosis Date   H/O tympanostomy    History of COVID-19    History of pre-eclampsia    Hypertension    Mild intermittent asthma 04/08/2018    Past Surgical History: Past Surgical History:  Procedure Laterality Date   TYMPANOSTOMY TUBE PLACEMENT     WISDOM TOOTH EXTRACTION      Obstetrical History: OB History     Gravida  2   Para  1   Term  1    Preterm  0   AB  0   Living  1      SAB  0   IAB  0   Ectopic  0   Multiple  0   Live Births  1           Social History: Social History   Socioeconomic History   Marital status: Married    Spouse name: Phillip Heal   Number of children: 1   Years of education: Not on file   Highest education level: Not on file  Occupational History   Occupation: Non Surveyor, minerals: [employer]  Tobacco Use   Smoking status: Never   Smokeless tobacco: Never  Vaping Use   Vaping Use: Never used  Substance and Sexual Activity   Alcohol use: Not Currently    Comment: occasionally   Drug use: Never   Sexual activity: Yes    Partners: Male  Other Topics Concern   Not on file  Social History Narrative   Not on file   Social Determinants of Health   Financial Resource Strain: Not on file  Food Insecurity: No Food Insecurity (04/08/2022)   Hunger Vital Sign    Worried About Running Out of Food  in the Last Year: Never true    [place] in the Last Year: Never true  Transportation Needs: No Transportation Needs (04/08/2022)   PRAPARE - Hydrologist (Medical): No    Lack of Transportation (Non-Medical): No  Physical Activity: Not on file  Stress: Not on file  Social Connections: Unknown (04/08/2018)   Social Connection and Isolation Panel [NHANES]    Frequency of Communication with Friends and Family: Not on file    Frequency of Social Gatherings with Friends and Family: Not on file    Attends Religious Services: Not on file    Active Member of Clubs or Organizations: Not on file    Attends Archivist Meetings: Not on file    Marital Status: Married    Family History: Family History  Problem Relation Age of Onset   Hypertension Mother    Healthy Father    Diabetes Sister    Diabetes Mellitus I Sister    Asthma Neg Hx    Cancer Neg Hx    Heart disease Neg Hx     Allergies: Allergies  Allergen Reactions    Penicillins Rash    Medications Prior to Admission  Medication Sig Dispense Refill Last Dose   aspirin 81 MG chewable tablet Chew 1 tablet (81 mg total) by mouth daily. Start after 12 weeks 60 tablet 3 04/07/2022   doxylamine, Sleep, (UNISOM) 25 MG tablet Take 25 mg by mouth at bedtime as needed.   04/07/2022   ferrous sulfate 324 MG TBEC Take 1 PO every other day with breakfast. 30 tablet 2 04/07/2022   labetalol (NORMODYNE) 300 MG tablet Take 1 tablet (300 mg total) by mouth 3 (three) times daily. 30 tablet 1 04/08/2022   Prenatal Vit-Fe Fumarate-FA (PRENATAL VITAMIN PO) Take by mouth.   04/07/2022   pyridOXINE (VITAMIN B-6) 100 MG tablet Take 100 mg by mouth daily.   04/07/2022   albuterol (PROVENTIL HFA;VENTOLIN HFA) 108 (90 Base) MCG/ACT inhaler Inhale 2 puffs into the lungs every 6 (six) hours as needed for wheezing or shortness of breath. 1 Inhaler 0      Review of Systems  All systems reviewed and negative except as stated in HPI  Blood pressure (!) 142/81, pulse 90, temperature 99.3 F (37.4 C), temperature source Oral, resp. rate 14, height 5\' 10"  (1.778 m), weight 120.4 kg, last menstrual period 07/22/2021. General appearance: alert, cooperative, and no distress Lungs: no respiratory distress Heart: regular rate  Abdomen: soft, non-tender; gravid Pelvic: adequate Extremities: Homans sign is negative, no sign of DVT Presentation: cephalic Fetal monitoring: Baseline rate 125 bpm   Variability moderate  Accelerations present   Decelerations none Uterine activity: regular every 2-3 mins  Dilation: 4.5 Effacement (%): 70 Station: -2 Exam by:: Ignacia Felling, RNC  Prenatal labs: ABO, Rh: --/--/A POS (11/07 7893) Antibody: NEG (11/07 0758) Rubella: 4.77 (05/02 0946) RPR: NON-REACTIVE (09/12 0930)  HBsAg: NON-REACTIVE (05/02 0946)  HIV: NON-REACTIVE (09/12 0930)  GBS: Negative/-- (10/31 0000)  Glucola: Normal Genetic screening:  Normal  Prenatal Transfer Tool  Maternal  Diabetes: No Genetic Screening: Normal Maternal Ultrasounds/Referrals: Normal Fetal Ultrasounds or other Referrals:  None Maternal Substance Abuse:  No Significant Maternal Medications:  Meds include: Other: Labetalol 300 mg TID, bASA, FeSO4 Significant Maternal Lab Results: Group B Strep negative  Results for orders placed or performed during the hospital encounter of 04/08/22 (from the past 24 hour(s))  CBC   Collection Time: 04/08/22  7:58 AM  Result Value Ref Range   WBC 12.1 (H) 4.0 - 10.5 K/uL   RBC 3.59 (L) 3.87 - 5.11 MIL/uL   Hemoglobin 10.5 (L) 12.0 - 15.0 g/dL   HCT 30.9 (L) 36.0 - 46.0 %   MCV 86.1 80.0 - 100.0 fL   MCH 29.2 26.0 - 34.0 pg   MCHC 34.0 30.0 - 36.0 g/dL   RDW 14.4 11.5 - 15.5 %   Platelets 314 150 - 400 K/uL   nRBC 0.0 0.0 - 0.2 %  Comprehensive metabolic panel   Collection Time: 04/08/22  7:58 AM  Result Value Ref Range   Sodium 136 135 - 145 mmol/L   Potassium 4.3 3.5 - 5.1 mmol/L   Chloride 110 98 - 111 mmol/L   CO2 16 (L) 22 - 32 mmol/L   Glucose, Bld 81 70 - 99 mg/dL   BUN 9 6 - 20 mg/dL   Creatinine, Ser 0.74 0.44 - 1.00 mg/dL   Calcium 9.4 8.9 - 10.3 mg/dL   Total Protein 6.3 (L) 6.5 - 8.1 g/dL   Albumin 3.0 (L) 3.5 - 5.0 g/dL   AST 42 (H) 15 - 41 U/L   ALT 13 0 - 44 U/L   Alkaline Phosphatase 101 38 - 126 U/L   Total Bilirubin 1.0 0.3 - 1.2 mg/dL   GFR, Estimated >60 >60 mL/min   Anion gap 10 5 - 15  Type and screen   Collection Time: 04/08/22  7:58 AM  Result Value Ref Range   ABO/RH(D) A POS    Antibody Screen NEG    Sample Expiration      04/11/2022,2359 Performed at [facility], [address], [place], [place] [address]   Protein / creatinine ratio, urine   Collection Time: 04/08/22  8:42 AM  Result Value Ref Range   Creatinine, Urine 207 mg/dL   Total Protein, Urine 57 mg/dL   Protein Creatinine Ratio 0.28 (H) 0.00 - 0.15 mg/mg[Cre]    Patient Active Problem List   Diagnosis Date Noted   Chronic hypertension  affecting pregnancy 04/01/2022   Hypertension in pregnancy 12/26/2021   Supervision of other normal pregnancy, antepartum 10/01/2021   History of severe pre-eclampsia 10/01/2021   Obesity affecting pregnancy 10/01/2021   Mild intermittent asthma 04/08/2018    Assessment/Plan:  MERCEDES WORTHY is a 26 y.o. G2P1001 at [redacted]w[redacted]d here for IOL for cHTN  Labor: IOL -- pain control: epidural  Fetal Wellbeing: EFW 7lbs by Leopold's. Cephalic by SVE.  -- GBS (Neg) -- continuous fetal monitoring - category 1   Postpartum Planning -- breast -- condoms for contraception    Laury Deep, [place]  04/08/2022, 10:00 AM

## 2022-04-08 NOTE — Anesthesia Procedure Notes (Signed)
Epidural Patient location during procedure: OB Start time: 04/08/2022 8:57 AM End time: 04/08/2022 9:05 AM  Staffing Anesthesiologist: Pervis Hocking, DO Performed: anesthesiologist   Preanesthetic Checklist Completed: patient identified, IV checked, risks and benefits discussed, monitors and equipment checked, pre-op evaluation and timeout performed  Epidural Patient position: sitting Prep: DuraPrep and site prepped and draped Patient monitoring: continuous pulse ox, blood pressure, heart rate and cardiac monitor Approach: midline Location: L3-L4 Injection technique: LOR air  Needle:  Needle type: Tuohy  Needle gauge: 17 G Needle length: 9 cm Needle insertion depth: 5 cm Catheter type: closed end flexible Catheter size: 19 Gauge Catheter at skin depth: 10 cm Test dose: negative  Assessment Sensory level: T8 Events: blood not aspirated, injection not painful, no injection resistance, no paresthesia and negative IV test  Additional Notes Patient identified. Risks/Benefits/Options discussed with patient including but not limited to bleeding, infection, nerve damage, paralysis, failed block, incomplete pain control, headache, blood pressure changes, nausea, vomiting, reactions to medication both or allergic, itching and postpartum back pain. Confirmed with bedside nurse the patient's most recent platelet count. Confirmed with patient that they are not currently taking any anticoagulation, have any bleeding history or any family history of bleeding disorders. Patient expressed understanding and wished to proceed. All questions were answered. Sterile technique was used throughout the entire procedure. Please see nursing notes for vital signs. Test dose was given through epidural catheter and negative prior to continuing to dose epidural or start infusion. Warning signs of high block given to the patient including shortness of breath, tingling/numbness in hands, complete motor  block, or any concerning symptoms with instructions to call for help. Patient was given instructions on fall risk and not to get out of bed. All questions and concerns addressed with instructions to call with any issues or inadequate analgesia.  Reason for block:procedure for pain

## 2022-04-08 NOTE — Anesthesia Preprocedure Evaluation (Signed)
Anesthesia Evaluation  Patient identified by MRN, date of birth, ID band Patient awake    Reviewed: Allergy & Precautions, Patient's Chart, lab work & pertinent test results, reviewed documented beta blocker date and time   Airway Mallampati: II  TM Distance: >3 FB Neck ROM: Full    Dental no notable dental hx.    Pulmonary asthma    Pulmonary exam normal breath sounds clear to auscultation       Cardiovascular hypertension (cHTN), Pt. on home beta blockers Normal cardiovascular exam Rhythm:Regular Rate:Normal     Neuro/Psych negative neurological ROS  negative psych ROS   GI/Hepatic negative GI ROS, Neg liver ROS,,,  Endo/Other  negative endocrine ROS    Renal/GU negative Renal ROS  negative genitourinary   Musculoskeletal negative musculoskeletal ROS (+)    Abdominal   Peds negative pediatric ROS (+)  Hematology  (+) Blood dyscrasia, anemia Hb 10.5, plt 314   Anesthesia Other Findings   Reproductive/Obstetrics (+) Pregnancy                             Anesthesia Physical Anesthesia Plan  ASA: 2  Anesthesia Plan: Epidural   Post-op Pain Management:    Induction:   PONV Risk Score and Plan: 2  Airway Management Planned: Natural Airway  Additional Equipment: None  Intra-op Plan:   Post-operative Plan:   Informed Consent: I have reviewed the patients History and Physical, chart, labs and discussed the procedure including the risks, benefits and alternatives for the proposed anesthesia with the patient or authorized representative who has indicated his/her understanding and acceptance.       Plan Discussed with:   Anesthesia Plan Comments:        Anesthesia Quick Evaluation

## 2022-04-08 NOTE — Lactation Note (Signed)
This note was copied from a baby's chart. Lactation Consultation Note  Patient Name: Carla Hall Date: 04/08/2022   Age:28 hours Per RN Sharyn Lull) Birth Parent plans to formula feed only while on MBU and declined [place] services.  Maternal Data    Feeding Nipple Type: Slow - flow  LATCH Score                    Lactation Tools Discussed/Used    Interventions    Discharge    Consult Status Consult Status: Complete    Eulis Canner 04/08/2022, 9:13 PM

## 2022-04-08 NOTE — Discharge Summary (Signed)
Postpartum Discharge Summary  Date of Service updated***     Patient Name: Carla Hall DOB: 24-Jan-1996 MRN: [id number]  Date of admission: 04/08/2022 Delivery date:04/08/2022  Delivering provider: Laury Deep  Date of discharge: 04/08/2022  Admitting diagnosis: Chronic hypertension affecting pregnancy [O10.919] Intrauterine pregnancy: [redacted]w[redacted]d    Secondary diagnosis:  Principal Problem:   Chronic hypertension affecting pregnancy  Additional problems: none    Discharge diagnosis: Term Pregnancy Delivered                                              Post partum procedures:{Postpartum procedures:23558} Augmentation: AROM, Pitocin, and OP Foley Complications: None  Hospital course: Induction of Labor With Vaginal Delivery   26y.o. yo G2P1001 at 332w1das admitted to the hospital 04/08/2022 for induction of labor.  Indication for induction:  cHTN .  Patient had an labor course complicated by none. Membrane Rupture Time/Date: 11:15 AM ,04/08/2022   Delivery Method:Vaginal, Spontaneous  Episiotomy: None  Lacerations:  None  Details of delivery can be found in separate delivery note.  Patient had a postpartum course complicated by***. Patient is discharged home 04/08/22.  Newborn Data: Birth date:04/08/2022  Birth time:4:47 PM  Gender:Female  Living status:Living  Apgars:10 ,10  Weight:2880 g   Magnesium Sulfate received: No BMZ received: No Rhophylac:N/A MMR:N/A T-DaP:Given prenatally - 02/11/2022 Flu: Yes - 02/06/2022 Transfusion:{Transfusion received:30440034}  Physical exam  Vitals:   04/08/22 1716 04/08/22 1731 04/08/22 1746 04/08/22 1801  BP: (!) 146/80 129/68 132/71 129/75  Pulse: 93 90 86 81  Resp:      Temp:      TempSrc:      Weight:      Height:       General: {Exam; general:21111117} Lochia: {Desc; appropriate/inappropriate:30686::"appropriate"} Uterine Fundus: {Desc; firm/soft:30687} Incision: {Exam; incision:21111123} DVT Evaluation: {Exam;  dvt:2111122} Labs: Lab Results  Component Value Date   WBC 12.1 (H) 04/08/2022   HGB 10.5 (L) 04/08/2022   HCT 30.9 (L) 04/08/2022   MCV 86.1 04/08/2022   PLT 314 04/08/2022      Latest Ref Rng & Units 04/08/2022    7:58 AM  CMP  Glucose 70 - 99 mg/dL 81   BUN 6 - 20 mg/dL 9   Creatinine 0.44 - 1.00 mg/dL 0.74   Sodium 135 - 145 mmol/L 136   Potassium 3.5 - 5.1 mmol/L 4.3   Chloride 98 - 111 mmol/L 110   CO2 22 - 32 mmol/L 16   Calcium 8.9 - 10.3 mg/dL 9.4   Total Protein 6.5 - 8.1 g/dL 6.3   Total Bilirubin 0.3 - 1.2 mg/dL 1.0   Alkaline Phos 38 - 126 U/L 101   AST 15 - 41 U/L 42   ALT 0 - 44 U/L 13    Edinburgh Score:    04/13/2020    8:47 AM  Edinburgh Postnatal Depression Scale Screening Tool  I have been able to laugh and see the funny side of things. 0  I have looked forward with enjoyment to things. 0  I have blamed myself unnecessarily when things went wrong. 0  I have been anxious or worried for no good reason. 0  I have felt scared or panicky for no good reason. 0  Things have been getting on top of me. 0  I have been so unhappy that I have had  difficulty sleeping. 0  I have felt sad or miserable. 0  I have been so unhappy that I have been crying. 0  The thought of harming myself has occurred to me. 0  Edinburgh Postnatal Depression Scale Total 0     After visit meds:  Allergies as of 04/08/2022       Reactions   Penicillins Rash     Med Rec must be completed prior to using this [facility]***        Discharge home in stable condition Infant Feeding: Breast Infant Disposition:{CHL IP OB HOME WITH VLRTJW:09927} Discharge instruction: per After Visit Summary and Postpartum booklet. Activity: Advance as tolerated. Pelvic rest for 6 weeks.  Diet: routine diet Future Appointments: Future Appointments  Date Time Provider [place]  04/16/2022  9:50 AM Radene Gunning, MD [facility]  05/07/2022  9:30 AM Radene Gunning, MD [facility]  [facility]   Follow up Visit:  Message sent to [place] by R. Renato Battles, CNM on 04/08/2022 Please schedule this patient for a In person postpartum visit in 6 weeks with the following provider: Any provider. Additional Postpartum F/U:BP check 1 week  Low risk pregnancy complicated by: HTN Delivery mode:  Vaginal, Spontaneous  Anticipated Birth Control:  Condoms   04/08/2022 Laury Deep, CNM

## 2022-04-08 NOTE — Progress Notes (Signed)
Outpatient foley bulb removed on admission.

## 2022-04-09 ENCOUNTER — Inpatient Hospital Stay (HOSPITAL_COMMUNITY): Admission: RE | Admit: 2022-04-09 | Payer: 59 | Source: Home / Self Care | Admitting: Obstetrics & Gynecology

## 2022-04-09 ENCOUNTER — Other Ambulatory Visit (HOSPITAL_COMMUNITY): Payer: Self-pay

## 2022-04-09 LAB — CBC
HCT: 27.1 % — ABNORMAL LOW (ref 36.0–46.0)
Hemoglobin: 9.1 g/dL — ABNORMAL LOW (ref 12.0–15.0)
MCH: 29.4 pg (ref 26.0–34.0)
MCHC: 33.6 g/dL (ref 30.0–36.0)
MCV: 87.4 fL (ref 80.0–100.0)
Platelets: 243 10*3/uL (ref 150–400)
RBC: 3.1 MIL/uL — ABNORMAL LOW (ref 3.87–5.11)
RDW: 14.4 % (ref 11.5–15.5)
WBC: 11.8 10*3/uL — ABNORMAL HIGH (ref 4.0–10.5)
nRBC: 0 % (ref 0.0–0.2)

## 2022-04-09 MED ORDER — IBUPROFEN 600 MG PO TABS
600.0000 mg | ORAL_TABLET | Freq: Four times a day (QID) | ORAL | 0 refills | Status: DC
  Filled 2022-04-09: qty 30, 8d supply, fill #0

## 2022-04-09 MED ORDER — FUROSEMIDE 20 MG PO TABS: 20.0000 mg | ORAL_TABLET | Freq: Every day | ORAL | 0 refills | Status: DC

## 2022-04-09 MED ORDER — ACETAMINOPHEN 325 MG PO TABS
650.0000 mg | ORAL_TABLET | ORAL | 0 refills | Status: DC | PRN
  Filled 2022-04-09: qty 30, 3d supply, fill #0

## 2022-04-09 MED ORDER — NIFEDIPINE ER 60 MG PO TB24: 60.0000 mg | ORAL_TABLET | Freq: Every day | ORAL | 0 refills | Status: DC

## 2022-04-09 MED ORDER — NIFEDIPINE ER OSMOTIC RELEASE 30 MG PO TB24
60.0000 mg | ORAL_TABLET | Freq: Every day | ORAL | Status: DC
  Administered 2022-04-09: 60 mg via ORAL
  Filled 2022-04-09: qty 2

## 2022-04-09 MED ORDER — IBUPROFEN 600 MG PO TABS: 600.0000 mg | ORAL_TABLET | Freq: Four times a day (QID) | ORAL | 0 refills | Status: DC

## 2022-04-09 MED ORDER — FUROSEMIDE 20 MG PO TABS
20.0000 mg | ORAL_TABLET | Freq: Every day | ORAL | Status: DC
  Administered 2022-04-09: 20 mg via ORAL
  Filled 2022-04-09: qty 1

## 2022-04-09 MED ORDER — FUROSEMIDE 20 MG PO TABS
20.0000 mg | ORAL_TABLET | Freq: Every day | ORAL | 0 refills | Status: DC
  Filled 2022-04-09: qty 4, 4d supply, fill #0

## 2022-04-09 MED ORDER — NIFEDIPINE ER 60 MG PO TB24
60.0000 mg | ORAL_TABLET | Freq: Every day | ORAL | 0 refills | Status: DC
  Filled 2022-04-09: qty 30, 30d supply, fill #0

## 2022-04-09 NOTE — Anesthesia Postprocedure Evaluation (Signed)
Anesthesia Post Note  Patient: Carla Hall  Procedure(s) Performed: AN AD HOC LABOR EPIDURAL     Patient location during evaluation: Mother Baby Anesthesia Type: Epidural Level of consciousness: awake, oriented and awake and alert Pain management: pain level controlled Vital Signs Assessment: post-procedure vital signs reviewed and stable Respiratory status: spontaneous breathing, respiratory function stable and nonlabored ventilation Cardiovascular status: stable Postop Assessment: no headache, adequate PO intake, able to ambulate, patient able to bend at knees and no apparent nausea or vomiting Anesthetic complications: no   No notable events documented.  Last Vitals:  Vitals:   04/09/22 0126 04/09/22 0607  BP: 139/71 130/79  Pulse: 83 84  Resp: 17 16  Temp: 36.8 C 36.7 C  SpO2: 99% 100%    Last Pain:  Vitals:   04/09/22 0607  TempSrc: Oral  PainSc: 4    Pain Goal: Patients Stated Pain Goal: 0 (04/09/22 0607)                 Rhesa Forsberg

## 2022-04-11 ENCOUNTER — Encounter: Payer: 59 | Admitting: Obstetrics and Gynecology

## 2022-04-11 ENCOUNTER — Other Ambulatory Visit: Payer: 59

## 2022-04-15 NOTE — Progress Notes (Unsigned)
   GYNECOLOGY OFFICE VISIT NOTE  History:   Kathaleya Mcduffee is a 26 y.o. [phone] here today for BP check postop. She was sent home on Procardia 60 daily and lasix x 5days. She is doing well. Minimal swelling in ankles and feet. No HA/BV/RUQ pain.   She has no s/sx of preeclampsia.      Past Medical History:  Diagnosis Date   H/O tympanostomy    History of COVID-19    History of pre-eclampsia    Hypertension    Mild intermittent asthma 04/08/2018    Past Surgical History:  Procedure Laterality Date   TYMPANOSTOMY TUBE PLACEMENT     WISDOM TOOTH EXTRACTION      The following portions of the patient's history were reviewed and updated as appropriate: allergies, current medications, past family history, past medical history, past social history, past surgical history and problem list.   Health Maintenance:   Diagnosis  Date Value Ref Range Status  06/28/2021   Final   - Negative for intraepithelial lesion or malignancy (NILM)    Review of Systems:  Pertinent items noted in HPI and remainder of comprehensive ROS otherwise negative.  Physical Exam:  BP 127/84   Wt 253 lb (114.8 kg)   LMP 07/22/2021   BMI 36.30 kg/m  CONSTITUTIONAL: Well-developed, well-nourished female in no acute distress.  HEENT:  Normocephalic, atraumatic. External right and left ear normal. No scleral icterus.  NECK: Normal range of motion, supple, no masses noted on observation SKIN: No rash noted. Not diaphoretic. No erythema. No pallor. MUSCULOSKELETAL: Normal range of motion. No edema noted. NEUROLOGIC: Alert and oriented to person, place, and time. Normal muscle tone coordination. No cranial nerve deficit noted. PSYCHIATRIC: Normal mood and affect. Normal behavior. Normal judgment and thought content.  PELVIC: Deferred  Labs and Imaging No results found for this or any previous visit (from the past 168 hour(s)).    Assessment and Plan:   1. Chronic hypertension affecting pregnancy -  Continue Procardia. And at 30 days once rx out, will switch her to 30 mg. Or sooner if symptomatic of low bp.  - Now s/p lasix.  - F/u for routine PP visit on 12/6 with me   Routine preventative health maintenance measures emphasized. Please refer to After Visit Summary for other counseling recommendations.   No follow-ups on file.  Milas Hock, MD, FACOG Obstetrician & Gynecologist, [facility] for [facility], [facility]

## 2022-04-16 ENCOUNTER — Ambulatory Visit (INDEPENDENT_AMBULATORY_CARE_PROVIDER_SITE_OTHER): Payer: 59 | Admitting: Obstetrics and Gynecology

## 2022-04-16 ENCOUNTER — Encounter: Payer: Self-pay | Admitting: Obstetrics and Gynecology

## 2022-04-16 VITALS — BP 127/84 | Wt 253.0 lb

## 2022-04-16 DIAGNOSIS — O1093 Unspecified pre-existing hypertension complicating the puerperium: Secondary | ICD-10-CM

## 2022-04-16 DIAGNOSIS — O10919 Unspecified pre-existing hypertension complicating pregnancy, unspecified trimester: Secondary | ICD-10-CM

## 2022-04-30 ENCOUNTER — Other Ambulatory Visit: Payer: Self-pay

## 2022-04-30 MED ORDER — NIFEDIPINE ER OSMOTIC RELEASE 30 MG PO TB24: 30.0000 mg | ORAL_TABLET | Freq: Every day | ORAL | 1 refills | Status: DC

## 2022-04-30 NOTE — Progress Notes (Signed)
error 

## 2022-04-30 NOTE — Progress Notes (Unsigned)
    Post Partum Visit Note  Carla Hall is a 26 y.o. G2P2002 female who presents for a postpartum visit. She is 4 weeks postpartum following a normal spontaneous vaginal delivery.  I have fully reviewed the prenatal and intrapartum course. The delivery was at 37.1 gestational weeks.  Anesthesia: epidural. Postpartum course has been unremarkable. Baby is doing well. Baby is feeding by breast. Bleeding {vag bleed:12292}. Bowel function is {normal:32111}. Bladder function is {normal:32111}. Patient {is/is not:9024} sexually active. Contraception method is condoms. Postpartum depression screening: negative.   The pregnancy intention screening data noted above was reviewed. Potential methods of contraception were discussed. The patient elected to proceed with No data recorded.    Health Maintenance Due  Topic Date Due   COVID-19 Vaccine (1) Never done    The following portions of the patient's history were reviewed and updated as appropriate: allergies, current medications, past family history, past medical history, past social history, past surgical history, and problem list.  Review of Systems Pertinent items are noted in HPI.  Objective:  LMP 07/22/2021    General:  alert, cooperative, and no distress   Breasts:  not indicated  Lungs: Normal effort  Heart:  Normal rate  Abdomen: soft, non-tender; bowel sounds normal; no masses,  no organomegaly   Wound N/a  GU exam:  not indicated       Assessment:   4 wk postpartum exam.   Plan:   Essential components of care per ACOG recommendations:  1.  Mood and well being: Patient with {gen negative/positive:315881} depression screening today. Reviewed local resources for support.  - Patient tobacco use? No.   - hx of drug use? No.    2. Infant care and feeding:  -Patient currently breastmilk feeding? Yes. Reviewed importance of draining breast regularly to support lactation.  -Social determinants of health (SDOH) reviewed in  EPIC. No concerns  3. Sexuality, contraception and birth spacing - Patient {DOES_DOES JHE:17408} want a pregnancy in the next year.  Desired family size is {NUMBER 1-10:22536} children.  - Reviewed reproductive life planning. Reviewed contraceptive methods based on pt preferences and effectiveness.  Patient desired {Upstream End Methods:24109} today.   - Discussed birth spacing of 18 months  4. Sleep and fatigue -Encouraged family/partner/community support of 4 hrs of uninterrupted sleep to help with mood and fatigue  5. Physical Recovery  - Discussed patients delivery and complications. She describes her labor as good. - Patient had a Vaginal, no problems at delivery. Patient had no laceration.  - Patient has urinary incontinence? {yes/no:25515} - Patient is safe to resume physical and sexual activity  6.  Health Maintenance - HM due items addressed Yes - Last pap smear  Diagnosis  Date Value Ref Range Status  06/28/2021   Final   - Negative for intraepithelial lesion or malignancy (NILM)   Pap smear not done at today's visit.  -Breast Cancer screening indicated? No.   7. Chronic Disease/Pregnancy Condition follow up: Hypertension - Continue Procardia 30 XL and make f/u with PCP office down the hall *** - PCP follow up  Kathie Dike, CMA Center for [employer], [facility]

## 2022-05-07 ENCOUNTER — Telehealth: Payer: Self-pay

## 2022-05-07 ENCOUNTER — Ambulatory Visit (INDEPENDENT_AMBULATORY_CARE_PROVIDER_SITE_OTHER): Payer: 59 | Admitting: Obstetrics and Gynecology

## 2022-05-07 ENCOUNTER — Encounter: Payer: Self-pay | Admitting: Obstetrics and Gynecology

## 2022-05-07 NOTE — Telephone Encounter (Signed)
error 

## 2022-05-26 ENCOUNTER — Other Ambulatory Visit: Payer: Self-pay | Admitting: Obstetrics and Gynecology

## 2022-08-19 DIAGNOSIS — E663 Overweight: Secondary | ICD-10-CM | POA: Diagnosis not present

## 2022-08-19 DIAGNOSIS — R5383 Other fatigue: Secondary | ICD-10-CM | POA: Diagnosis not present

## 2022-08-19 DIAGNOSIS — I10 Essential (primary) hypertension: Secondary | ICD-10-CM | POA: Diagnosis not present

## 2022-08-19 DIAGNOSIS — E559 Vitamin D deficiency, unspecified: Secondary | ICD-10-CM | POA: Diagnosis not present

## 2023-01-13 ENCOUNTER — Encounter: Payer: Self-pay | Admitting: *Deleted

## 2023-01-13 DIAGNOSIS — Z3480 Encounter for supervision of other normal pregnancy, unspecified trimester: Secondary | ICD-10-CM | POA: Insufficient documentation

## 2023-01-20 ENCOUNTER — Ambulatory Visit (INDEPENDENT_AMBULATORY_CARE_PROVIDER_SITE_OTHER): Payer: 59 | Admitting: Obstetrics and Gynecology

## 2023-01-20 ENCOUNTER — Other Ambulatory Visit (INDEPENDENT_AMBULATORY_CARE_PROVIDER_SITE_OTHER): Payer: 59

## 2023-01-20 VITALS — BP 141/84 | HR 88 | Wt 240.0 lb

## 2023-01-20 DIAGNOSIS — O10911 Unspecified pre-existing hypertension complicating pregnancy, first trimester: Secondary | ICD-10-CM | POA: Diagnosis not present

## 2023-01-20 DIAGNOSIS — Z1339 Encounter for screening examination for other mental health and behavioral disorders: Secondary | ICD-10-CM | POA: Diagnosis not present

## 2023-01-20 DIAGNOSIS — O10919 Unspecified pre-existing hypertension complicating pregnancy, unspecified trimester: Secondary | ICD-10-CM

## 2023-01-20 DIAGNOSIS — Z3481 Encounter for supervision of other normal pregnancy, first trimester: Secondary | ICD-10-CM

## 2023-01-20 DIAGNOSIS — Z3A10 10 weeks gestation of pregnancy: Secondary | ICD-10-CM

## 2023-01-20 DIAGNOSIS — Z8759 Personal history of other complications of pregnancy, childbirth and the puerperium: Secondary | ICD-10-CM

## 2023-01-20 DIAGNOSIS — Z3480 Encounter for supervision of other normal pregnancy, unspecified trimester: Secondary | ICD-10-CM

## 2023-01-20 MED ORDER — ASPIRIN 162.5 MG PO CP24: 1.0000 | ORAL_CAPSULE | Freq: Every day | ORAL | 2 refills | Status: DC

## 2023-01-20 MED ORDER — LABETALOL HCL 100 MG PO TABS: 100.0000 mg | ORAL_TABLET | Freq: Two times a day (BID) | ORAL | 1 refills | Status: DC

## 2023-01-20 MED ORDER — ONDANSETRON HCL 8 MG PO TABS: 8.0000 mg | ORAL_TABLET | Freq: Three times a day (TID) | ORAL | 1 refills | Status: AC | PRN

## 2023-01-20 NOTE — Progress Notes (Signed)
History:   Carla Hall is a 27 y.o. G3P2002 at [redacted]w[redacted]d by LMP being seen today for her first obstetrical visit.  Her obstetrical history is significant for obesity and essential HTN . Patient does intend to breast feed. Pregnancy history fully reviewed.  G1: severe preeclampsia on magnesium.  G2: Started Labetalol @ 22 weeks. Delivered @37  weeks without preeclampsia.   Patient reports no complaints.   HISTORY: OB History  Gravida Para Term Preterm AB Living  3 2 2  0 0 2  SAB IAB Ectopic Multiple Live Births  0 0 0 0 2    # Outcome Date GA Lbr Len/2nd Weight Sex Type Anes PTL Lv  3 Current           2 Term 04/08/22 [redacted]w[redacted]d 05:28 / 00:04 6 lb 5.6 oz (2.88 kg) F Vag-Spont EPI  LIV     Birth Comments: [id number]     Name: Carla Hall,Carla Hall     Apgar1: 10  Apgar5: 10  1 Term 03/02/20 [redacted]w[redacted]d / 02:01 7 lb 10.6 oz (3.476 kg) M Vag-Spont EPI  LIV     Name: Carla Hall     Apgar1: 8  Apgar5: 9    Last pap smear was done 2023 and was normal  Past Medical History:  Diagnosis Date   H/O tympanostomy    History of COVID-19    History of pre-eclampsia    Hypertension    Mild intermittent asthma 04/08/2018   Past Surgical History:  Procedure Laterality Date   TYMPANOSTOMY TUBE PLACEMENT     WISDOM TOOTH EXTRACTION     Family History  Problem Relation Age of Onset   Hypertension Mother    Healthy Father    Diabetes Sister    Diabetes Mellitus I Sister    Asthma Neg Hx    Cancer Neg Hx    Heart disease Neg Hx    Social History   Tobacco Use   Smoking status: Never   Smokeless tobacco: Never  Vaping Use   Vaping status: Never Used  Substance Use Topics   Alcohol use: Not Currently    Comment: occasionally   Drug use: Never   Allergies  Allergen Reactions   Penicillins Rash   Current Outpatient Medications on File Prior to Visit  Medication Sig Dispense Refill   albuterol (PROVENTIL HFA;VENTOLIN HFA) 108 (90 Base) MCG/ACT inhaler Inhale 2 puffs into the  lungs every 6 (six) hours as needed for wheezing or shortness of breath. 1 Inhaler 0   cetirizine (ZYRTEC) 5 MG chewable tablet Chew 5 mg by mouth daily.     doxylamine, Sleep, (UNISOM) 25 MG tablet Take 25 mg by mouth at bedtime as needed.     Prenatal Vit-Fe Fumarate-FA (PRENATAL VITAMIN PO) Take 1 tablet by mouth daily.     pyridOXINE (B-6) 50 MG tablet Take 50 mg by mouth daily.     No current facility-administered medications on file prior to visit.    Review of Systems Pertinent items noted in HPI and remainder of comprehensive ROS otherwise negative.  Indications for ASA therapy (per UpToDate) One of the following: Previous pregnancy with preeclampsia, especially early onset and with an adverse outcome Yes Multifetal gestation No Chronic hypertension Yes Type 1 or 2 diabetes mellitus No Chronic kidney disease No Autoimmune disease (antiphospholipid syndrome, systemic lupus erythematosus) No Two or more of the following: Nulliparity No Obesity (body mass index >30 kg/m2) Yes Family history of preeclampsia in mother or sister No Age ?35  years No Sociodemographic characteristics (African American race, low socioeconomic level) No Personal risk factors (eg, previous pregnancy with low birth weight or small for gestational age infant, previous adverse pregnancy outcome [eg, stillbirth], interval >10 years between pregnancies) No  Indications for early GDM screening (FBS, A1C, Random CBG, GTT) First-degree relative with diabetes No BMI >30kg/m2 Yes Age > 35 No Previous birth of an infant weighing ?4000 g No Gestational diabetes mellitus in a previous pregnancy NO Glycated hemoglobin ?5.7 percent (39 mmol/mol), impaired glucose tolerance, or impaired fasting glucose on previous testing No High-risk race/ethnicity (eg, African American, Latino, Native American, Asian American, Pacific Islander) No Previous stillbirth of unknown cause No Maternal birthweight > 9 lbs No History of  cardiovascular disease Yes Hypertension or on therapy for hypertension Yes High-density lipoprotein cholesterol level <35 mg/dL (2.13 mmol/L) and/or a triglyceride level >250 mg/dL (0.86 mmol/L) No Polycystic ovary syndrome No Physical inactivity No Other clinical condition associated with insulin resistance (eg, severe obesity, acanthosis nigricans) No Current use of glucocorticoids No   Physical Exam:   Vitals:   01/20/23 0933  BP: (!) 141/84  Pulse: 88  Weight: 240 lb (108.9 kg)     General: well-developed, well-nourished female in no acute distress  Breasts:  deferred  Skin: normal coloration and turgor, no rashes  Neurologic: oriented, normal, negative, normal mood  Extremities: normal strength, tone, and muscle mass, ROM of all joints is normal  HEENT PERRLA, extraocular movement intact and sclera clear, anicteric  Neck supple and no masses  Cardiovascular: regular rate and rhythm  Respiratory:  no respiratory distress, normal breath sounds  Abdomen: soft, non-tender; bowel sounds normal; no masses,  no organomegaly  Pelvic: deferred    Assessment:    Pregnancy: [id number] Patient Active Problem List   Diagnosis Date Noted   Supervision of other normal pregnancy, antepartum 01/13/2023   Chronic hypertension affecting pregnancy 04/01/2022   History of severe pre-eclampsia 10/01/2021   Obesity affecting pregnancy 10/01/2021   Mild intermittent asthma 04/08/2018     Plan:   1. Supervision of other normal pregnancy, antepartum  - US OB Limited; Future - [place] MFM OB DETAIL +14 WK; Future - Pregnancy, Initial Screen - Babyscripts Schedule Optimization - HgB A1c  2. Encounter for supervision of other normal pregnancy in first trimester  - PANORAMA PRENATAL TEST - Comp Met (CMET) - Protein / creatinine ratio, urine  3. Chronic hypertension affecting pregnancy  - Start Labetalol 100 mg BID - Start ASA 162 mg.  - Rx sent  - Growth [place] every 4 weeks starting at 24  weeks. - Antenatal testing starting at 32 weeks.   4. History of severe pre-eclampsia  - Baseline labs collected including PCR.    Initial labs drawn. Continue prenatal vitamins. Problem list reviewed and updated. Genetic Screening discussed, Panorama and Horizon: requested. Ultrasound discussed; fetal anatomic survey: scheduled. Anticipatory guidance about prenatal visits given including labs, ultrasounds, and testing. Weight gain recommendations per IOM guidelines reviewed: underweight/BMI 18.5 or less > 28 - 40 lbs; normal weight/BMI 18.5 - 24.9 > 25 - 35 lbs; overweight/BMI 25 - 29.9 > 15 - 25 lbs; obese/BMI  30 or more > 11 - 20 lbs. Discussed usage of the Babyscripts app for more information about pregnancy, and to track blood pressures. Also discussed usage of virtual visits as additional source of managing and completing prenatal visits.  Patient was encouraged to use MyChart to review results, send requests, and have questions addressed.   The nature of  [facility] with multiple MDs and Advanced Practice Providers was explained to patient; also emphasized that residents, students are part of our team. Routine obstetric precautions reviewed. Encouraged to seek out care at our office or emergency room [facility] preferred) for urgent and/or emergent concerns. No follow-ups on file.     Jonmarc Bodkin, Harolyn Rutherford, NP Faculty Practice [facility] [employer], [facility]

## 2023-01-21 LAB — PROTEIN / CREATININE RATIO, URINE
Creatinine, Urine: 144.9 mg/dL
Protein, Ur: 10.1 mg/dL
Protein/Creat Ratio: 70 mg/g{creat} (ref 0–200)

## 2023-01-21 LAB — COMPREHENSIVE METABOLIC PANEL
ALT: 14 IU/L (ref 0–32)
AST: 14 IU/L (ref 0–40)
Albumin: 4 g/dL (ref 4.0–5.0)
Alkaline Phosphatase: 62 IU/L (ref 44–121)
BUN/Creatinine Ratio: 14 (ref 9–23)
BUN: 8 mg/dL (ref 6–20)
Bilirubin Total: 0.2 mg/dL (ref 0.0–1.2)
CO2: 20 mmol/L (ref 20–29)
Calcium: 9.3 mg/dL (ref 8.7–10.2)
Chloride: 102 mmol/L (ref 96–106)
Creatinine, Ser: 0.56 mg/dL — ABNORMAL LOW (ref 0.57–1.00)
Globulin, Total: 2.6 g/dL (ref 1.5–4.5)
Glucose: 68 mg/dL — ABNORMAL LOW (ref 70–99)
Potassium: 4 mmol/L (ref 3.5–5.2)
Sodium: 135 mmol/L (ref 134–144)
Total Protein: 6.6 g/dL (ref 6.0–8.5)
eGFR: 127 mL/min/{1.73_m2} (ref >59)

## 2023-01-21 LAB — HEMOGLOBIN A1C
Est. average glucose Bld gHb Est-mCnc: 91 mg/dL
Hgb A1c MFr Bld: 4.8 % (ref 4.8–5.6)

## 2023-01-22 LAB — MICROSCOPIC EXAMINATION
Casts: NONE SEEN /LPF
Epithelial Cells (non renal): >10 /HPF — AB (ref 0–10)
WBC, UA: NONE SEEN /HPF (ref 0–5)

## 2023-01-22 LAB — PREGNANCY, INITIAL SCREEN
Antibody Screen: NEGATIVE
Basophils Absolute: 0.1 10*3/uL (ref 0.0–0.2)
Basos: 1 %
Bilirubin, UA: NEGATIVE
Chlamydia trachomatis, NAA: NEGATIVE
EOS (ABSOLUTE): 0.1 10*3/uL (ref 0.0–0.4)
Eos: 1 %
Glucose, UA: NEGATIVE
HCV Ab: NONREACTIVE
HIV Screen 4th Generation wRfx: NONREACTIVE
Hematocrit: 38 % (ref 34.0–46.6)
Hemoglobin: 12.6 g/dL (ref 11.1–15.9)
Hepatitis B Surface Ag: NEGATIVE
Immature Grans (Abs): 0 10*3/uL (ref 0.0–0.1)
Immature Granulocytes: 0 %
Ketones, UA: NEGATIVE
Lymphocytes Absolute: 2.1 10*3/uL (ref 0.7–3.1)
Lymphs: 23 %
MCH: 28.4 pg (ref 26.6–33.0)
MCHC: 33.2 g/dL (ref 31.5–35.7)
MCV: 86 fL (ref 79–97)
Monocytes Absolute: 0.4 10*3/uL (ref 0.1–0.9)
Monocytes: 4 %
Neisseria Gonorrhoeae by PCR: NEGATIVE
Neutrophils Absolute: 6.4 10*3/uL (ref 1.4–7.0)
Neutrophils: 71 %
Nitrite, UA: NEGATIVE
Platelets: 301 10*3/uL (ref 150–450)
RBC, UA: NEGATIVE
RBC: 4.44 x10E6/uL (ref 3.77–5.28)
RDW: 13.2 % (ref 11.7–15.4)
RPR Ser Ql: NONREACTIVE
Rh Factor: POSITIVE
Rubella Antibodies, IGG: 6.19 {index} (ref >0.99)
Specific Gravity, UA: 1.024 (ref 1.005–1.030)
Urobilinogen, Ur: 0.2 mg/dL (ref 0.2–1.0)
WBC: 9.1 10*3/uL (ref 3.4–10.8)
pH, UA: 6.5 (ref 5.0–7.5)

## 2023-01-22 LAB — HCV INTERPRETATION

## 2023-01-22 LAB — URINE CULTURE, OB REFLEX

## 2023-01-30 DIAGNOSIS — Z3481 Encounter for supervision of other normal pregnancy, first trimester: Secondary | ICD-10-CM | POA: Diagnosis not present

## 2023-02-08 LAB — PANORAMA PRENATAL TEST FULL PANEL:PANORAMA TEST PLUS 5 ADDITIONAL MICRODELETIONS: FETAL FRACTION: 4.8

## 2023-02-17 ENCOUNTER — Other Ambulatory Visit (HOSPITAL_COMMUNITY)
Admission: RE | Admit: 2023-02-17 | Discharge: 2023-02-17 | Disposition: A | Discharge status: Home / Self Care | Payer: 59 | Source: Ambulatory Visit | Attending: Obstetrics and Gynecology | Admitting: Obstetrics and Gynecology

## 2023-02-17 ENCOUNTER — Ambulatory Visit (INDEPENDENT_AMBULATORY_CARE_PROVIDER_SITE_OTHER): Payer: 59 | Admitting: Obstetrics and Gynecology

## 2023-02-17 VITALS — BP 121/79 | HR 101 | Wt 242.0 lb

## 2023-02-17 DIAGNOSIS — Z3A14 14 weeks gestation of pregnancy: Secondary | ICD-10-CM

## 2023-02-17 DIAGNOSIS — N898 Other specified noninflammatory disorders of vagina: Secondary | ICD-10-CM

## 2023-02-17 DIAGNOSIS — Z3482 Encounter for supervision of other normal pregnancy, second trimester: Secondary | ICD-10-CM

## 2023-02-17 DIAGNOSIS — N949 Unspecified condition associated with female genital organs and menstrual cycle: Secondary | ICD-10-CM | POA: Insufficient documentation

## 2023-02-17 DIAGNOSIS — Z3480 Encounter for supervision of other normal pregnancy, unspecified trimester: Secondary | ICD-10-CM

## 2023-02-17 LAB — POCT URINALYSIS DIPSTICK
Bilirubin, UA: NEGATIVE
Blood, UA: NEGATIVE
Glucose, UA: NEGATIVE
Ketones, UA: NEGATIVE
Nitrite, UA: NEGATIVE
Protein, UA: NEGATIVE
Spec Grav, UA: 1.02 (ref 1.010–1.025)
Urobilinogen, UA: 0.2 U/dL
pH, UA: 7 (ref 5.0–8.0)

## 2023-02-17 NOTE — Progress Notes (Signed)
   PRENATAL VISIT NOTE  Subjective:  Carla Hall is a 27 y.o. G3P2002 at [redacted]w[redacted]d being seen today for ongoing prenatal care.  She is currently monitored for the following issues for this low-risk pregnancy and has Mild intermittent asthma; History of severe pre-eclampsia; Obesity affecting pregnancy; Chronic hypertension affecting pregnancy; and Supervision of other normal pregnancy, antepartum on their problem list.  Patient reports no complaints.  Contractions: Not present. Vag. Bleeding: None.  Movement: Absent. Denies leaking of fluid.   The following portions of the patient's history were reviewed and updated as appropriate: allergies, current medications, past family history, past medical history, past social history, past surgical history and problem list.   Objective:   Vitals:   02/17/23 0803  BP: 121/79  Pulse: (!) 101  Weight: 242 lb (109.8 kg)    Fetal Status: Fetal Heart Rate (bpm): 155   Movement: Absent     General:  Alert, oriented and cooperative. Patient is in no acute distress.  Skin: Skin is warm and dry. No rash noted.   Cardiovascular: Normal heart rate noted  Respiratory: Normal respiratory effort, no problems with respiration noted  Abdomen: Soft, gravid, appropriate for gestational age.  Pain/Pressure: Absent     Pelvic: Cervical exam deferred        Extremities: Normal range of motion.  Edema: None  Mental Status: Normal mood and affect. Normal behavior. Normal judgment and thought content.   Assessment and Plan:    1. Vaginal itching  - Cervicovaginal ancillary only( [place]) - POCT Urinalysis Dipstick - Culture, OB Urine  2. Vaginal burning  - Cervicovaginal ancillary only( [place]) - POCT Urinalysis Dipstick - Culture, OB Urine  3. Supervision of other normal pregnancy, antepartum  BP good today Baseline labs done Start BASA 162 mg Continue labetalol    Preterm labor symptoms and general obstetric precautions including  but not limited to vaginal bleeding, contractions, leaking of fluid and fetal movement were reviewed in detail with the patient. Please refer to After Visit Summary for other counseling recommendations.   No follow-ups on file.  Future Appointments  Date Time Provider Department Center  03/17/2023  9:10 AM Alie Hardgrove, Harolyn Rutherford, NP [facility]  03/25/2023  8:15 AM [facility] NURSE [facility]  03/25/2023  8:30 AM [facility] US3 [facility]    Venia Carbon, NP

## 2023-02-19 LAB — URINE CULTURE, OB REFLEX

## 2023-02-19 LAB — CULTURE, OB URINE

## 2023-02-20 ENCOUNTER — Other Ambulatory Visit: Payer: Self-pay | Admitting: *Deleted

## 2023-02-20 MED ORDER — TERCONAZOLE 0.4 % VA CREA: 1.0000 | TOPICAL_CREAM | Freq: Every day | VAGINAL | 0 refills | Status: DC

## 2023-02-20 NOTE — Progress Notes (Signed)
RX sent into pharmacy for Terazol 7 vaginal cream per J Rasch,NP

## 2023-03-13 DIAGNOSIS — O09899 Supervision of other high risk pregnancies, unspecified trimester: Secondary | ICD-10-CM | POA: Insufficient documentation

## 2023-03-17 ENCOUNTER — Ambulatory Visit (INDEPENDENT_AMBULATORY_CARE_PROVIDER_SITE_OTHER): Payer: 59 | Admitting: Obstetrics and Gynecology

## 2023-03-17 VITALS — BP 122/79 | HR 87 | Wt 254.0 lb

## 2023-03-17 DIAGNOSIS — O10919 Unspecified pre-existing hypertension complicating pregnancy, unspecified trimester: Secondary | ICD-10-CM

## 2023-03-17 DIAGNOSIS — Z23 Encounter for immunization: Secondary | ICD-10-CM | POA: Diagnosis not present

## 2023-03-17 DIAGNOSIS — Z3480 Encounter for supervision of other normal pregnancy, unspecified trimester: Secondary | ICD-10-CM

## 2023-03-17 DIAGNOSIS — Z8759 Personal history of other complications of pregnancy, childbirth and the puerperium: Secondary | ICD-10-CM

## 2023-03-17 NOTE — Progress Notes (Signed)
   PRENATAL VISIT NOTE  Subjective:  Carla Hall is a 27 y.o. G3P2002 at [redacted]w[redacted]d being seen today for ongoing prenatal care.  She is currently monitored for the following issues for this high-risk pregnancy and has Mild intermittent asthma; History of severe pre-eclampsia; Obesity affecting pregnancy; Chronic hypertension affecting pregnancy; Supervision of other normal pregnancy, antepartum; and Short interval between pregnancies affecting pregnancy, antepartum on their problem list.  Patient reports no complaints.   . Vag. Bleeding: None.  Movement: Present. Denies leaking of fluid.   The following portions of the patient's history were reviewed and updated as appropriate: allergies, current medications, past family history, past medical history, past social history, past surgical history and problem list.   Objective:   Vitals:   03/17/23 0922  BP: 122/79  Pulse: 87  Weight: 254 lb (115.2 kg)    Fetal Status:     Movement: Present     General:  Alert, oriented and cooperative. Patient is in no acute distress.  Skin: Skin is warm and dry. No rash noted.   Cardiovascular: Normal heart rate noted  Respiratory: Normal respiratory effort, no problems with respiration noted  Abdomen: Soft, gravid, appropriate for gestational age.  Pain/Pressure: Absent     Pelvic: Cervical exam deferred        Extremities: Normal range of motion.  Edema: None  Mental Status: Normal mood and affect. Normal behavior. Normal judgment and thought content.   Assessment and Plan:  Pregnancy: G3P2002 at [redacted]w[redacted]d  1. Chronic hypertension affecting pregnancy  Doing well on labetalol 100 mg BID  BP normal today, BP normal at home.   2. History of severe pre-eclampsia BASA 162 mg daily   3. Supervision of other normal pregnancy, antepartum  Doing well Bedside US today with active fetus.  No concerns Declined AFP   Preterm labor symptoms and general obstetric precautions including but not limited  to vaginal bleeding, contractions, leaking of fluid and fetal movement were reviewed in detail with the patient. Please refer to After Visit Summary for other counseling recommendations.   No follow-ups on file.  Future Appointments  Date Time Provider Department Center  03/25/2023  8:15 AM [facility] NURSE [facility]  03/25/2023  8:30 AM [facility] US3 [facility]    Venia Carbon, NP

## 2023-03-17 NOTE — Addendum Note (Signed)
Addended by: Harrel Lemon on: 03/17/2023 10:24 AM   Modules accepted: Orders

## 2023-03-17 NOTE — Progress Notes (Signed)
ROB 18 wks Declines AFP Anatomy scan scheduled 10/23

## 2023-03-25 ENCOUNTER — Other Ambulatory Visit: Payer: Self-pay

## 2023-03-25 ENCOUNTER — Ambulatory Visit: Payer: 59 | Attending: Obstetrics and Gynecology

## 2023-03-25 ENCOUNTER — Ambulatory Visit: Payer: 59

## 2023-03-25 ENCOUNTER — Other Ambulatory Visit: Payer: Self-pay | Admitting: *Deleted

## 2023-03-25 DIAGNOSIS — O99212 Obesity complicating pregnancy, second trimester: Secondary | ICD-10-CM | POA: Diagnosis not present

## 2023-03-25 DIAGNOSIS — O10912 Unspecified pre-existing hypertension complicating pregnancy, second trimester: Secondary | ICD-10-CM | POA: Diagnosis not present

## 2023-03-25 DIAGNOSIS — O09292 Supervision of pregnancy with other poor reproductive or obstetric history, second trimester: Secondary | ICD-10-CM | POA: Insufficient documentation

## 2023-03-25 DIAGNOSIS — Z3480 Encounter for supervision of other normal pregnancy, unspecified trimester: Secondary | ICD-10-CM | POA: Insufficient documentation

## 2023-03-25 DIAGNOSIS — Z3A19 19 weeks gestation of pregnancy: Secondary | ICD-10-CM | POA: Insufficient documentation

## 2023-03-25 DIAGNOSIS — O112 Pre-existing hypertension with pre-eclampsia, second trimester: Secondary | ICD-10-CM | POA: Diagnosis not present

## 2023-03-25 DIAGNOSIS — O09899 Supervision of other high risk pregnancies, unspecified trimester: Secondary | ICD-10-CM

## 2023-03-25 DIAGNOSIS — Z363 Encounter for antenatal screening for malformations: Secondary | ICD-10-CM | POA: Insufficient documentation

## 2023-04-14 ENCOUNTER — Ambulatory Visit (INDEPENDENT_AMBULATORY_CARE_PROVIDER_SITE_OTHER): Payer: 59 | Admitting: Obstetrics and Gynecology

## 2023-04-14 VITALS — BP 136/87 | HR 82 | Wt 256.0 lb

## 2023-04-14 DIAGNOSIS — Z3480 Encounter for supervision of other normal pregnancy, unspecified trimester: Secondary | ICD-10-CM

## 2023-04-14 DIAGNOSIS — O10919 Unspecified pre-existing hypertension complicating pregnancy, unspecified trimester: Secondary | ICD-10-CM

## 2023-04-14 MED ORDER — LABETALOL HCL 100 MG PO TABS: 100.0000 mg | ORAL_TABLET | Freq: Two times a day (BID) | ORAL | 1 refills | Status: DC

## 2023-04-14 NOTE — Progress Notes (Signed)
   PRENATAL VISIT NOTE  Subjective:  Carla Hall is a 27 y.o. G3P2002 at [redacted]w[redacted]d being seen today for ongoing prenatal care.  She is currently monitored for the following issues for this high-risk pregnancy and has Mild intermittent asthma; History of severe pre-eclampsia; Obesity affecting pregnancy; Chronic hypertension affecting pregnancy; Supervision of other normal pregnancy, antepartum; and Short interval between pregnancies affecting pregnancy, antepartum on their problem list.  Patient reports no complaints.  Contractions: Not present. Vag. Bleeding: None.  Movement: Present. Denies leaking of fluid.   The following portions of the patient's history were reviewed and updated as appropriate: allergies, current medications, past family history, past medical history, past social history, past surgical history and problem list.   Objective:   Vitals:   04/14/23 0841  BP: 136/87  Pulse: 82  Weight: 256 lb (116.1 kg)    Fetal Status: Fetal Heart Rate (bpm): 143   Movement: Present     General:  Alert, oriented and cooperative. Patient is in no acute distress.  Skin: Skin is warm and dry. No rash noted.   Cardiovascular: Normal heart rate noted  Respiratory: Normal respiratory effort, no problems with respiration noted  Abdomen: Soft, gravid, appropriate for gestational age.  Pain/Pressure: Absent     Pelvic: Cervical exam deferred        Extremities: Normal range of motion.  Edema: None  Mental Status: Normal mood and affect. Normal behavior. Normal judgment and thought content.   Assessment and Plan:  Pregnancy: G3P2002 at [redacted]w[redacted]d  1. Chronic hypertension affecting pregnancy  Continue labetalol 100 mg BID Continue BASA  Antenatal testing @ 32 weeks Growth [place] @ 4 weeks   2. Supervision of other normal pregnancy, antepartum  Preterm labor symptoms and general obstetric precautions including but not limited to vaginal bleeding, contractions, leaking of fluid and fetal  movement were reviewed in detail with the patient. Please refer to After Visit Summary for other counseling recommendations.   No follow-ups on file.  Future Appointments  Date Time Provider Department Center  04/29/2023  8:30 AM [facility] US4 [facility]  05/13/2023  8:10 AM Lennart Pall, MD [facility]  05/25/2023  8:50 AM Milas Hock, MD [facility]  05/28/2023  9:30 AM [facility] US3 [facility]    Venia Carbon, NP

## 2023-04-29 ENCOUNTER — Ambulatory Visit: Payer: 59 | Attending: Obstetrics and Gynecology

## 2023-04-29 ENCOUNTER — Other Ambulatory Visit: Payer: Self-pay | Admitting: *Deleted

## 2023-04-29 DIAGNOSIS — O10012 Pre-existing essential hypertension complicating pregnancy, second trimester: Secondary | ICD-10-CM

## 2023-04-29 DIAGNOSIS — Z3A24 24 weeks gestation of pregnancy: Secondary | ICD-10-CM

## 2023-04-29 DIAGNOSIS — O10912 Unspecified pre-existing hypertension complicating pregnancy, second trimester: Secondary | ICD-10-CM | POA: Insufficient documentation

## 2023-04-29 DIAGNOSIS — E669 Obesity, unspecified: Secondary | ICD-10-CM | POA: Diagnosis not present

## 2023-04-29 DIAGNOSIS — O99212 Obesity complicating pregnancy, second trimester: Secondary | ICD-10-CM

## 2023-04-29 DIAGNOSIS — O09292 Supervision of pregnancy with other poor reproductive or obstetric history, second trimester: Secondary | ICD-10-CM

## 2023-05-13 ENCOUNTER — Ambulatory Visit (INDEPENDENT_AMBULATORY_CARE_PROVIDER_SITE_OTHER): Payer: 59 | Admitting: Obstetrics and Gynecology

## 2023-05-13 VITALS — BP 131/85 | HR 84 | Wt 262.0 lb

## 2023-05-13 DIAGNOSIS — Z3480 Encounter for supervision of other normal pregnancy, unspecified trimester: Secondary | ICD-10-CM

## 2023-05-13 DIAGNOSIS — Z6836 Body mass index (BMI) 36.0-36.9, adult: Secondary | ICD-10-CM

## 2023-05-13 DIAGNOSIS — O10919 Unspecified pre-existing hypertension complicating pregnancy, unspecified trimester: Secondary | ICD-10-CM

## 2023-05-13 DIAGNOSIS — O10013 Pre-existing essential hypertension complicating pregnancy, third trimester: Secondary | ICD-10-CM

## 2023-05-13 DIAGNOSIS — O99013 Anemia complicating pregnancy, third trimester: Secondary | ICD-10-CM

## 2023-05-13 DIAGNOSIS — O99212 Obesity complicating pregnancy, second trimester: Secondary | ICD-10-CM

## 2023-05-13 DIAGNOSIS — O09293 Supervision of pregnancy with other poor reproductive or obstetric history, third trimester: Secondary | ICD-10-CM

## 2023-05-13 DIAGNOSIS — Z8759 Personal history of other complications of pregnancy, childbirth and the puerperium: Secondary | ICD-10-CM

## 2023-05-13 DIAGNOSIS — Z3A26 26 weeks gestation of pregnancy: Secondary | ICD-10-CM

## 2023-05-13 DIAGNOSIS — O10012 Pre-existing essential hypertension complicating pregnancy, second trimester: Secondary | ICD-10-CM

## 2023-05-13 DIAGNOSIS — O99513 Diseases of the respiratory system complicating pregnancy, third trimester: Secondary | ICD-10-CM

## 2023-05-13 DIAGNOSIS — E669 Obesity, unspecified: Secondary | ICD-10-CM

## 2023-05-13 DIAGNOSIS — J4520 Mild intermittent asthma, uncomplicated: Secondary | ICD-10-CM

## 2023-05-13 DIAGNOSIS — O99512 Diseases of the respiratory system complicating pregnancy, second trimester: Secondary | ICD-10-CM

## 2023-05-13 DIAGNOSIS — D649 Anemia, unspecified: Secondary | ICD-10-CM

## 2023-05-13 NOTE — Progress Notes (Signed)
   PRENATAL VISIT NOTE  Subjective:  Carla Hall is a 27 y.o. G3P2002 at [redacted]w[redacted]d being seen today for ongoing prenatal care.  She is currently monitored for the following issues for this high-risk pregnancy and has Mild intermittent asthma; History of severe pre-eclampsia; Obesity affecting pregnancy; Chronic hypertension affecting pregnancy; Supervision of other normal pregnancy, antepartum; and Short interval between pregnancies affecting pregnancy, antepartum on their problem list.  Patient reports no complaints.  Contractions: Not present. Vag. Bleeding: None.  Movement: Present. Denies leaking of fluid.   The following portions of the patient's history were reviewed and updated as appropriate: allergies, current medications, past family history, past medical history, past social history, past surgical history and problem list.   Objective:   Vitals:   05/13/23 0810  BP: 131/85  Pulse: 84  Weight: 262 lb (118.8 kg)    Fetal Status: Fetal Heart Rate (bpm): 144   Movement: Present     General:  Alert, oriented and cooperative. Patient is in no acute distress.  Skin: Skin is warm and dry. No rash noted.   Cardiovascular: Normal heart rate noted  Respiratory: Normal respiratory effort, no problems with respiration noted  Abdomen: Soft, gravid, appropriate for gestational age.  Pain/Pressure: Present      Assessment and Plan:  Pregnancy: G3P2002 at [redacted]w[redacted]d 1. Supervision of other normal pregnancy, antepartum 2. [redacted] weeks gestation of pregnancy 2h GTT today, /3\ labs Discussed tdap next appt  3. Chronic hypertension affecting pregnancy 4. History of severe pre-eclampsia Normotensive today, home BPs 120-130s/80s Continue labetalol 100 BID, ldASA (162mg ) Normal baseline labs @24w  758g (67%) - growth scheduled 12/26 Weekly testing to start at 32 weeks  5. BMI 36.0-36.9,adult  6. Mild intermittent asthma without complication No respiratory issues, got flu shot  Please  refer to After Visit Summary for other counseling recommendations.   Return in about 4 weeks (around 06/10/2023) for return OB at 30 weeks.  Future Appointments  Date Time Provider Department Center  05/25/2023  8:50 AM Milas Hock, MD [facility]  05/28/2023  9:30 AM [facility] US3 [facility]  06/09/2023  9:30 AM Rasch, Harolyn Rutherford, NP [facility]  06/23/2023  8:10 AM Rasch, Harolyn Rutherford, NP [facility]  06/24/2023  7:30 AM [facility] US1 [facility]    Lennart Pall, MD

## 2023-05-14 LAB — GLUCOSE TOLERANCE, 2 HOURS W/ 1HR
Glucose, 1 hour: 116 mg/dL (ref 70–179)
Glucose, 2 hour: 85 mg/dL (ref 70–152)
Glucose, Fasting: 78 mg/dL (ref 70–91)

## 2023-05-14 LAB — CBC
Hematocrit: 31.1 % — ABNORMAL LOW (ref 34.0–46.6)
Hemoglobin: 10.2 g/dL — ABNORMAL LOW (ref 11.1–15.9)
MCH: 29.1 pg (ref 26.6–33.0)
MCHC: 32.8 g/dL (ref 31.5–35.7)
MCV: 89 fL (ref 79–97)
Platelets: 284 10*3/uL (ref 150–450)
RBC: 3.51 x10E6/uL — ABNORMAL LOW (ref 3.77–5.28)
RDW: 13.9 % (ref 11.7–15.4)
WBC: 9.6 10*3/uL (ref 3.4–10.8)

## 2023-05-14 LAB — RPR: RPR Ser Ql: NONREACTIVE

## 2023-05-14 LAB — HIV ANTIBODY (ROUTINE TESTING W REFLEX): HIV Screen 4th Generation wRfx: NONREACTIVE

## 2023-05-15 DIAGNOSIS — D649 Anemia, unspecified: Secondary | ICD-10-CM | POA: Insufficient documentation

## 2023-05-15 MED ORDER — FERROUS SULFATE 325 (65 FE) MG PO TABS: 325.0000 mg | ORAL_TABLET | ORAL | 1 refills | Status: DC

## 2023-05-15 NOTE — Addendum Note (Signed)
Addended by: Harvie Bridge on: 05/15/2023 11:29 PM   Modules accepted: Orders

## 2023-05-16 ENCOUNTER — Encounter: Payer: Self-pay | Admitting: Obstetrics and Gynecology

## 2023-05-25 ENCOUNTER — Encounter: Payer: 59 | Admitting: Obstetrics and Gynecology

## 2023-05-28 ENCOUNTER — Other Ambulatory Visit: Payer: Self-pay

## 2023-05-28 ENCOUNTER — Other Ambulatory Visit: Payer: Self-pay | Admitting: *Deleted

## 2023-05-28 ENCOUNTER — Ambulatory Visit: Payer: 59 | Attending: Obstetrics and Gynecology

## 2023-05-28 DIAGNOSIS — O99212 Obesity complicating pregnancy, second trimester: Secondary | ICD-10-CM | POA: Diagnosis present

## 2023-05-28 DIAGNOSIS — Z3A28 28 weeks gestation of pregnancy: Secondary | ICD-10-CM

## 2023-05-28 DIAGNOSIS — O99213 Obesity complicating pregnancy, third trimester: Secondary | ICD-10-CM

## 2023-05-28 DIAGNOSIS — O10919 Unspecified pre-existing hypertension complicating pregnancy, unspecified trimester: Secondary | ICD-10-CM

## 2023-05-28 DIAGNOSIS — E669 Obesity, unspecified: Secondary | ICD-10-CM

## 2023-05-28 DIAGNOSIS — O10013 Pre-existing essential hypertension complicating pregnancy, third trimester: Secondary | ICD-10-CM | POA: Diagnosis not present

## 2023-05-28 DIAGNOSIS — O3663X0 Maternal care for excessive fetal growth, third trimester, not applicable or unspecified: Secondary | ICD-10-CM

## 2023-05-28 DIAGNOSIS — O09293 Supervision of pregnancy with other poor reproductive or obstetric history, third trimester: Secondary | ICD-10-CM

## 2023-05-28 DIAGNOSIS — O10913 Unspecified pre-existing hypertension complicating pregnancy, third trimester: Secondary | ICD-10-CM

## 2023-05-28 DIAGNOSIS — O10912 Unspecified pre-existing hypertension complicating pregnancy, second trimester: Secondary | ICD-10-CM | POA: Insufficient documentation

## 2023-05-28 MED ORDER — LABETALOL HCL 200 MG PO TABS: ORAL_TABLET | ORAL | 1 refills | Status: DC

## 2023-05-28 NOTE — Progress Notes (Signed)
Per Victorino Dike Rasch 200mg  Labetalol sent to pharmacy with instructions to take 1/2 tablet three times a day

## 2023-06-03 NOTE — L&D Delivery Note (Cosign Needed Addendum)
 OB/GYN Faculty Practice Delivery Note  Carla Hall is a 28 y.o. [phone] s/p SVD at [redacted]w[redacted]d. She was admitted for IOL for gHTN.   ROM: 9h 80m with clear fluid GBS Status: Negative/-- (02/18 1008) Maximum Maternal Temperature: 98.2 F  Labor Progress: Initial SVE: 2/60/-3. Augmented with pitocin. Epidural received at 01:59. AROM at 0510. She then progressed to complete at 14:01.   Delivery Date/Time: 07/27/23 14:22 Delivery: Called to room and patient was complete and feeling need to push. Head delivered within 4 contractions. Single nuchal cord present. Reduced after delivery. Shoulder and body delivered in usual fashion. Infant with spontaneous cry, placed on mother's abdomen, dried and stimulated. Cord clamped x 2 after 1-minute delay, and cut by FOB. Cord blood drawn. Placenta delivered spontaneously with gentle cord traction. Fundus firm with massage and Pitocin. Labia, perineum, vagina, and cervix inspected with vaginal abrasion which was hemostatic with pressure.  Baby Weight: pending  Placenta: 3 vessel, intact. Sent to L&D Complications: None Lacerations: vaginal abrasion hemostatic with pressure, repair not required EBL: 224 mL Analgesia: Epidural   Infant: baby girl [name] APGAR (1 MIN): 8  APGAR (5 MINS): 9   [name], MD Family Medicine Resident, PGY-1 07/27/2023, 4:12 PM  Attestation of Supervision of Student: I was present, gloved, and participated in the entirety of delivery and third stage.  Joanne Gavel, MD OB Fellow 07/27/2023 4:13 PM

## 2023-06-09 ENCOUNTER — Ambulatory Visit: Payer: Medicaid Other | Admitting: Obstetrics and Gynecology

## 2023-06-09 VITALS — BP 132/83 | HR 91 | Wt 262.0 lb

## 2023-06-09 DIAGNOSIS — Z3A30 30 weeks gestation of pregnancy: Secondary | ICD-10-CM

## 2023-06-09 DIAGNOSIS — Z1331 Encounter for screening for depression: Secondary | ICD-10-CM | POA: Diagnosis not present

## 2023-06-09 DIAGNOSIS — Z3480 Encounter for supervision of other normal pregnancy, unspecified trimester: Secondary | ICD-10-CM

## 2023-06-09 DIAGNOSIS — O10919 Unspecified pre-existing hypertension complicating pregnancy, unspecified trimester: Secondary | ICD-10-CM

## 2023-06-09 DIAGNOSIS — O10013 Pre-existing essential hypertension complicating pregnancy, third trimester: Secondary | ICD-10-CM

## 2023-06-09 NOTE — Progress Notes (Signed)
   PRENATAL VISIT NOTE  Subjective:  Carla Hall is a 28 y.o. G3P2002 at [redacted]w[redacted]d being seen today for ongoing prenatal care.  She is currently monitored for the following issues for this high-risk pregnancy and has Mild intermittent asthma; History of severe pre-eclampsia; Obesity affecting pregnancy; Chronic hypertension affecting pregnancy; Supervision of other normal pregnancy, antepartum; Short interval between pregnancies affecting pregnancy, antepartum; and Anemia on their problem list.  Patient reports no complaints.  Contractions: Not present. Vag. Bleeding: None.  Movement: Present. Denies leaking of fluid.   The following portions of the patient's history were reviewed and updated as appropriate: allergies, current medications, past family history, past medical history, past social history, past surgical history and problem list.   Objective:   Vitals:   06/09/23 0934  BP: 132/83  Pulse: 91  Weight: 262 lb (118.8 kg)    Fetal Status: Fetal Heart Rate (bpm): 140 Fundal Height: 32 cm Movement: Present     General:  Alert, oriented and cooperative. Patient is in no acute distress.  Skin: Skin is warm and dry. No rash noted.   Cardiovascular: Normal heart rate noted  Respiratory: Normal respiratory effort, no problems with respiration noted  Abdomen: Soft, gravid, appropriate for gestational age.  Pain/Pressure: Absent     Pelvic: Cervical exam deferred        Extremities: Normal range of motion.  Edema: None  Mental Status: Normal mood and affect. Normal behavior. Normal judgment and thought content.   Assessment and Plan:  Pregnancy: G3P2002 at [redacted]w[redacted]d 1. Supervision of other normal pregnancy, antepartum (Primary)   1. Supervision of other normal pregnancy, antepartum (Primary)  Doing well Feeling good. Normal fetal movement  2. Chronic hypertension affecting pregnancy  Continue BASA 162 mg daily  Continue Labetalol  100 mg TID Recently changed from BID  dosing. Reports normal BP log EFW 96%tile, continue to monitor growth. 2 hour GTT and A1c grossly normal Antenatal testing starting at 32 weeks. Ok to due a mix of 2x weekly office NST/AFI and MFM weekly BPP.   Preterm labor symptoms and general obstetric precautions including but not limited to vaginal bleeding, contractions, leaking of fluid and fetal movement were reviewed in detail with the patient. Please refer to After Visit Summary for other counseling recommendations.   Return Twice weekly Antenatal testing her in the office starting 1/28.  Future Appointments  Date Time Provider Department Center  06/23/2023  8:10 AM Beonca Gibb, Delon FERNS, NP [facility]  06/24/2023  7:30 AM [facility] US1 [facility]  07/23/2023  8:15 AM [facility] NURSE [facility]  07/23/2023  8:30 AM [facility] US2 [facility]    Delon Emms, NP

## 2023-06-23 ENCOUNTER — Ambulatory Visit (INDEPENDENT_AMBULATORY_CARE_PROVIDER_SITE_OTHER): Payer: Medicaid Other | Admitting: Obstetrics and Gynecology

## 2023-06-23 VITALS — BP 127/85 | HR 98 | Wt 262.0 lb

## 2023-06-23 DIAGNOSIS — Z3A35 35 weeks gestation of pregnancy: Secondary | ICD-10-CM

## 2023-06-23 DIAGNOSIS — O10013 Pre-existing essential hypertension complicating pregnancy, third trimester: Secondary | ICD-10-CM

## 2023-06-23 DIAGNOSIS — O10919 Unspecified pre-existing hypertension complicating pregnancy, unspecified trimester: Secondary | ICD-10-CM

## 2023-06-23 NOTE — Progress Notes (Signed)
   PRENATAL VISIT NOTE  Subjective:  Carla Hall is a 28 y.o. G3P2002 at [redacted]w[redacted]d being seen today for ongoing prenatal care.  She is currently monitored for the following issues for this high-risk pregnancy and has Mild intermittent asthma; History of severe pre-eclampsia; Obesity affecting pregnancy; Chronic hypertension affecting pregnancy; Supervision of other normal pregnancy, antepartum; Short interval between pregnancies affecting pregnancy, antepartum; and Anemia on their problem list.  Patient reports no complaints.  Contractions: Not present. Vag. Bleeding: None.  Movement: Present. Denies leaking of fluid.   The following portions of the patient's history were reviewed and updated as appropriate: allergies, current medications, past family history, past medical history, past social history, past surgical history and problem list.   Objective:   Vitals:   06/23/23 0811  BP: 127/85  Pulse: 98  Weight: 262 lb (118.8 kg)    Fetal Status: Fetal Heart Rate (bpm): 143 Fundal Height: 34 cm Movement: Present     General:  Alert, oriented and cooperative. Patient is in no acute distress.  Skin: Skin is warm and dry. No rash noted.   Cardiovascular: Normal heart rate noted  Respiratory: Normal respiratory effort, no problems with respiration noted  Abdomen: Soft, gravid, appropriate for gestational age.  Pain/Pressure: Absent     Pelvic: Cervical exam deferred        Extremities: Normal range of motion.  Edema: None  Mental Status: Normal mood and affect. Normal behavior. Normal judgment and thought content.   Assessment and Plan:  Pregnancy: G3P2002 at [redacted]w[redacted]d  1. Chronic hypertension affecting pregnancy (Primary)  - Growth [place] tomorrow - continue BASA 162 mg.  - Protein / creatinine ratio, urine - per patient request.  - Continue labetalol 100 mg TID   Preterm labor symptoms and general obstetric precautions including but not limited to vaginal bleeding, contractions,  leaking of fluid and fetal movement were reviewed in detail with the patient. Please refer to After Visit Summary for other counseling recommendations.   No follow-ups on file.  Future Appointments  Date Time Provider Department Center  06/24/2023  7:30 AM [facility] US1 [facility]  06/30/2023  9:30 AM [facility] NURSE [facility]  07/03/2023  9:30 AM [facility] NURSE [facility]  07/07/2023  9:30 AM [facility] NURSE [facility]  07/07/2023  9:50 AM Kalyiah Saintil, Harolyn Rutherford, NP [facility]  07/10/2023  9:30 AM [facility] NURSE [facility]  07/14/2023  9:30 AM [facility] NURSE [facility]  07/17/2023  9:30 AM [facility] NURSE [facility]  07/21/2023  9:50 AM Synda Bagent, Harolyn Rutherford, NP [facility]  07/23/2023  8:15 AM [facility] NURSE [facility]  07/23/2023  8:30 AM [facility] US2 [facility]    Venia Carbon, NP

## 2023-06-24 ENCOUNTER — Ambulatory Visit: Payer: Medicaid Other | Attending: Maternal & Fetal Medicine | Admitting: Maternal & Fetal Medicine

## 2023-06-24 ENCOUNTER — Other Ambulatory Visit: Payer: Self-pay

## 2023-06-24 ENCOUNTER — Ambulatory Visit: Payer: Medicaid Other | Attending: Maternal & Fetal Medicine

## 2023-06-24 DIAGNOSIS — O10912 Unspecified pre-existing hypertension complicating pregnancy, second trimester: Secondary | ICD-10-CM | POA: Insufficient documentation

## 2023-06-24 DIAGNOSIS — E669 Obesity, unspecified: Secondary | ICD-10-CM

## 2023-06-24 DIAGNOSIS — O3660X0 Maternal care for excessive fetal growth, unspecified trimester, not applicable or unspecified: Secondary | ICD-10-CM | POA: Diagnosis not present

## 2023-06-24 DIAGNOSIS — O3663X0 Maternal care for excessive fetal growth, third trimester, not applicable or unspecified: Secondary | ICD-10-CM | POA: Diagnosis not present

## 2023-06-24 DIAGNOSIS — Z3A32 32 weeks gestation of pregnancy: Secondary | ICD-10-CM

## 2023-06-24 DIAGNOSIS — O09293 Supervision of pregnancy with other poor reproductive or obstetric history, third trimester: Secondary | ICD-10-CM

## 2023-06-24 DIAGNOSIS — O99213 Obesity complicating pregnancy, third trimester: Secondary | ICD-10-CM

## 2023-06-24 DIAGNOSIS — O10013 Pre-existing essential hypertension complicating pregnancy, third trimester: Secondary | ICD-10-CM

## 2023-06-24 DIAGNOSIS — O10919 Unspecified pre-existing hypertension complicating pregnancy, unspecified trimester: Secondary | ICD-10-CM

## 2023-06-24 NOTE — Progress Notes (Signed)
Patient information  Patient Name: Carla Hall  Patient MRN:   [id number]  Referring practice: [facility] Referring Provider: [facility] [facility]  MFM CONSULT  Carla Hall is a 28 y.o. G3P2002 at [redacted]w[redacted]d here for ultrasound and consultation. Patient Active Problem List   Diagnosis Date Noted   Large for gestational age fetus affecting management of mother, antepartum 06/24/2023   Anemia 05/15/2023   Short interval between pregnancies affecting pregnancy, antepartum 03/13/2023   Supervision of other normal pregnancy, antepartum 01/13/2023   Chronic hypertension affecting pregnancy 04/01/2022   History of severe pre-eclampsia 10/01/2021   Obesity affecting pregnancy 10/01/2021   Mild intermittent asthma 04/08/2018    Carla Hall is doing well today with no acute concerns. She denies contractions, bleeding, or loss of fluid and reports good fetal movement.   RE LGA: I discussed that the fetus is at the estimated fetal weight of the 9 9th percentile both at the overall weight and at the abdominal circumference.  Carla Hall is about 5 foot 11 and her husband is 6 foot 3.  Her previous children were under 8 pounds.  She passed her Glucola and needs a low-carb diet.  We discussed that the etiology is unknown at this time but this fetus may just have a larger growth potential than the other children.  I discussed that delivery timing cannot be done before 39 weeks for this indication alone but since she has chronic upper tension delivery around 37 to 38 weeks would be preferable.  RE chronic hypertension: Blood pressure is well-controlled today at 137/74.  She is compliant with her blood pressure medication.  She denies headaches, vision changes right upper quadrant pain.  We discussed delivery timing will be around 37 to [redacted] weeks along with weekly antenatal testing prior to this time starting around 32 weeks.  Sonographic findings Single intrauterine pregnancy. Fetal  cardiac activity: Observed. Presentation: Cephalic. Interval fetal anatomy appears normal. Fetal biometry shows the estimated fetal weight at the 99 percentile and the abdominal circumference at the 99 percentile.  Amniotic fluid: Within normal limits.  MVP: 5.62 cm. Placenta: Anterior. BPP: 8/8.   There are limitations of prenatal ultrasound such as the inability to detect certain abnormalities due to poor visualization. Various factors such as fetal position, gestational age and maternal body habitus may increase the difficulty in visualizing the fetal anatomy.    Recommendations -Continue weekly antenatal testing and serial growth ultrasounds as scheduled.  Delivery timing likely around 37 to 38 weeks pending her clinical course. -Blood pressure goal of less than 140/90.  Currently at goal.  Continue medication as prescribed.  Review of Systems: A review of systems was performed and was negative except per HPI   Vitals and Physical Exam    06/24/2023    7:32 AM 06/23/2023    8:11 AM 06/09/2023    9:34 AM  Vitals with BMI  Weight  262 lbs 262 lbs  Systolic 137 127 518  Diastolic 74 85 83  Pulse  98 91    Sitting comfortably on the sonogram table Nonlabored breathing Normal rate and rhythm Abdomen is nontender  Past pregnancies OB History  Gravida Para Term Preterm AB Living  3 2 2  0 0 2  SAB IAB Ectopic Multiple Live Births  0 0 0 0 2    # Outcome Date GA Lbr Len/2nd Weight Sex Type Anes PTL Lv  3 Current           2  Term 04/08/22 [redacted]w[redacted]d 05:28 / 00:04 6 lb 5.6 oz (2.88 kg) F Vag-Spont EPI  LIV     Birth Comments: [id number]  1 Term 03/02/20 [redacted]w[redacted]d / 02:01 7 lb 10.6 oz (3.476 kg) M Vag-Spont EPI  LIV    I spent 20 minutes reviewing the patients chart, including labs and images as well as counseling the patient about her medical conditions. Greater than 50% of the time was spent in direct face-to-face patient counseling.  Braxton Feathers  MFM, [facility]   06/24/2023  4:05 PM

## 2023-06-25 LAB — PROTEIN / CREATININE RATIO, URINE
Creatinine, Urine: 138.9 mg/dL
Protein, Ur: 29 mg/dL
Protein/Creat Ratio: 209 mg/g{creat} — ABNORMAL HIGH (ref 0–200)

## 2023-06-30 ENCOUNTER — Ambulatory Visit: Payer: Medicaid Other | Admitting: *Deleted

## 2023-06-30 VITALS — BP 142/76 | HR 80

## 2023-06-30 DIAGNOSIS — Z3480 Encounter for supervision of other normal pregnancy, unspecified trimester: Secondary | ICD-10-CM

## 2023-06-30 DIAGNOSIS — O10919 Unspecified pre-existing hypertension complicating pregnancy, unspecified trimester: Secondary | ICD-10-CM

## 2023-06-30 DIAGNOSIS — Z3A33 33 weeks gestation of pregnancy: Secondary | ICD-10-CM

## 2023-06-30 DIAGNOSIS — O10013 Pre-existing essential hypertension complicating pregnancy, third trimester: Secondary | ICD-10-CM | POA: Diagnosis not present

## 2023-06-30 DIAGNOSIS — Z23 Encounter for immunization: Secondary | ICD-10-CM

## 2023-06-30 NOTE — Progress Notes (Addendum)
Tdap given Lt Deltoid IM. NST reactive

## 2023-07-01 ENCOUNTER — Ambulatory Visit: Payer: 59

## 2023-07-01 ENCOUNTER — Other Ambulatory Visit: Payer: 59

## 2023-07-03 ENCOUNTER — Ambulatory Visit: Payer: Medicaid Other | Admitting: *Deleted

## 2023-07-03 ENCOUNTER — Other Ambulatory Visit: Payer: Medicaid Other

## 2023-07-03 VITALS — BP 130/78 | HR 93 | Wt 261.0 lb

## 2023-07-03 DIAGNOSIS — Z3A33 33 weeks gestation of pregnancy: Secondary | ICD-10-CM

## 2023-07-03 DIAGNOSIS — O10913 Unspecified pre-existing hypertension complicating pregnancy, third trimester: Secondary | ICD-10-CM

## 2023-07-03 DIAGNOSIS — O10919 Unspecified pre-existing hypertension complicating pregnancy, unspecified trimester: Secondary | ICD-10-CM

## 2023-07-03 NOTE — Progress Notes (Addendum)
NST-Reactive AFI-16.98 NML  Reviewed NST,   Agree with RN's interpretation and note  Rasch, Harolyn Rutherford, NP 07/03/2023 11:37 AM

## 2023-07-07 ENCOUNTER — Ambulatory Visit (INDEPENDENT_AMBULATORY_CARE_PROVIDER_SITE_OTHER): Payer: Medicaid Other | Admitting: Obstetrics and Gynecology

## 2023-07-07 ENCOUNTER — Ambulatory Visit: Payer: Medicaid Other | Admitting: *Deleted

## 2023-07-07 ENCOUNTER — Ambulatory Visit: Payer: Medicaid Other

## 2023-07-07 VITALS — BP 140/74 | HR 92

## 2023-07-07 VITALS — BP 140/79 | Wt 264.0 lb

## 2023-07-07 DIAGNOSIS — O10913 Unspecified pre-existing hypertension complicating pregnancy, third trimester: Secondary | ICD-10-CM | POA: Diagnosis not present

## 2023-07-07 DIAGNOSIS — Z3A34 34 weeks gestation of pregnancy: Secondary | ICD-10-CM

## 2023-07-07 DIAGNOSIS — Z3480 Encounter for supervision of other normal pregnancy, unspecified trimester: Secondary | ICD-10-CM

## 2023-07-07 DIAGNOSIS — O10919 Unspecified pre-existing hypertension complicating pregnancy, unspecified trimester: Secondary | ICD-10-CM

## 2023-07-07 MED ORDER — FAMOTIDINE 20 MG PO TABS: 20.0000 mg | ORAL_TABLET | Freq: Two times a day (BID) | ORAL | 1 refills | Status: DC

## 2023-07-07 MED ORDER — LABETALOL HCL 200 MG PO TABS: 200.0000 mg | ORAL_TABLET | Freq: Three times a day (TID) | ORAL | 1 refills | Status: AC

## 2023-07-07 NOTE — Progress Notes (Addendum)
   PRENATAL VISIT NOTE  Subjective:  Carla Hall is a 28 y.o. G3P2002 at [redacted]w[redacted]d being seen today for ongoing prenatal care.  She is currently monitored for the following issues for this high-risk pregnancy and has Mild intermittent asthma; History of severe pre-eclampsia; Obesity affecting pregnancy; Chronic hypertension affecting pregnancy; Supervision of other normal pregnancy, antepartum; Short interval between pregnancies affecting pregnancy, antepartum; Anemia; and Large for gestational age fetus affecting management of mother, antepartum on their problem list.  Patient reports no complaints.  Contractions: Not present. Vag. Bleeding: None.  Movement: Present. Denies leaking of fluid.   The following portions of the patient's history were reviewed and updated as appropriate: allergies, current medications, past family history, past medical history, past social history, past surgical history and problem list.   Objective:   Vitals:   07/07/23 0925 07/07/23 0958  BP: (!) 140/74 (!) 140/79  Weight: 264 lb (119.7 kg)     Fetal Status:   Fundal Height: 34 cm Movement: Present     General:  Alert, oriented and cooperative. Patient is in no acute distress.  Skin: Skin is warm and dry. No rash noted.   Cardiovascular: Normal heart rate noted  Respiratory: Normal respiratory effort, no problems with respiration noted  Abdomen: Soft, gravid, appropriate for gestational age.  Pain/Pressure: Absent     Pelvic: Cervical exam deferred        Extremities: Normal range of motion.  Edema: None  Mental Status: Normal mood and affect. Normal behavior. Normal judgment and thought content.   Assessment and Plan:  Pregnancy: G3P2002 at [redacted]w[redacted]d  1. Chronic hypertension affecting pregnancy (Primary)  - CBC w/Diff - Comp Met (CMET) - Protein / creatinine ratio, urine  - BP elevated - no symptoms  - Planning induction at 37 weeks due to history of severe pre E, CHTN actively adjusting BP  medications, LGA, MFM agrees with delivery dating.  - Increase labetalol  to 200 mg TID  - Continue antenatal testing - Continue BASA intermediate dose ASA 162 mg   2. Supervision of other normal pregnancy, antepartum  NST reactive baseline 130 bpm, 15x15 acels, no decels.    Preterm labor symptoms and general obstetric precautions including but not limited to vaginal bleeding, contractions, leaking of fluid and fetal movement were reviewed in detail with the patient. Please refer to After Visit Summary for other counseling recommendations.   No follow-ups on file.  Future Appointments  Date Time Provider Department Center  07/10/2023  9:30 AM [facility] NURSE [facility]  07/14/2023  9:30 AM [facility]UR[facility]  07/17/2023  9:30 AM [facility]URSE [facility]  07/21/2023  9:50 AM Jesicca Dipierro, Delon FERNS, NP [facility]  07/23/2023  8:15 AM [facility] NURSE [facility]  07/23/2023  8:30 AM [facility] US2 [facility]  07/27/2023  1:50 PM Cleatus Moccasin, MD [facility]    Delon Emms, NP

## 2023-07-07 NOTE — Progress Notes (Signed)
 NST-Reactive AFI-16.03

## 2023-07-08 LAB — CBC WITH DIFFERENTIAL/PLATELET
Basophils Absolute: 0 10*3/uL (ref 0.0–0.2)
Basos: 0 %
EOS (ABSOLUTE): 0.1 10*3/uL (ref 0.0–0.4)
Eos: 1 %
Hematocrit: 30.3 % — ABNORMAL LOW (ref 34.0–46.6)
Hemoglobin: 10.4 g/dL — ABNORMAL LOW (ref 11.1–15.9)
Immature Grans (Abs): 0.1 10*3/uL (ref 0.0–0.1)
Immature Granulocytes: 1 %
Lymphocytes Absolute: 1.6 10*3/uL (ref 0.7–3.1)
Lymphs: 15 %
MCH: 30.1 pg (ref 26.6–33.0)
MCHC: 34.3 g/dL (ref 31.5–35.7)
MCV: 88 fL (ref 79–97)
Monocytes Absolute: 0.5 10*3/uL (ref 0.1–0.9)
Monocytes: 4 %
Neutrophils Absolute: 8.7 10*3/uL — ABNORMAL HIGH (ref 1.4–7.0)
Neutrophils: 79 %
Platelets: 266 10*3/uL (ref 150–450)
RBC: 3.46 x10E6/uL — ABNORMAL LOW (ref 3.77–5.28)
RDW: 13.1 % (ref 11.7–15.4)
WBC: 11 10*3/uL — ABNORMAL HIGH (ref 3.4–10.8)

## 2023-07-08 LAB — COMPREHENSIVE METABOLIC PANEL
ALT: 12 [IU]/L (ref 0–32)
AST: 12 [IU]/L (ref 0–40)
Albumin: 3.7 g/dL — ABNORMAL LOW (ref 4.0–5.0)
Alkaline Phosphatase: 76 [IU]/L (ref 44–121)
BUN/Creatinine Ratio: 10 (ref 9–23)
BUN: 7 mg/dL (ref 6–20)
Bilirubin Total: 0.2 mg/dL (ref 0.0–1.2)
CO2: 15 mmol/L — ABNORMAL LOW (ref 20–29)
Calcium: 9.2 mg/dL (ref 8.7–10.2)
Chloride: 107 mmol/L — ABNORMAL HIGH (ref 96–106)
Creatinine, Ser: 0.69 mg/dL (ref 0.57–1.00)
Globulin, Total: 2.3 g/dL (ref 1.5–4.5)
Glucose: 91 mg/dL (ref 70–99)
Potassium: 4.1 mmol/L (ref 3.5–5.2)
Sodium: 138 mmol/L (ref 134–144)
Total Protein: 6 g/dL (ref 6.0–8.5)
eGFR: 120 mL/min/{1.73_m2} (ref >59)

## 2023-07-08 LAB — PROTEIN / CREATININE RATIO, URINE
Creatinine, Urine: 70.9 mg/dL
Protein, Ur: 13.3 mg/dL
Protein/Creat Ratio: 188 mg/g{creat} (ref 0–200)

## 2023-07-10 ENCOUNTER — Ambulatory Visit (INDEPENDENT_AMBULATORY_CARE_PROVIDER_SITE_OTHER): Payer: Medicaid Other | Admitting: *Deleted

## 2023-07-10 VITALS — BP 135/77 | HR 85

## 2023-07-10 DIAGNOSIS — O10913 Unspecified pre-existing hypertension complicating pregnancy, third trimester: Secondary | ICD-10-CM | POA: Diagnosis not present

## 2023-07-10 DIAGNOSIS — Z3A34 34 weeks gestation of pregnancy: Secondary | ICD-10-CM | POA: Diagnosis not present

## 2023-07-10 DIAGNOSIS — O10919 Unspecified pre-existing hypertension complicating pregnancy, unspecified trimester: Secondary | ICD-10-CM

## 2023-07-10 NOTE — Progress Notes (Signed)
 NST Reactive

## 2023-07-14 ENCOUNTER — Ambulatory Visit: Payer: Medicaid Other | Admitting: *Deleted

## 2023-07-14 ENCOUNTER — Other Ambulatory Visit (INDEPENDENT_AMBULATORY_CARE_PROVIDER_SITE_OTHER): Payer: Self-pay

## 2023-07-14 VITALS — BP 133/72 | HR 78

## 2023-07-14 DIAGNOSIS — Z3A35 35 weeks gestation of pregnancy: Secondary | ICD-10-CM | POA: Diagnosis not present

## 2023-07-14 DIAGNOSIS — O10913 Unspecified pre-existing hypertension complicating pregnancy, third trimester: Secondary | ICD-10-CM | POA: Diagnosis not present

## 2023-07-14 DIAGNOSIS — O10919 Unspecified pre-existing hypertension complicating pregnancy, unspecified trimester: Secondary | ICD-10-CM

## 2023-07-14 NOTE — Progress Notes (Signed)
NST-NML AFI-16.55

## 2023-07-17 ENCOUNTER — Other Ambulatory Visit: Payer: Self-pay | Admitting: Obstetrics and Gynecology

## 2023-07-17 ENCOUNTER — Ambulatory Visit (INDEPENDENT_AMBULATORY_CARE_PROVIDER_SITE_OTHER): Payer: Medicaid Other | Admitting: *Deleted

## 2023-07-17 VITALS — BP 136/81 | HR 88 | Wt 262.0 lb

## 2023-07-17 DIAGNOSIS — O10919 Unspecified pre-existing hypertension complicating pregnancy, unspecified trimester: Secondary | ICD-10-CM

## 2023-07-17 DIAGNOSIS — Z3A35 35 weeks gestation of pregnancy: Secondary | ICD-10-CM | POA: Diagnosis not present

## 2023-07-17 NOTE — Progress Notes (Signed)
NST Reactive

## 2023-07-21 ENCOUNTER — Other Ambulatory Visit (HOSPITAL_COMMUNITY)
Admission: RE | Admit: 2023-07-21 | Discharge: 2023-07-21 | Disposition: A | Discharge status: Home / Self Care | Payer: Medicaid Other | Source: Ambulatory Visit | Attending: Obstetrics and Gynecology | Admitting: Obstetrics and Gynecology

## 2023-07-21 ENCOUNTER — Ambulatory Visit (INDEPENDENT_AMBULATORY_CARE_PROVIDER_SITE_OTHER): Payer: Medicaid Other | Admitting: Obstetrics and Gynecology

## 2023-07-21 VITALS — BP 135/87 | HR 99 | Wt 262.0 lb

## 2023-07-21 DIAGNOSIS — Z3483 Encounter for supervision of other normal pregnancy, third trimester: Secondary | ICD-10-CM

## 2023-07-21 DIAGNOSIS — Z3A36 36 weeks gestation of pregnancy: Secondary | ICD-10-CM | POA: Diagnosis not present

## 2023-07-21 DIAGNOSIS — Z3480 Encounter for supervision of other normal pregnancy, unspecified trimester: Secondary | ICD-10-CM

## 2023-07-21 LAB — CERVICOVAGINAL ANCILLARY ONLY
Chlamydia: NEGATIVE
Comment: NEGATIVE
Comment: NORMAL
Neisseria Gonorrhea: NEGATIVE

## 2023-07-21 NOTE — Progress Notes (Signed)
   PRENATAL VISIT NOTE  Subjective:  Carla Hall is a 28 y.o. G3P2002 at [redacted]w[redacted]d being seen today for ongoing prenatal care.  She is currently monitored for the following issues for this high-risk pregnancy and has Mild intermittent asthma; History of severe pre-eclampsia; Obesity affecting pregnancy; Chronic hypertension affecting pregnancy; Supervision of other normal pregnancy, antepartum; Short interval between pregnancies affecting pregnancy, antepartum; Anemia; and Large for gestational age fetus affecting management of mother, antepartum on their problem list.  Patient reports no complaints.   .  .   . Denies leaking of fluid.   The following portions of the patient's history were reviewed and updated as appropriate: allergies, current medications, past family history, past medical history, past social history, past surgical history and problem list.   Objective:   Vitals:   07/21/23 0945  BP: 135/87  Pulse: 99  Weight: 262 lb (118.8 kg)    Fetal Status:   Fundal Height: 37 cm       General:  Alert, oriented and cooperative. Patient is in no acute distress.  Skin: Skin is warm and dry. No rash noted.   Cardiovascular: Normal heart rate noted  Respiratory: Normal respiratory effort, no problems with respiration noted  Abdomen: Soft, gravid, appropriate for gestational age.        Pelvic: Cervical exam performed in the presence of a chaperone Dilation: 1 Effacement (%): 50 Station: -3  Extremities: Normal range of motion.     Mental Status: Normal mood and affect. Normal behavior. Normal judgment and thought content.   Assessment and Plan:  Pregnancy: G3P2002 at [redacted]w[redacted]d 1. Supervision of other normal pregnancy, antepartum (Primary)   - Culture, beta strep (group b only) - Cervicovaginal ancillary only( [facility])  - Induction scheduled at 37w for LGA and gHTN on meds.  - Induction orders placed.  - Continue labetalol 100 mg TID - Continue BASA    Term labor  symptoms and general obstetric precautions including but not limited to vaginal bleeding, contractions, leaking of fluid and fetal movement were reviewed in detail with the patient. Please refer to After Visit Summary for other counseling recommendations.   No follow-ups on file.  Future Appointments  Date Time Provider Department Center  07/23/2023  8:15 AM [facility] NURSE [facility]  07/23/2023  8:30 AM [facility] US2 [facility]  07/26/2023  7:00 AM [facility] SCHED ROOM [facility] None  07/27/2023  1:50 PM Penne Lash, Fredrich Romans, MD [facility]    Venia Carbon, NP

## 2023-07-22 ENCOUNTER — Telehealth (HOSPITAL_COMMUNITY): Payer: Self-pay | Admitting: *Deleted

## 2023-07-22 NOTE — Telephone Encounter (Signed)
 Preadmission screen

## 2023-07-23 ENCOUNTER — Ambulatory Visit: Payer: Self-pay

## 2023-07-23 ENCOUNTER — Ambulatory Visit: Payer: 59

## 2023-07-23 ENCOUNTER — Telehealth (HOSPITAL_COMMUNITY): Payer: Self-pay | Admitting: *Deleted

## 2023-07-23 ENCOUNTER — Encounter (HOSPITAL_COMMUNITY): Payer: Self-pay | Admitting: *Deleted

## 2023-07-23 NOTE — Telephone Encounter (Signed)
 Preadmission screen

## 2023-07-24 ENCOUNTER — Other Ambulatory Visit: Payer: Self-pay

## 2023-07-24 ENCOUNTER — Ambulatory Visit: Payer: Medicaid Other | Attending: Maternal & Fetal Medicine

## 2023-07-24 ENCOUNTER — Ambulatory Visit: Payer: Medicaid Other

## 2023-07-24 VITALS — BP 131/78 | HR 96

## 2023-07-24 DIAGNOSIS — O3663X0 Maternal care for excessive fetal growth, third trimester, not applicable or unspecified: Secondary | ICD-10-CM | POA: Diagnosis not present

## 2023-07-24 DIAGNOSIS — O10913 Unspecified pre-existing hypertension complicating pregnancy, third trimester: Secondary | ICD-10-CM | POA: Diagnosis present

## 2023-07-24 DIAGNOSIS — O10013 Pre-existing essential hypertension complicating pregnancy, third trimester: Secondary | ICD-10-CM

## 2023-07-24 DIAGNOSIS — O99213 Obesity complicating pregnancy, third trimester: Secondary | ICD-10-CM

## 2023-07-24 DIAGNOSIS — E669 Obesity, unspecified: Secondary | ICD-10-CM

## 2023-07-24 DIAGNOSIS — O09293 Supervision of pregnancy with other poor reproductive or obstetric history, third trimester: Secondary | ICD-10-CM

## 2023-07-24 DIAGNOSIS — Z3A36 36 weeks gestation of pregnancy: Secondary | ICD-10-CM

## 2023-07-26 ENCOUNTER — Inpatient Hospital Stay (HOSPITAL_COMMUNITY)
Admission: RE | Admit: 2023-07-26 | Discharge: 2023-07-28 | DRG: 807 | Disposition: A | Discharge status: Home / Self Care | Payer: Medicaid Other | Attending: Obstetrics and Gynecology | Admitting: Obstetrics and Gynecology

## 2023-07-26 ENCOUNTER — Encounter (HOSPITAL_COMMUNITY): Payer: Self-pay | Admitting: Family Medicine

## 2023-07-26 ENCOUNTER — Other Ambulatory Visit: Payer: Self-pay

## 2023-07-26 ENCOUNTER — Inpatient Hospital Stay (HOSPITAL_COMMUNITY): Payer: Medicaid Other

## 2023-07-26 DIAGNOSIS — Z8616 Personal history of COVID-19: Secondary | ICD-10-CM | POA: Diagnosis not present

## 2023-07-26 DIAGNOSIS — Z880 Allergy status to penicillin: Secondary | ICD-10-CM

## 2023-07-26 DIAGNOSIS — Z833 Family history of diabetes mellitus: Secondary | ICD-10-CM

## 2023-07-26 DIAGNOSIS — Z8249 Family history of ischemic heart disease and other diseases of the circulatory system: Secondary | ICD-10-CM | POA: Diagnosis not present

## 2023-07-26 DIAGNOSIS — Z7982 Long term (current) use of aspirin: Secondary | ICD-10-CM

## 2023-07-26 DIAGNOSIS — O134 Gestational [pregnancy-induced] hypertension without significant proteinuria, complicating childbirth: Principal | ICD-10-CM | POA: Diagnosis present

## 2023-07-26 DIAGNOSIS — O3663X0 Maternal care for excessive fetal growth, third trimester, not applicable or unspecified: Secondary | ICD-10-CM | POA: Diagnosis present

## 2023-07-26 DIAGNOSIS — O139 Gestational [pregnancy-induced] hypertension without significant proteinuria, unspecified trimester: Principal | ICD-10-CM | POA: Diagnosis present

## 2023-07-26 DIAGNOSIS — O10919 Unspecified pre-existing hypertension complicating pregnancy, unspecified trimester: Principal | ICD-10-CM | POA: Diagnosis present

## 2023-07-26 DIAGNOSIS — Z3A37 37 weeks gestation of pregnancy: Secondary | ICD-10-CM | POA: Diagnosis not present

## 2023-07-26 DIAGNOSIS — Z3480 Encounter for supervision of other normal pregnancy, unspecified trimester: Secondary | ICD-10-CM

## 2023-07-26 DIAGNOSIS — O3660X0 Maternal care for excessive fetal growth, unspecified trimester, not applicable or unspecified: Secondary | ICD-10-CM | POA: Diagnosis present

## 2023-07-26 LAB — CULTURE, BETA STREP (GROUP B ONLY): Strep Gp B Culture: NEGATIVE

## 2023-07-26 MED ORDER — ONDANSETRON HCL 4 MG/2ML IJ SOLN
4.0000 mg | Freq: Four times a day (QID) | INTRAMUSCULAR | Status: DC | PRN
  Administered 2023-07-27: 4 mg via INTRAVENOUS
  Filled 2023-07-26: qty 2

## 2023-07-26 MED ORDER — OXYTOCIN BOLUS FROM INFUSION
333.0000 mL | Freq: Once | INTRAVENOUS | Status: AC
  Administered 2023-07-27: 333 mL via INTRAVENOUS

## 2023-07-26 MED ORDER — LACTATED RINGERS IV SOLN
500.0000 mL | INTRAVENOUS | Status: DC | PRN
  Administered 2023-07-27: 1000 mL via INTRAVENOUS

## 2023-07-26 MED ORDER — OXYCODONE-ACETAMINOPHEN 5-325 MG PO TABS: 1.0000 | ORAL_TABLET | ORAL | Status: DC | PRN

## 2023-07-26 MED ORDER — SOD CITRATE-CITRIC ACID 500-334 MG/5ML PO SOLN: 30.0000 mL | ORAL | Status: DC | PRN

## 2023-07-26 MED ORDER — OXYCODONE-ACETAMINOPHEN 5-325 MG PO TABS: 2.0000 | ORAL_TABLET | ORAL | Status: DC | PRN

## 2023-07-26 MED ORDER — LACTATED RINGERS IV SOLN: INTRAVENOUS | Status: DC

## 2023-07-26 MED ORDER — OXYTOCIN-SODIUM CHLORIDE 30-0.9 UT/500ML-% IV SOLN
2.5000 [IU]/h | INTRAVENOUS | Status: DC
  Administered 2023-07-27: 2.5 [IU]/h via INTRAVENOUS

## 2023-07-26 MED ORDER — LIDOCAINE HCL (PF) 1 % IJ SOLN
30.0000 mL | INTRAMUSCULAR | Status: DC | PRN
Start: 2023-07-26 — End: 2023-07-27

## 2023-07-26 MED ORDER — ACETAMINOPHEN 325 MG PO TABS
650.0000 mg | ORAL_TABLET | ORAL | Status: DC | PRN
  Administered 2023-07-27: 650 mg via ORAL
  Filled 2023-07-26: qty 2

## 2023-07-27 ENCOUNTER — Inpatient Hospital Stay (HOSPITAL_COMMUNITY): Payer: Medicaid Other | Admitting: Anesthesiology

## 2023-07-27 ENCOUNTER — Encounter: Payer: Medicaid Other | Admitting: Obstetrics & Gynecology

## 2023-07-27 ENCOUNTER — Encounter (HOSPITAL_COMMUNITY): Payer: Self-pay | Admitting: Family Medicine

## 2023-07-27 DIAGNOSIS — O134 Gestational [pregnancy-induced] hypertension without significant proteinuria, complicating childbirth: Secondary | ICD-10-CM | POA: Diagnosis not present

## 2023-07-27 DIAGNOSIS — Z3A37 37 weeks gestation of pregnancy: Secondary | ICD-10-CM | POA: Diagnosis not present

## 2023-07-27 DIAGNOSIS — O3663X0 Maternal care for excessive fetal growth, third trimester, not applicable or unspecified: Secondary | ICD-10-CM | POA: Diagnosis not present

## 2023-07-27 LAB — CBC
HCT: 28.3 % — ABNORMAL LOW (ref 36.0–46.0)
Hemoglobin: 9.7 g/dL — ABNORMAL LOW (ref 12.0–15.0)
MCH: 29.9 pg (ref 26.0–34.0)
MCHC: 34.3 g/dL (ref 30.0–36.0)
MCV: 87.3 fL (ref 80.0–100.0)
Platelets: 236 10*3/uL (ref 150–400)
RBC: 3.24 MIL/uL — ABNORMAL LOW (ref 3.87–5.11)
RDW: 14.1 % (ref 11.5–15.5)
WBC: 10.1 10*3/uL (ref 4.0–10.5)
nRBC: 0 % (ref 0.0–0.2)

## 2023-07-27 LAB — COMPREHENSIVE METABOLIC PANEL
ALT: 14 U/L (ref 0–44)
AST: 21 U/L (ref 15–41)
Albumin: 2.7 g/dL — ABNORMAL LOW (ref 3.5–5.0)
Alkaline Phosphatase: 60 U/L (ref 38–126)
Anion gap: 12 (ref 5–15)
BUN: 8 mg/dL (ref 6–20)
CO2: 15 mmol/L — ABNORMAL LOW (ref 22–32)
Calcium: 9.2 mg/dL (ref 8.9–10.3)
Chloride: 109 mmol/L (ref 98–111)
Creatinine, Ser: 0.62 mg/dL (ref 0.44–1.00)
GFR, Estimated: >60 mL/min (ref >60)
Glucose, Bld: 82 mg/dL (ref 70–99)
Potassium: 3.6 mmol/L (ref 3.5–5.1)
Sodium: 136 mmol/L (ref 135–145)
Total Bilirubin: 0.4 mg/dL (ref 0.0–1.2)
Total Protein: 5.7 g/dL — ABNORMAL LOW (ref 6.5–8.1)

## 2023-07-27 LAB — TYPE AND SCREEN

## 2023-07-27 LAB — RPR: RPR Ser Ql: NONREACTIVE

## 2023-07-27 LAB — PROTEIN / CREATININE RATIO, URINE
Creatinine, Urine: 72 mg/dL
Protein Creatinine Ratio: 0.19 mg/mg{creat} — ABNORMAL HIGH (ref 0.00–0.15)
Total Protein, Urine: 14 mg/dL

## 2023-07-27 MED ORDER — DIPHENHYDRAMINE HCL 50 MG/ML IJ SOLN: 12.5000 mg | INTRAMUSCULAR | Status: DC | PRN

## 2023-07-27 MED ORDER — PRENATAL MULTIVITAMIN CH
1.0000 | ORAL_TABLET | Freq: Every day | ORAL | Status: DC
  Filled 2023-07-27: qty 1

## 2023-07-27 MED ORDER — ZOLPIDEM TARTRATE 5 MG PO TABS: 5.0000 mg | ORAL_TABLET | Freq: Every evening | ORAL | Status: DC | PRN

## 2023-07-27 MED ORDER — DIBUCAINE (PERIANAL) 1 % EX OINT: 1.0000 | TOPICAL_OINTMENT | CUTANEOUS | Status: DC | PRN

## 2023-07-27 MED ORDER — SODIUM CHLORIDE 0.9% FLUSH: 3.0000 mL | INTRAVENOUS | Status: DC | PRN

## 2023-07-27 MED ORDER — PHENYLEPHRINE 80 MCG/ML (10ML) SYRINGE FOR IV PUSH (FOR BLOOD PRESSURE SUPPORT): 80.0000 ug | PREFILLED_SYRINGE | INTRAVENOUS | Status: DC | PRN

## 2023-07-27 MED ORDER — SODIUM CHLORIDE 0.9 % IV SOLN: 250.0000 mL | INTRAVENOUS | Status: DC | PRN

## 2023-07-27 MED ORDER — LIDOCAINE HCL (PF) 1 % IJ SOLN
INTRAMUSCULAR | Status: DC | PRN
  Administered 2023-07-27: 11 mL via EPIDURAL

## 2023-07-27 MED ORDER — ONDANSETRON HCL 4 MG PO TABS: 4.0000 mg | ORAL_TABLET | ORAL | Status: DC | PRN

## 2023-07-27 MED ORDER — TERBUTALINE SULFATE 1 MG/ML IJ SOLN: 0.2500 mg | Freq: Once | INTRAMUSCULAR | Status: DC | PRN

## 2023-07-27 MED ORDER — MEASLES, MUMPS & RUBELLA VAC IJ SOLR: 0.5000 mL | Freq: Once | INTRAMUSCULAR | Status: DC

## 2023-07-27 MED ORDER — COCONUT OIL OIL: 1.0000 | TOPICAL_OIL | Status: DC | PRN

## 2023-07-27 MED ORDER — WITCH HAZEL-GLYCERIN EX PADS: 1.0000 | MEDICATED_PAD | CUTANEOUS | Status: DC | PRN

## 2023-07-27 MED ORDER — SIMETHICONE 80 MG PO CHEW: 80.0000 mg | CHEWABLE_TABLET | ORAL | Status: DC | PRN

## 2023-07-27 MED ORDER — SENNOSIDES-DOCUSATE SODIUM 8.6-50 MG PO TABS
2.0000 | ORAL_TABLET | ORAL | Status: DC
  Administered 2023-07-28: 2 via ORAL
  Filled 2023-07-27: qty 2

## 2023-07-27 MED ORDER — LACTATED RINGERS IV SOLN: 500.0000 mL | Freq: Once | INTRAVENOUS | Status: DC

## 2023-07-27 MED ORDER — LABETALOL HCL 200 MG PO TABS
200.0000 mg | ORAL_TABLET | Freq: Three times a day (TID) | ORAL | Status: DC
  Administered 2023-07-27 – 2023-07-28 (×5): 200 mg via ORAL
  Filled 2023-07-27 (×5): qty 1

## 2023-07-27 MED ORDER — DIPHENHYDRAMINE HCL 25 MG PO CAPS: 25.0000 mg | ORAL_CAPSULE | Freq: Four times a day (QID) | ORAL | Status: DC | PRN

## 2023-07-27 MED ORDER — EPHEDRINE 5 MG/ML INJ: 10.0000 mg | INTRAVENOUS | Status: DC | PRN

## 2023-07-27 MED ORDER — OXYCODONE HCL 5 MG PO TABS: 5.0000 mg | ORAL_TABLET | ORAL | Status: DC | PRN

## 2023-07-27 MED ORDER — ONDANSETRON HCL 4 MG/2ML IJ SOLN: 4.0000 mg | INTRAMUSCULAR | Status: DC | PRN

## 2023-07-27 MED ORDER — TETANUS-DIPHTH-ACELL PERTUSSIS 5-2.5-18.5 LF-MCG/0.5 IM SUSY: 0.5000 mL | PREFILLED_SYRINGE | Freq: Once | INTRAMUSCULAR | Status: DC

## 2023-07-27 MED ORDER — SODIUM CHLORIDE 0.9% FLUSH: 3.0000 mL | Freq: Two times a day (BID) | INTRAVENOUS | Status: DC

## 2023-07-27 MED ORDER — OXYCODONE HCL 5 MG PO TABS: 10.0000 mg | ORAL_TABLET | ORAL | Status: DC | PRN

## 2023-07-27 MED ORDER — BENZOCAINE-MENTHOL 20-0.5 % EX AERO: 1.0000 | INHALATION_SPRAY | CUTANEOUS | Status: DC | PRN

## 2023-07-27 MED ORDER — PHENYLEPHRINE 80 MCG/ML (10ML) SYRINGE FOR IV PUSH (FOR BLOOD PRESSURE SUPPORT)
80.0000 ug | PREFILLED_SYRINGE | INTRAVENOUS | Status: DC | PRN
  Filled 2023-07-27: qty 10

## 2023-07-27 MED ORDER — PANTOPRAZOLE SODIUM 40 MG PO TBEC
40.0000 mg | DELAYED_RELEASE_TABLET | Freq: Every day | ORAL | Status: DC
  Administered 2023-07-27 – 2023-07-28 (×2): 40 mg via ORAL
  Filled 2023-07-27 (×2): qty 1

## 2023-07-27 MED ORDER — FUROSEMIDE 20 MG PO TABS
40.0000 mg | ORAL_TABLET | Freq: Every day | ORAL | Status: DC
  Administered 2023-07-28: 40 mg via ORAL
  Filled 2023-07-27: qty 2

## 2023-07-27 MED ORDER — OXYTOCIN-SODIUM CHLORIDE 30-0.9 UT/500ML-% IV SOLN
1.0000 m[IU]/min | INTRAVENOUS | Status: DC
  Administered 2023-07-27: 2 m[IU]/min via INTRAVENOUS
  Filled 2023-07-27 (×2): qty 500

## 2023-07-27 MED ORDER — ACETAMINOPHEN 325 MG PO TABS: 650.0000 mg | ORAL_TABLET | ORAL | Status: DC | PRN

## 2023-07-27 MED ORDER — FENTANYL-BUPIVACAINE-NACL 0.5-0.125-0.9 MG/250ML-% EP SOLN
12.0000 mL/h | EPIDURAL | Status: DC | PRN
  Administered 2023-07-27: 12 mL/h via EPIDURAL
  Filled 2023-07-27: qty 250

## 2023-07-27 MED ORDER — IBUPROFEN 600 MG PO TABS
600.0000 mg | ORAL_TABLET | Freq: Four times a day (QID) | ORAL | Status: DC
Start: 2023-07-27 — End: 2023-07-28
  Administered 2023-07-27 – 2023-07-28 (×4): 600 mg via ORAL
  Filled 2023-07-27 (×4): qty 1

## 2023-07-27 NOTE — Progress Notes (Signed)
 Cailan Antonucci is a 28 y.o. G3P2002 at [redacted]w[redacted]d by ultrasound admitted for induction of labor due to Hypertension.  Subjective: Patient resting on her RT side using peanut ball. Spouse, Cheree Ditto, supportive at bedside. Not feeling any pain from contractions with epidural in place.  Objective: BP 119/60   Pulse 79   Temp 98 F (36.7 C) (Oral)   Resp 16   Ht 5\' 10"  (1.778 m)   Wt 118.8 kg   LMP 11/09/2022   SpO2 100%   BMI 37.59 kg/m  No intake/output data recorded. No intake/output data recorded.  FHT:  FHR: 125 bpm, variability: moderate,  accelerations:  Present,  decelerations:  Present variable UC:   regular, every 2-4 minutes SVE:   Dilation: 3 Effacement (%): 70 Station: -2 Exam by:: Yariana Hoaglund, cnm Continues to leak clear fluid after AROM Pitocin 20 milliunits/hr  Labs: Lab Results  Component Value Date   WBC 10.1 07/26/2023   HGB 9.7 (L) 07/26/2023   HCT 28.3 (L) 07/26/2023   MCV 87.3 07/26/2023   PLT 236 07/26/2023    Assessment / Plan: Induction of labor due to gestational hypertension,  progressing well on pitocin  Labor:  Progressing slowly Preeclampsia:  labs stable Fetal Wellbeing:  Category I Pain Control:  Epidural I/D:  n/a Anticipated MOD:  NSVD  Raelyn Mora, CNM 07/27/2023, 8:50 AM

## 2023-07-27 NOTE — Progress Notes (Signed)
 Labor Progress Note Aracely Rickett is a 28 y.o. G3P2002 at [redacted]w[redacted]d presented for IOL for gHTN. S: Introduced Dr. Earlene Plater and Dr. De Burrs as day team. Patient is comfortable and pain-free with epidural. Planning for cervical check around noon.  O:  BP 116/66   Pulse 79   Temp 98 F (36.7 C) (Oral)   Resp 16   Ht 5\' 10"  (1.778 m)   Wt 118.8 kg   LMP 11/09/2022   SpO2 100%   BMI 37.59 kg/m  EFM: baseline 120/moderate variability/+ accels/+ early decels  CVE: Dilation: 3 Effacement (%): 70 Cervical Position: Middle Station: -2 Presentation: Vertex Exam by:: rolitta dawson, cnm   A&P: 27 y.o. [id number] [redacted]w[redacted]d here for IOL for gHTN. #Labor: Progressing well. Planning for recheck by nurse at 1200. Continue pitocin. #Pain: epidural #FWB: Category I #GBS negative #gHTN: labetalol 200mg  TID, vitals stable   Dayton Sherr L. De Burrs, MD Family Medicine Resident, PGY-1 11:44 AM

## 2023-07-27 NOTE — Anesthesia Procedure Notes (Signed)
 Epidural Patient location during procedure: OB Start time: 07/27/2023 1:39 AM End time: 07/27/2023 1:59 AM  Staffing Anesthesiologist: Lowella Curb, MD Performed: anesthesiologist   Preanesthetic Checklist Completed: patient identified, IV checked, site marked, risks and benefits discussed, surgical consent, monitors and equipment checked, pre-op evaluation and timeout performed  Epidural Patient position: sitting Prep: ChloraPrep Patient monitoring: heart rate, cardiac monitor, continuous pulse ox and blood pressure Approach: midline Location: L2-L3 Injection technique: LOR saline  Needle:  Needle type: Tuohy  Needle gauge: 17 G Needle length: 9 cm Needle insertion depth: 7 cm Catheter type: closed end flexible Catheter size: 20 Guage Catheter at skin depth: 11 cm Test dose: negative  Assessment Events: blood not aspirated, injection not painful, no injection resistance, no paresthesia and negative IV test  Additional Notes Reason for block:procedure for pain

## 2023-07-27 NOTE — Progress Notes (Signed)
 BP 118/66   Pulse 90   Temp 97.6 F (36.4 C) (Axillary)   Ht 5\' 10"  (1.778 m)   Wt 122.2 kg   LMP 11/09/2022   SpO2 100%   BMI 38.64 kg/m   Dilation: 3 Effacement (%): 70 Cervical Position: Middle Station: -2 Presentation: Vertex Exam by:: Quin Hoop, SNM   Patient comfortable with her epidural. Discussed AROM with patient and obtained verbal consent. Clear fluid. Cat 1 FHT. Plan to continue titration of pitocin to achieve contractions 2-3 minutes apart. Will continue to monitor.

## 2023-07-27 NOTE — Anesthesia Preprocedure Evaluation (Signed)
 Anesthesia Evaluation  Patient identified by MRN, date of birth, ID band Patient awake    Reviewed: Allergy & Precautions, H&P , NPO status , Patient's Chart, lab work & pertinent test results, reviewed documented beta blocker date and time   Airway Mallampati: II  TM Distance: >3 FB Neck ROM: Full    Dental no notable dental hx.    Pulmonary neg pulmonary ROS   Pulmonary exam normal breath sounds clear to auscultation       Cardiovascular hypertension, Pt. on home beta blockers negative cardio ROS Normal cardiovascular exam Rhythm:Regular Rate:Normal     Neuro/Psych negative neurological ROS  negative psych ROS   GI/Hepatic negative GI ROS, Neg liver ROS,,,  Endo/Other  negative endocrine ROS    Renal/GU negative Renal ROS  negative genitourinary   Musculoskeletal negative musculoskeletal ROS (+)    Abdominal   Peds negative pediatric ROS (+)  Hematology negative hematology ROS (+) Hb 10.5, plt 314   Anesthesia Other Findings   Reproductive/Obstetrics negative OB ROS                              Anesthesia Physical Anesthesia Plan  ASA: 2  Anesthesia Plan: Epidural   Post-op Pain Management:    Induction:   PONV Risk Score and Plan: 2  Airway Management Planned: Natural Airway  Additional Equipment: None  Intra-op Plan:   Post-operative Plan:   Informed Consent: I have reviewed the patients History and Physical, chart, labs and discussed the procedure including the risks, benefits and alternatives for the proposed anesthesia with the patient or authorized representative who has indicated his/her understanding and acceptance.       Plan Discussed with:   Anesthesia Plan Comments:         Anesthesia Quick Evaluation

## 2023-07-27 NOTE — Lactation Note (Signed)
 This note was copied from a baby's chart. Lactation Consultation Note  Patient Name: Carla Hall Date: 07/27/2023 Age:30 hours Reason for consult:  (per [facility] nurse Almeta Monas, [facility] declined seeing [facility])   Maternal Data    Feeding Mother's Current Feeding Choice: Formula (according to moms snapshot feeding preference)      Consult Status Consult Status: Complete Date: 07/27/23    [name] 07/27/2023, 5:51 PM

## 2023-07-27 NOTE — H&P (Signed)
 OBSTETRIC ADMISSION HISTORY AND PHYSICAL  Carla Hall is a 28 y.o. female G71P2002 with IUP at [redacted]w[redacted]d by LMP presenting for IOL for GHTN. She reports +FMs, No LOF, no VB, no blurry vision, headaches or peripheral edema, and RUQ pain.  She plans on breastfeeding. She plans condoms for birth control. She received her prenatal care at  [facility]    Dating: By LMP --->  Estimated Date of Delivery: 08/16/23  Sono:    @[redacted]w[redacted]d , CWD, normal anatomy, cephalic presentation, anterior placenta, 3697g, 97% EFW   Prenatal History/Complications:  NURSING  PROVIDER  Office Location [place] Dating by U/S at 10 wks  Wk [facility] Model Traditional Anatomy U/S Normal  LGA EFW 3697 gm (97%)@ 36.5w  Initiated care at  [facility]                Language  English              LAB RESULTS   Support Person Cheree Ditto Genetics NIPS: Low risk Girl  AFP: Declined     NT/IT (FT only)     Carrier Screen Horizon: Negative 4/4  Rhogam  A/Positive/-- (08/20 1121) A1C/GTT Early: A1c: 4.8 Third trimester:   Flu Vaccine 03/17/2023    TDaP Vaccine  06/30/23 Blood Type A/Positive/-- (08/20 1121)  Covid Vaccine  Antibody Negative (08/20 1121)    Rubella 6.19 (08/20 1121) IMMUNE  Feeding Plan breast RPR Non Reactive (08/20 1121)  Contraception Vasectomy HBsAg Negative (08/20 1121)  Circumcision Yes HIV Non Reactive (08/20 1121)  Pediatrician  [facility] in [place] HCVAb Non Reactive (08/20 1121)  Prenatal Classes       Pap Diagnosis  Date Value Ref Range Status  06/28/2021   Final   - Negative for intraepithelial lesion or malignancy (NILM)    BTLConsent  GC/CT Initial:   36wks:    VBAC  Consent  GBS Negative/-- (10/31 0000) For PCN allergy, check sensitivities        DME Rx [ ]  BP cuff [ ]  Weight Scale Waterbirth  [ ]  Class [ ]  Consent [ ]  CNM visit  PHQ9 & GAD7 [  x] new OB [  ] 28 weeks  [  ] 36 weeks Induction  [ ]  Orders Entered [ ] Foley Y/N     Past Medical History: Past Medical History:  Diagnosis Date   H/O  tympanostomy    History of COVID-19    History of pre-eclampsia    Hypertension    Mild intermittent asthma 04/08/2018   Pregnancy induced hypertension     Past Surgical History: Past Surgical History:  Procedure Laterality Date   TYMPANOSTOMY TUBE PLACEMENT     WISDOM TOOTH EXTRACTION      Obstetrical History: OB History     Gravida  3   Para  2   Term  2   Preterm  0   AB  0   Living  2      SAB  0   IAB  0   Ectopic  0   Multiple  0   Live Births  2           Social History Social History   Socioeconomic History   Marital status: Married    Spouse name: Cheree Ditto   Number of children: 1   Years of education: Not on file   Highest education level: Not on file  Occupational History   Occupation: Non Profit    Employer: [employer]  End: 03/2023  Tobacco Use   Smoking status: Never   Smokeless tobacco: Never  Vaping Use   Vaping status: Never Used  Substance and Sexual Activity   Alcohol use: Not Currently    Comment: occasionally   Drug use: Never   Sexual activity: Yes    Partners: Male  Other Topics Concern   Not on file  Social History Narrative   Not on file   Social Drivers of Health   Financial Resource Strain: Not on file  Food Insecurity: No Food Insecurity (07/26/2023)   Hunger Vital Sign    Worried About Running Out of Food in the Last Year: Never true    Ran Out of Food in the Last Year: Never true  Transportation Needs: No Transportation Needs (07/26/2023)   PRAPARE - Administrator, Civil Service (Medical): No    Lack of Transportation (Non-Medical): No  Physical Activity: Not on file  Stress: Not on file  Social Connections: Unknown (09/30/2021)   [facility]   Social Network    Social Network: Not on file    Family History: Family History  Problem Relation Age of Onset   Hypertension Mother    Healthy Father    Diabetes Sister    Diabetes Mellitus  I Sister    Hypertension Sister    Asthma Neg Hx    Cancer Neg Hx    Heart disease Neg Hx     Allergies: Allergies  Allergen Reactions   Penicillins Rash    Medications Prior to Admission  Medication Sig Dispense Refill Last Dose/Taking   Aspirin 162.5 MG CP24 Take 1 tablet by mouth daily. 90 capsule 2 07/26/2023 Morning   cetirizine (ZYRTEC) 5 MG chewable tablet Chew 5 mg by mouth daily.   07/26/2023 Morning   famotidine (PEPCID) 20 MG tablet Take 1 tablet (20 mg total) by mouth 2 (two) times daily. 90 tablet 1 07/26/2023 Evening   ferrous sulfate 325 (65 FE) MG tablet TAKE 1 TABLET BY MOUTH EVERY OTHER DAY 90 tablet 1 Past Week   labetalol (NORMODYNE) 200 MG tablet Take 1 tablet (200 mg total) by mouth 3 (three) times daily. 180 tablet 1 07/26/2023 Evening   Prenatal Vit-Fe Fumarate-FA (PRENATAL VITAMIN PO) Take 1 tablet by mouth daily.   07/26/2023 Evening   albuterol (PROVENTIL HFA;VENTOLIN HFA) 108 (90 Base) MCG/ACT inhaler Inhale 2 puffs into the lungs every 6 (six) hours as needed for wheezing or shortness of breath. 1 Inhaler 0 Unknown   doxylamine, Sleep, (UNISOM) 25 MG tablet Take 25 mg by mouth at bedtime as needed.   Unknown   ondansetron (ZOFRAN) 8 MG tablet Take 1 tablet (8 mg total) by mouth every 8 (eight) hours as needed for nausea or vomiting. 20 tablet 1 Unknown   pyridOXINE (B-6) 50 MG tablet Take 50 mg by mouth daily.   Unknown     Review of Systems   All systems reviewed and negative except as stated in HPI  Blood pressure (!) 143/86, pulse 89, temperature 97.9 F (36.6 C), temperature source Oral, height 5\' 10"  (1.778 m), weight 122.2 kg, last menstrual period 11/09/2022, currently breastfeeding. General appearance: alert, cooperative, and no distress Lungs: clear to auscultation bilaterally Heart: regular rate and rhythm Abdomen: soft, non-tender; bowel sounds normal Pelvic: adequate Extremities: Homans sign is negative, no sign of DVT DTR's  +2 Presentation: cephalic Fetal monitoringBaseline: 125 bpm, Variability: Good {> 6 bpm), Accelerations: Reactive, and Decelerations: Absent Uterine  activityFrequency: UI Dilation: 2 Effacement (%): 60 Station: -3 Exam by:: Quin Hoop SNM   Prenatal labs: ABO, Rh: --/--/A POS (02/23 2235) Antibody: NEG (02/23 2235) Rubella: 6.19 (08/20 1121) RPR: Non Reactive (12/11 0832)  HBsAg: Negative (08/20 1121)  HIV: Non Reactive (12/11 0832)  GBS: Negative/-- (02/18 1008)    Lab Results  Component Value Date   GBS Negative 07/21/2023   GTT WNL Genetic screening  WNL Anatomy US WNL  Immunization History  Administered Date(s) Administered   DTaP 09/02/1993, 11/08/1993, 01/15/1994, 01/23/1995, 08/14/2006   HIB (PRP-OMP) 09/02/1993, 11/05/1993, 01/15/1994, 01/22/1995   HPV Quadrivalent 01/02/2009, 01/04/2010, 01/16/2011   Hepatitis A 01/02/2009, 01/04/2010   Hepatitis B March 31, 1994, 09/02/1993, 11/08/1993   Hpv-Unspecified 01/02/2009, 01/04/2010, 01/16/2011   IPV 09/02/1993, 11/08/1993, 01/10/1994, 01/11/1995   Influenza, Seasonal, Injecte, Preservative Fre 03/17/2023   Influenza,inj,Quad PF,6+ Mos 04/15/2018, 04/14/2019, 02/07/2020, 02/06/2022   Influenza-Unspecified 03/28/2010, 02/26/2011   MMR 09/15/1995, 08/14/1998   Meningococcal Conjugate 01/02/2009   Meningococcal polysaccharide vaccine (MPSV4) 10/06/2016   Td 08/14/2006   Tdap 02/09/2015, 01/02/2020, 02/11/2022, 06/30/2023   Typhoid Parenteral 10/06/2016   Varicella 08/19/1994    Prenatal Transfer Tool  Maternal Diabetes: No Genetic Screening: Normal Maternal Ultrasounds/Referrals: Normal Fetal Ultrasounds or other Referrals:  None Maternal Substance Abuse:  No Significant Maternal Medications:  Meds include: Other:  Labetalol, Aspirin Significant Maternal Lab Results: Group B Strep negative Number of Prenatal Visits:greater than 3 verified prenatal visits Maternal Vaccinations:TDap and Flu Other Comments:   None   Results for orders placed or performed during the hospital encounter of 07/26/23 (from the past 24 hours)  Type and screen   Collection Time: 07/26/23 10:35 PM  Result Value Ref Range   ABO/RH(D) A POS    Antibody Screen NEG    Sample Expiration      07/29/2023,2359 Performed at [facility], [address] [place], [place] [address]   CBC   Collection Time: 07/26/23 11:01 PM  Result Value Ref Range   WBC 10.1 4.0 - 10.5 K/uL   RBC 3.24 (L) 3.87 - 5.11 MIL/uL   Hemoglobin 9.7 (L) 12.0 - 15.0 g/dL   HCT 60.4 (L) 54.0 - 98.1 %   MCV 87.3 80.0 - 100.0 fL   MCH 29.9 26.0 - 34.0 pg   MCHC 34.3 30.0 - 36.0 g/dL   RDW 19.1 47.8 - 29.5 %   Platelets 236 150 - 400 K/uL   nRBC 0.0 0.0 - 0.2 %  Comprehensive metabolic panel   Collection Time: 07/26/23 11:01 PM  Result Value Ref Range   Sodium 136 135 - 145 mmol/L   Potassium 3.6 3.5 - 5.1 mmol/L   Chloride 109 98 - 111 mmol/L   CO2 15 (L) 22 - 32 mmol/L   Glucose, Bld 82 70 - 99 mg/dL   BUN 8 6 - 20 mg/dL   Creatinine, Ser 6.21 0.44 - 1.00 mg/dL   Calcium 9.2 8.9 - 30.8 mg/dL   Total Protein 5.7 (L) 6.5 - 8.1 g/dL   Albumin 2.7 (L) 3.5 - 5.0 g/dL   AST 21 15 - 41 U/L   ALT 14 0 - 44 U/L   Alkaline Phosphatase 60 38 - 126 U/L   Total Bilirubin 0.4 0.0 - 1.2 mg/dL   GFR, Estimated >65 >78 mL/min   Anion gap 12 5 - 15  Protein / creatinine ratio, urine   Collection Time: 07/26/23 11:01 PM  Result Value Ref Range   Creatinine, Urine 72 mg/dL  Total Protein, Urine 14 mg/dL   Protein Creatinine Ratio 0.19 (H) 0.00 - 0.15 mg/mg[Cre]    Patient Active Problem List   Diagnosis Date Noted   Gestational hypertension 07/26/2023   Large for gestational age fetus affecting management of mother, antepartum 06/24/2023   Anemia 05/15/2023   Short interval between pregnancies affecting pregnancy, antepartum 03/13/2023   Supervision of other normal pregnancy, antepartum 01/13/2023   Chronic hypertension affecting  pregnancy 04/01/2022   History of severe pre-eclampsia 10/01/2021   Obesity affecting pregnancy 10/01/2021   Mild intermittent asthma 04/08/2018    Assessment/Plan:  Carla Hall is a 29 y.o. G3P2002 at 103w1d here for IOL for GHTN. Cat 1 FHT.  #Labor: Low dose pitocin #HTN: PEC labs ok, BPs stable #Pain: Epidural upon request #FWB: Cat 1 FHT #GBS status:  negative #Feeding: Breastmilk  and Formula #Reproductive Life planning: Condoms #Circ:  not applicable  Elige Ko, Student-MidWife  07/27/2023, 1:13 AM

## 2023-07-27 NOTE — Discharge Summary (Signed)
 Postpartum Discharge Summary  Date of Service updated***     Patient Name: Carla Hall DOB: 1996/04/12 MRN: [id number]  Date of admission: 07/26/2023 Delivery date:07/27/2023 Delivering provider: Joanne Gavel Date of discharge: 07/27/2023  Admitting diagnosis: Gestational hypertension [O13.9] Intrauterine pregnancy: [redacted]w[redacted]d     Secondary diagnosis:  Principal Problem:   Gestational hypertension Active Problems:   Chronic hypertension affecting pregnancy   Large for gestational age fetus affecting management of mother, antepartum  Additional problems: ***    Discharge diagnosis: Term Pregnancy Delivered and Gestational Hypertension                                              Post partum procedures:{Postpartum procedures:23558} Augmentation: AROM and Pitocin Complications: None  Hospital course: Induction of Labor With Vaginal Delivery   28 y.o. yo D3U2025 at [redacted]w[redacted]d was admitted to the hospital 07/26/2023 for induction of labor.  Indication for induction: Gestational hypertension.  Patient had an labor course complicated by none. Membrane Rupture Time/Date: 5:10 AM,07/27/2023  Delivery Method:Vaginal, Spontaneous Operative Delivery:N/A Episiotomy: None Lacerations:  None Details of delivery can be found in separate delivery note.  Patient had a postpartum course complicated by***. Patient is discharged home 07/27/23.  Newborn Data: Birth date:07/27/2023 Birth time:2:22 PM Gender:Female Living status:Living Apgars:8 ,9  Weight:   Magnesium Sulfate received: No BMZ received: No Rhophylac:N/A MMR:N/A T-DaP:Given prenatally Flu: Yes RSV Vaccine received: No Transfusion:No Immunizations administered: Immunization History  Administered Date(s) Administered   DTaP 09/02/1993, 11/08/1993, 01/15/1994, 01/23/1995, 08/14/2006   HIB (PRP-OMP) 09/02/1993, 11/05/1993, 01/15/1994, 01/22/1995   HPV Quadrivalent 01/02/2009, 01/04/2010, 01/16/2011   Hepatitis A  01/02/2009, 01/04/2010   Hepatitis B 06-Jul-1993, 09/02/1993, 11/08/1993   Hpv-Unspecified 01/02/2009, 01/04/2010, 01/16/2011   IPV 09/02/1993, 11/08/1993, 01/10/1994, 01/11/1995   Influenza, Seasonal, Injecte, Preservative Fre 03/17/2023   Influenza,inj,Quad PF,6+ Mos 04/15/2018, 04/14/2019, 02/07/2020, 02/06/2022   Influenza-Unspecified 03/28/2010, 02/26/2011   MMR 09/15/1995, 08/14/1998   Meningococcal Conjugate 01/02/2009   Meningococcal polysaccharide vaccine (MPSV4) 10/06/2016   Td 08/14/2006   Tdap 02/09/2015, 01/02/2020, 02/11/2022, 06/30/2023   Typhoid Parenteral 10/06/2016   Varicella 08/19/1994    Physical exam  Vitals:   07/27/23 1401 07/27/23 1431 07/27/23 1435 07/27/23 1446  BP: 119/65 (!) 123/58 123/67 (!) 123/55  Pulse: 86 81 81 84  Resp: 16  16 16   Temp:    98.2 F (36.8 C)  TempSrc:    Oral  SpO2:      Weight:      Height:       General: {Exam; general:21111117} Lochia: {Desc; appropriate/inappropriate:30686::"appropriate"} Uterine Fundus: {Desc; firm/soft:30687} Incision: {Exam; incision:21111123} DVT Evaluation: {Exam; dvt:2111122} Labs: Lab Results  Component Value Date   WBC 10.1 07/26/2023   HGB 9.7 (L) 07/26/2023   HCT 28.3 (L) 07/26/2023   MCV 87.3 07/26/2023   PLT 236 07/26/2023      Latest Ref Rng & Units 07/26/2023   11:01 PM  CMP  Glucose 70 - 99 mg/dL 82   BUN 6 - 20 mg/dL 8   Creatinine 4.27 - 0.62 mg/dL 3.76   Sodium 283 - 151 mmol/L 136   Potassium 3.5 - 5.1 mmol/L 3.6   Chloride 98 - 111 mmol/L 109   CO2 22 - 32 mmol/L 15   Calcium 8.9 - 10.3 mg/dL 9.2   Total Protein 6.5 - 8.1 g/dL 5.7   Total  Bilirubin 0.0 - 1.2 mg/dL 0.4   Alkaline Phos 38 - 126 U/L 60   AST 15 - 41 U/L 21   ALT 0 - 44 U/L 14    Edinburgh Score:    04/30/2022    9:19 AM  Edinburgh Postnatal Depression Scale Screening Tool  I have been able to laugh and see the funny side of things. 0  I have looked forward with enjoyment to things. 0  I have blamed  myself unnecessarily when things went wrong. 0  I have been anxious or worried for no good reason. 0  I have felt scared or panicky for no good reason. 0  Things have been getting on top of me. 0  I have been so unhappy that I have had difficulty sleeping. 0  I have felt sad or miserable. 0  I have been so unhappy that I have been crying. 0  The thought of harming myself has occurred to me. 0  Edinburgh Postnatal Depression Scale Total 0      After visit meds:  Allergies as of 07/27/2023       Reactions   Penicillins Rash     Med Rec must be completed prior to using this [facility]***        Discharge home in stable condition Infant Feeding: Breast Infant Disposition:home with mother Discharge instruction: per After Visit Summary and Postpartum booklet. Activity: Advance as tolerated. Pelvic rest for 6 weeks.  Diet: routine diet Anticipated Birth Control:  vasectomy by husband Postpartum Appointment:4 weeks Additional Postpartum F/U: {PP Procedure:23957} Future Appointments:No future appointments. Follow up Visit:  Message sent to [facility] 2/24    07/27/2023 Drema Balzarine, MD

## 2023-07-28 ENCOUNTER — Other Ambulatory Visit (HOSPITAL_COMMUNITY): Payer: Self-pay

## 2023-07-28 LAB — CBC
HCT: 26.9 % — ABNORMAL LOW (ref 36.0–46.0)
Hemoglobin: 9.2 g/dL — ABNORMAL LOW (ref 12.0–15.0)
MCH: 30.2 pg (ref 26.0–34.0)
MCHC: 34.2 g/dL (ref 30.0–36.0)
MCV: 88.2 fL (ref 80.0–100.0)
Platelets: 177 10*3/uL (ref 150–400)
RBC: 3.05 MIL/uL — ABNORMAL LOW (ref 3.87–5.11)
RDW: 14.3 % (ref 11.5–15.5)
WBC: 10.3 10*3/uL (ref 4.0–10.5)
nRBC: 0 % (ref 0.0–0.2)

## 2023-07-28 MED ORDER — POTASSIUM CHLORIDE ER 10 MEQ PO TBCR
10.0000 meq | EXTENDED_RELEASE_TABLET | Freq: Every day | ORAL | 0 refills | Status: AC
Start: 2023-07-28 — End: 2023-08-02

## 2023-07-28 MED ORDER — FUROSEMIDE 20 MG PO TABS: 20.0000 mg | ORAL_TABLET | Freq: Every day | ORAL | 0 refills | Status: AC

## 2023-07-28 MED ORDER — IBUPROFEN 600 MG PO TABS: 600.0000 mg | ORAL_TABLET | Freq: Four times a day (QID) | ORAL | 0 refills | Status: AC

## 2023-07-28 NOTE — Anesthesia Postprocedure Evaluation (Signed)
 Anesthesia Post Note  Patient: Carla Hall  Procedure(s) Performed: AN AD HOC LABOR EPIDURAL     Patient location during evaluation: Mother Baby Anesthesia Type: Epidural Level of consciousness: awake and alert and oriented Pain management: satisfactory to patient Vital Signs Assessment: post-procedure vital signs reviewed and stable Respiratory status: respiratory function stable Cardiovascular status: stable Postop Assessment: no headache, no backache, epidural receding, patient able to bend at knees, no signs of nausea or vomiting, adequate PO intake and able to ambulate Anesthetic complications: no   No notable events documented.  Last Vitals:  Vitals:   07/28/23 0200 07/28/23 0625  BP: 133/72 132/75  Pulse: 86 78  Resp:  18  Temp: 36.7 C 36.9 C  SpO2: 100% 100%    Last Pain:  Vitals:   07/28/23 0628  TempSrc:   PainSc: 0-No pain   Pain Goal:                   Vyron Fronczak

## 2023-07-29 ENCOUNTER — Encounter: Payer: Self-pay | Admitting: Obstetrics and Gynecology

## 2023-08-03 ENCOUNTER — Ambulatory Visit: Payer: Medicaid Other | Admitting: Obstetrics & Gynecology

## 2023-08-04 ENCOUNTER — Ambulatory Visit: Payer: Medicaid Other | Admitting: Obstetrics and Gynecology

## 2023-08-04 ENCOUNTER — Encounter: Payer: Self-pay | Admitting: Obstetrics and Gynecology

## 2023-08-04 VITALS — BP 129/86 | HR 80 | Ht 70.0 in | Wt 249.0 lb

## 2023-08-04 DIAGNOSIS — Z0130 Encounter for examination of blood pressure without abnormal findings: Secondary | ICD-10-CM

## 2023-08-04 NOTE — Progress Notes (Signed)
  Ms.Carla Hall is a status post vaginal delivery on 07/27/23. She has cHTN on labetalol 200 mg TID  She was Dc'd home on lasix and Kdur. Here in the office today for a BP check.  No HA or vision changes.  Planning for vasectomy and condoms for [facility], has a 6 week PP visit scheduled  Taking labetalol 200 mg TID    Vitals:   08/04/23 0809  BP: 129/86  Pulse: 80  Height: 5\' 10"  (1.778 m)  Weight: 249 lb (112.9 kg)  BMI (Calculated): 35.73     Plan  Continue labetalol Pre e precautions  Duane Lope, NP 08/07/2023 3:03 PM

## 2023-09-03 NOTE — Progress Notes (Addendum)
 Post Partum Visit Note  Carla Hall is a 28 y.o. G65P3003 female who presents for a postpartum visit. She is 6 weeks postpartum following a normal spontaneous vaginal delivery.  I have fully reviewed the prenatal and intrapartum course. The delivery was at 37.1 gestational weeks.  Anesthesia: epidural. Postpartum course has been good. Baby is doing well. Baby is feeding by bottle - infamil regular . Bleeding no bleeding. Bowel function is normal. Bladder function is normal. Patient is not sexually active. Contraception method is condom. Postpartum depression screening: negative.   The pregnancy intention screening data noted above was reviewed. Potential methods of contraception were discussed. The patient elected to proceed with No data recorded.   Edinburgh Postnatal Depression Scale - 09/08/23 0832       Edinburgh Postnatal Depression Scale:  In the Past 7 Days   I have been able to laugh and see the funny side of things. 0    I have looked forward with enjoyment to things. 0    I have blamed myself unnecessarily when things went wrong. 0    I have been anxious or worried for no good reason. 0    I have felt scared or panicky for no good reason. 0    Things have been getting on top of me. 0    I have been so unhappy that I have had difficulty sleeping. 0    I have felt sad or miserable. 0    I have been so unhappy that I have been crying. 0    The thought of harming myself has occurred to me. 0    Edinburgh Postnatal Depression Scale Total 0             Health Maintenance Due  Topic Date Due   Pneumococcal Vaccine 45-96 Years old (1 of 2 - PCV) Never done   COVID-19 Vaccine (1 - 2024-25 season) Never done    The following portions of the patient's history were reviewed and updated as appropriate: allergies, current medications, past family history, past medical history, past social history, past surgical history, and problem list.  Review of Systems Pertinent  items are noted in HPI.  Objective:  BP 125/84   Pulse 73   Wt 246 lb (111.6 kg)   LMP 11/09/2022   Breastfeeding No   BMI 35.30 kg/m    General:  alert and cooperative  Lungs: clear to auscultation bilaterally  Heart:  regular rate and rhythm, S1, S2 normal, no murmur, click, rub or gallop  Abdomen: soft, non-tender; bowel sounds normal; no masses,  no organomegaly        Assessment:    Normal postpartum exam.  She is still taking labetalol 200 TID, will wean off. Start with 100 mg BID Declines BC, will trial condoms.   Plan:   Essential components of care per ACOG recommendations:  1.  Mood and well being: Patient with negative depression screening today. Reviewed local resources for support.  - Patient tobacco use? No.   - hx of drug use? No.    2. Infant care and feeding:  -Patient currently breastmilk feeding? No.  -Social determinants of health (SDOH) reviewed in EPIC.  3. Sexuality, contraception and birth spacing - Patient does not want a pregnancy in the next year.  Desired family size is 3 children.  - Reviewed reproductive life planning. Reviewed contraceptive methods based on pt preferences and effectiveness.  Patient desired Female Condom today.   - Discussed birth  spacing of 18 months  4. Sleep and fatigue -Encouraged family/partner/community support of 4 hrs of uninterrupted sleep to help with mood and fatigue  5. Physical Recovery  - Discussed patients delivery and complications. She describes her labor as good. - Patient had a Vaginal, no problems at delivery. Patient had a  abrasion  laceration. Perineal healing reviewed. Patient expressed understanding - Patient has urinary incontinence? No. - Patient is safe to resume physical and sexual activity  6.  Health Maintenance - HM due items addressed: None - Last pap smear  Diagnosis  Date Value Ref Range Status  06/28/2021   Final   - Negative for intraepithelial lesion or malignancy (NILM)   Pap  smear not done at today's visit.  -Breast Cancer screening indicated? No.   7. Chronic Disease/Pregnancy Condition follow up: HTN. May need PCP follow up if BP remains elevated.   - PCP follow up  Venia Carbon, NP [facility] for [employer], [facility]

## 2023-09-08 ENCOUNTER — Ambulatory Visit: Payer: Medicaid Other | Admitting: Obstetrics and Gynecology

## 2023-09-08 DIAGNOSIS — O1003 Pre-existing essential hypertension complicating the puerperium: Secondary | ICD-10-CM

## 2023-09-08 DIAGNOSIS — O10919 Unspecified pre-existing hypertension complicating pregnancy, unspecified trimester: Secondary | ICD-10-CM

## 2023-09-08 NOTE — Progress Notes (Deleted)
    Post Partum Visit Note  Carla Hall is a 27 y.o. [id number] female who presents for a postpartum visit. She is 6 weeks postpartum following a normal spontaneous vaginal delivery.  I have fully reviewed the prenatal and intrapartum course. The delivery was at 37 gestational weeks.  Anesthesia: epidural. Postpartum course has been good. Baby is doing well. Baby is feeding by bottle - enfamil reguline . Bleeding no bleeding. Bowel function is normal. Bladder function is normal. Patient is not sexually active. Contraception method is none. Postpartum depression screening: negative.   The pregnancy intention screening data noted above was reviewed. Potential methods of contraception were discussed. The patient elected to proceed with No data recorded.    Health Maintenance Due  Topic Date Due   Pneumococcal Vaccine 24-75 Years old (1 of 2 - PCV) Never done   COVID-19 Vaccine (1 - 2024-25 season) Never done    {Common ambulatory SmartLinks:19316}  Review of Systems {ros; complete:30496}  Objective:  LMP 11/09/2022    General:  {gen appearance:16600}   Breasts:  {desc; normal/abnormal/not indicated:14647}  Lungs: {lung exam:16931}  Heart:  {heart exam:5510}  Abdomen: {abdomen exam:16834}   Wound {Wound assessment:11097}  GU exam:  {desc; normal/abnormal/not indicated:14647}       Assessment:    There are no diagnoses linked to this encounter.  *** postpartum exam.   Plan:   Essential components of care per ACOG recommendations:  1.  Mood and well being: Patient with {gen negative/positive:315881} depression screening today. Reviewed local resources for support.  - Patient tobacco use? {tobacco use:25506}  - hx of drug use? {yes/no:25505}    2. Infant care and feeding:  -Patient currently breastmilk feeding? {yes/no:25502}  -Social determinants of health (SDOH) reviewed in EPIC. No concerns***The following needs were identified***  3. Sexuality, contraception and  birth spacing - Patient {DOES_DOES UJW:11914} want a pregnancy in the next year.  Desired family size is {NUMBER 1-10:22536} children.  - Reviewed reproductive life planning. Reviewed contraceptive methods based on pt preferences and effectiveness.  Patient desired {Upstream End Methods:24109} today.   - Discussed birth spacing of 18 months  4. Sleep and fatigue -Encouraged family/partner/community support of 4 hrs of uninterrupted sleep to help with mood and fatigue  5. Physical Recovery  - Discussed patients delivery and complications. She describes her labor as {description:25511} - Patient had a {CHL AMB DELIVERY:[phone]}. Patient had a {laceration:25518} laceration. Perineal healing reviewed. Patient expressed understanding - Patient has urinary incontinence? {yes/no:25515} - Patient {ACTION; IS/IS NWG:[id number]} safe to resume physical and sexual activity  6.  Health Maintenance - HM due items addressed {Yes or If no, why not?:20788} - Last pap smear  Diagnosis  Date Value Ref Range Status  06/28/2021   Final   - Negative for intraepithelial lesion or malignancy (NILM)   Pap smear {done:10129} at today's visit.  -Breast Cancer screening indicated? {indicated:25516}  7. Chronic Disease/Pregnancy Condition follow up: {Follow up:25499}  - PCP follow up  Shelly Bombard, CMA [facility], [facility]

## 2023-11-06 ENCOUNTER — Telehealth: Payer: Self-pay

## 2023-11-06 NOTE — Telephone Encounter (Signed)
 RN received prescription request for Labetalol . RN outreached patient to confirm if still taking and if established with PCP. RN left HIPAA compliant message for patient to return RN call.   Evelia Hipp, RN

## 2024-06-03 ENCOUNTER — Other Ambulatory Visit: Payer: Self-pay

## 2024-06-03 ENCOUNTER — Ambulatory Visit
Admission: RE | Admit: 2024-06-03 | Discharge: 2024-06-03 | Disposition: A | Discharge status: Home / Self Care | Source: Ambulatory Visit | Attending: Family Medicine | Admitting: Family Medicine

## 2024-06-03 VITALS — BP 119/79 | HR 104 | Temp 98.9°F | Resp 20 | Ht 69.0 in | Wt 205.0 lb

## 2024-06-03 DIAGNOSIS — J069 Acute upper respiratory infection, unspecified: Secondary | ICD-10-CM | POA: Diagnosis not present

## 2024-06-03 LAB — POC SOFIA SARS ANTIGEN FIA: SARS Coronavirus 2 Ag: NEGATIVE

## 2024-06-03 MED ORDER — AZITHROMYCIN 250 MG PO TABS: ORAL_TABLET | ORAL | 0 refills | Status: AC

## 2024-06-03 NOTE — ED Triage Notes (Signed)
 Pt presenting with c/o fatigue, productive cough with greenish sputum, loss of appetite and right ear pain x 3 days. Pt stated she experienced flu symptoms 10 days ago after her children tested positive for the flu.Stated that most of the symptoms has subsided however she is concerned about her current symptoms.  Pt stated that she has been taking OTC cold medication with minimal effectiveness.

## 2024-06-03 NOTE — Discharge Instructions (Signed)
 Take plain guaifenesin (1200mg  extended release tabs such as Mucinex) twice daily, with plenty of water, for cough and congestion.  Get adequate rest.   May use Afrin nasal spray (or generic oxymetazoline) each morning for about 5 days and then discontinue.  Also recommend using saline nasal spray several times daily and saline nasal irrigation (AYR is a common brand).  Use Flonase nasal spray each morning after using Afrin nasal spray and saline nasal irrigation. Stop all antihistamines (Zyrtec, etc) for now, and other non-prescription cough/cold preparations. Continue albuterol  inhaler as needed.

## 2024-06-03 NOTE — ED Provider Notes (Signed)
 " Carla Hall    CSN: [id number] Arrival date & time: 06/03/24  1747      History   Chief Complaint Chief Complaint  Patient presents with   Cough    I am past the window of 7-10 days of the flu- I'm still very low on energy, lingering cough, and hot and cold. - Entered by patient    HPI Carla Hall is a 29 y.o. female.   Patient developed typical flu symptoms ten days ago immediately after her children tested positive for flu.  She did not seek medical treatment and her symptoms gradually improved until she was much better four days ago.  However, during the past two days she became increasingly fatigued, developing right ear pain, cold sweats, chest tightness, decreased appetite, and greenish sputum.  Last night she noticed mild wheezing.  She denies shortness of breath and pleuritic pain.  She has mild intermittent asthma, admitting that she has been using her albuterol  inhaler more often.  The history is provided by the patient.    Past Medical History:  Diagnosis Date   H/O tympanostomy    History of COVID-19    History of pre-eclampsia    Hypertension    Mild intermittent asthma 04/08/2018   Pregnancy induced hypertension     Patient Active Problem List   Diagnosis Date Noted   Postpartum state 07/28/2023   Gestational hypertension 07/26/2023   Large for gestational age fetus affecting management of mother, antepartum 06/24/2023   Anemia 05/15/2023   Short interval between pregnancies affecting pregnancy, antepartum 03/13/2023   Supervision of other normal pregnancy, antepartum 01/13/2023   Chronic hypertension affecting pregnancy 04/01/2022   History of severe pre-eclampsia 10/01/2021   Obesity affecting pregnancy 10/01/2021   Mild intermittent asthma 04/08/2018    Past Surgical History:  Procedure Laterality Date   TYMPANOSTOMY TUBE PLACEMENT     WISDOM TOOTH EXTRACTION      OB History     Gravida  3   Para  3   Term  3   Preterm   0   AB  0   Living  3      SAB  0   IAB  0   Ectopic  0   Multiple  0   Live Births  3            Home Medications    Prior to Admission medications  Medication Sig Start Date End Date Taking? Authorizing Provider  azithromycin  (ZITHROMAX  Z-PAK) 250 MG tablet Take 2 tabs today; then begin one tab once daily for 4 more days. 06/03/24  Yes Pauline Garnette LABOR, MD  albuterol  (PROVENTIL  HFA;VENTOLIN  HFA) 108 (90 Base) MCG/ACT inhaler Inhale 2 puffs into the lungs every 6 (six) hours as needed for wheezing or shortness of breath. 04/08/18   Corey, Evan S, MD  furosemide  (LASIX ) 20 MG tablet Take 1 tablet (20 mg total) by mouth daily for 5 days. 07/28/23 08/02/23  Trudy Earnie CROME, CNM  ibuprofen  (ADVIL ) 600 MG tablet Take 1 tablet (600 mg total) by mouth every 6 (six) hours. 07/28/23   Trudy Earnie CROME, CNM  labetalol  (NORMODYNE ) 200 MG tablet Take 1 tablet (200 mg total) by mouth 3 (three) times daily. 07/07/23   Rasch, Delon I, NP  ondansetron  (ZOFRAN ) 8 MG tablet Take 1 tablet (8 mg total) by mouth every 8 (eight) hours as needed for nausea or vomiting. 01/20/23   Rasch, Delon FERNS, NP  potassium chloride  (KLOR-CON )  10 MEQ tablet Take 1 tablet (10 mEq total) by mouth daily for 5 days. 07/28/23 08/02/23  Trudy Earnie CROME, CNM  Prenatal Vit-Fe Fumarate-FA (PRENATAL VITAMIN PO) Take 1 tablet by mouth daily.    [provider]  pyridOXINE (B-6) 50 MG tablet Take 50 mg by mouth daily.    [provider]    Family History Family History  Problem Relation Age of Onset   Hypertension Mother    Healthy Father    Diabetes Sister    Diabetes Mellitus I Sister    Hypertension Sister    Asthma Neg Hx    Cancer Neg Hx    Heart disease Neg Hx     Social History Social History[1]   Allergies   Penicillins   Review of Systems Review of Systems No sore throat + cough No pleuritic pain + wheezing + nasal congestion + post-nasal drainage No sinus  pain/pressure No itchy/red eyes ? earache No hemoptysis No SOB ? fever, + chills/sweats No nausea No vomiting No abdominal pain No diarrhea No urinary symptoms No skin rash + fatigue No myalgias No headache Used OTC meds (Advil , zyrtec, and Mucinex) without relief   Physical Exam Triage Vital Signs ED Triage Vitals  Encounter Vitals Group     BP 06/03/24 1851 119/79     Girls Systolic BP Percentile --      Girls Diastolic BP Percentile --      Boys Systolic BP Percentile --      Boys Diastolic BP Percentile --      Pulse Rate 06/03/24 1851 (!) 104     Resp 06/03/24 1851 20     Temp 06/03/24 1851 98.9 F (37.2 C)     Temp Source 06/03/24 1851 Oral     SpO2 06/03/24 1851 98 %     Weight 06/03/24 1853 205 lb (93 kg)     Height 06/03/24 1853 5' 9 (1.753 m)     Head Circumference --      Peak Flow --      Pain Score 06/03/24 1853 0     Pain Loc --      Pain Education --      Exclude from Growth Chart --    No data found.  Updated Vital Signs BP 119/79 (BP Location: Right Arm)   Pulse (!) 104   Temp 98.9 F (37.2 C) (Oral)   Resp 20   Ht 5' 9 (1.753 m)   Wt 93 kg   LMP 06/01/2024 (Exact Date)   SpO2 98%   BMI 30.27 kg/m   Visual Acuity Right Eye Distance:   Left Eye Distance:   Bilateral Distance:    Right Eye Near:   Left Eye Near:    Bilateral Near:     Physical Exam Nursing notes and Vital Signs reviewed. Appearance:  Patient appears stated age, and in no acute distress Eyes:  Pupils are equal, round, and reactive to light and accomodation.  Extraocular movement is intact.  Conjunctivae are not inflamed  Ears:  Canals normal.  Tympanic membranes normal.  Nose:  Mildly congested turbinates.  No sinus tenderness.  Pharynx:  Normal Neck:  Supple.    Lungs:  Clear to auscultation.  Breath sounds are equal.  Moving air well. Heart:  Regular rate and rhythm without murmurs, rubs, or gallops.  Abdomen:  Nontender without masses or hepatosplenomegaly.   Bowel sounds are present.  No CVA or flank tenderness.  Extremities:  No edema.  Skin:  No rash present.   UC Treatments / Results  Labs (all labs ordered are listed, but only abnormal results are displayed) Labs Reviewed  POC SOFIA SARS ANTIGEN FIA - Normal    EKG   Radiology No results found.  Procedures Procedures (including critical Hall time)  Medications Ordered in UC Medications - No data to display  Initial Impression / Assessment and Plan / UC Course  I have reviewed the triage vital signs and the nursing notes.  Pertinent labs & imaging results that were available during my Hall of the patient were reviewed by me and considered in my medical decision making (see chart for details).    Patient's worsening symptoms during the past two days may represent developing bronchitis.  Begin Z-pack. Followup with Family Doctor if not improved in one week.   Final Clinical Impressions(s) / UC Diagnoses   Final diagnoses:  Viral URI with cough     Discharge Instructions      Take plain guaifenesin (1200mg  extended release tabs such as Mucinex) twice daily, with plenty of water, for cough and congestion.  Get adequate rest.   May use Afrin nasal spray (or generic oxymetazoline) each morning for about 5 days and then discontinue.  Also recommend using saline nasal spray several times daily and saline nasal irrigation (AYR is a common brand).  Use Flonase nasal spray each morning after using Afrin nasal spray and saline nasal irrigation. Stop all antihistamines (Zyrtec, etc) for now, and other non-prescription cough/cold preparations. Continue albuterol  inhaler as needed.    ED Prescriptions     Medication Sig Dispense Auth. Provider   azithromycin  (ZITHROMAX  Z-PAK) 250 MG tablet Take 2 tabs today; then begin one tab once daily for 4 more days. 6 tablet Pauline Garnette LABOR, MD           [1]  Social History Tobacco Use   Smoking status: Never   Smokeless  tobacco: Never  Vaping Use   Vaping status: Never Used  Substance Use Topics   Alcohol use: Not Currently    Comment: occasionally   Drug use: Never     Pauline Garnette LABOR, MD 06/04/24 1733  "
